# Patient Record
Sex: Male | Born: 1960 | Race: White | Hispanic: No | Smoking: Never smoker
Health system: Southern US, Community
[De-identification: ages and names within clinical notes are randomized; demographics above are authoritative.]

## PROBLEM LIST (undated history)

## (undated) DIAGNOSIS — IMO0001 Reserved for inherently not codable concepts without codable children: Secondary | ICD-10-CM

## (undated) DIAGNOSIS — I10 Essential (primary) hypertension: Secondary | ICD-10-CM

## (undated) DIAGNOSIS — L02512 Cutaneous abscess of left hand: Secondary | ICD-10-CM

## (undated) DIAGNOSIS — S68119A Complete traumatic metacarpophalangeal amputation of unspecified finger, initial encounter: Secondary | ICD-10-CM

## (undated) DIAGNOSIS — M659 Synovitis and tenosynovitis, unspecified: Secondary | ICD-10-CM

## (undated) DIAGNOSIS — M1990 Unspecified osteoarthritis, unspecified site: Secondary | ICD-10-CM

## (undated) DIAGNOSIS — R7881 Bacteremia: Secondary | ICD-10-CM

## (undated) DIAGNOSIS — E538 Deficiency of other specified B group vitamins: Secondary | ICD-10-CM

## (undated) DIAGNOSIS — E119 Type 2 diabetes mellitus without complications: Secondary | ICD-10-CM

## (undated) DIAGNOSIS — I639 Cerebral infarction, unspecified: Secondary | ICD-10-CM

## (undated) DIAGNOSIS — M869 Osteomyelitis, unspecified: Secondary | ICD-10-CM

## (undated) DIAGNOSIS — R011 Cardiac murmur, unspecified: Secondary | ICD-10-CM

## (undated) HISTORY — PX: TONSILLECTOMY: SUR1361

## (undated) HISTORY — DX: Complete traumatic metacarpophalangeal amputation of unspecified finger, initial encounter: S68.119A

## (undated) HISTORY — DX: Deficiency of other specified B group vitamins: E53.8

## (undated) HISTORY — DX: Bacteremia: R78.81

## (undated) HISTORY — DX: Reserved for inherently not codable concepts without codable children: IMO0001

## (undated) HISTORY — PX: CATARACT EXTRACTION W/ INTRAOCULAR LENS  IMPLANT, BILATERAL: SHX1307

## (undated) HISTORY — DX: Cutaneous abscess of left hand: L02.512

## (undated) HISTORY — DX: Synovitis and tenosynovitis, unspecified: M65.9

---
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
## 1998-04-07 ENCOUNTER — Encounter: Payer: Self-pay | Admitting: Internal Medicine

## 1998-04-07 ENCOUNTER — Inpatient Hospital Stay (HOSPITAL_COMMUNITY): Admission: EM | Admit: 1998-04-07 | Discharge: 1998-04-14 | Payer: Self-pay | Admitting: Internal Medicine

## 1998-04-08 ENCOUNTER — Encounter: Payer: Self-pay | Admitting: *Deleted

## 1998-04-14 ENCOUNTER — Inpatient Hospital Stay (HOSPITAL_COMMUNITY)
Admission: RE | Admit: 1998-04-14 | Discharge: 1998-05-05 | Payer: Self-pay | Admitting: Physical Medicine and Rehabilitation

## 1998-05-07 ENCOUNTER — Encounter
Admission: RE | Admit: 1998-05-07 | Discharge: 1998-08-05 | Payer: Self-pay | Admitting: Physical Medicine and Rehabilitation

## 1998-08-02 ENCOUNTER — Encounter
Admission: RE | Admit: 1998-08-02 | Discharge: 1998-10-31 | Payer: Self-pay | Admitting: Physical Medicine and Rehabilitation

## 1998-12-20 ENCOUNTER — Encounter: Admission: RE | Admit: 1998-12-20 | Discharge: 1999-03-20 | Payer: Self-pay | Admitting: *Deleted

## 1999-04-25 ENCOUNTER — Encounter: Admission: RE | Admit: 1999-04-25 | Discharge: 1999-07-24 | Payer: Self-pay | Admitting: *Deleted

## 1999-06-21 ENCOUNTER — Encounter: Payer: Self-pay | Admitting: *Deleted

## 1999-06-21 ENCOUNTER — Inpatient Hospital Stay (HOSPITAL_COMMUNITY): Admission: AD | Admit: 1999-06-21 | Discharge: 1999-06-22 | Payer: Self-pay | Admitting: *Deleted

## 2000-06-28 ENCOUNTER — Ambulatory Visit (HOSPITAL_COMMUNITY): Admission: RE | Admit: 2000-06-28 | Discharge: 2000-06-28 | Payer: Self-pay | Admitting: *Deleted

## 2000-12-19 ENCOUNTER — Encounter: Admission: RE | Admit: 2000-12-19 | Discharge: 2000-12-19 | Payer: Self-pay | Admitting: *Deleted

## 2000-12-19 ENCOUNTER — Encounter: Payer: Self-pay | Admitting: *Deleted

## 2004-07-19 ENCOUNTER — Emergency Department (HOSPITAL_COMMUNITY): Admission: AC | Admit: 2004-07-19 | Discharge: 2004-07-19 | Payer: Self-pay

## 2012-03-06 DIAGNOSIS — H3500 Unspecified background retinopathy: Secondary | ICD-10-CM | POA: Diagnosis not present

## 2012-03-06 DIAGNOSIS — E119 Type 2 diabetes mellitus without complications: Secondary | ICD-10-CM | POA: Diagnosis not present

## 2012-03-06 DIAGNOSIS — Z961 Presence of intraocular lens: Secondary | ICD-10-CM | POA: Diagnosis not present

## 2012-12-03 ENCOUNTER — Encounter (HOSPITAL_COMMUNITY): Payer: Self-pay | Admitting: Emergency Medicine

## 2012-12-03 ENCOUNTER — Emergency Department (HOSPITAL_COMMUNITY): Payer: Medicare Other

## 2012-12-03 ENCOUNTER — Inpatient Hospital Stay (HOSPITAL_COMMUNITY)
Admission: EM | Admit: 2012-12-03 | Discharge: 2012-12-12 | DRG: 854 | Disposition: A | Discharge status: Skilled Nursing Facility | Payer: Medicare Other | Attending: Family Medicine | Admitting: Family Medicine

## 2012-12-03 DIAGNOSIS — M719 Bursopathy, unspecified: Secondary | ICD-10-CM | POA: Diagnosis not present

## 2012-12-03 DIAGNOSIS — L03818 Cellulitis of other sites: Secondary | ICD-10-CM | POA: Diagnosis not present

## 2012-12-03 DIAGNOSIS — S61209A Unspecified open wound of unspecified finger without damage to nail, initial encounter: Secondary | ICD-10-CM | POA: Diagnosis not present

## 2012-12-03 DIAGNOSIS — T360X5A Adverse effect of penicillins, initial encounter: Secondary | ICD-10-CM | POA: Diagnosis present

## 2012-12-03 DIAGNOSIS — E119 Type 2 diabetes mellitus without complications: Secondary | ICD-10-CM

## 2012-12-03 DIAGNOSIS — Z1810 Retained metal fragments, unspecified: Secondary | ICD-10-CM

## 2012-12-03 DIAGNOSIS — R000 Tachycardia, unspecified: Secondary | ICD-10-CM | POA: Diagnosis present

## 2012-12-03 DIAGNOSIS — S6990XA Unspecified injury of unspecified wrist, hand and finger(s), initial encounter: Secondary | ICD-10-CM | POA: Diagnosis not present

## 2012-12-03 DIAGNOSIS — Z79899 Other long term (current) drug therapy: Secondary | ICD-10-CM

## 2012-12-03 DIAGNOSIS — I6990 Unspecified sequelae of unspecified cerebrovascular disease: Secondary | ICD-10-CM

## 2012-12-03 DIAGNOSIS — B964 Proteus (mirabilis) (morganii) as the cause of diseases classified elsewhere: Secondary | ICD-10-CM | POA: Diagnosis present

## 2012-12-03 DIAGNOSIS — E118 Type 2 diabetes mellitus with unspecified complications: Secondary | ICD-10-CM

## 2012-12-03 DIAGNOSIS — F1010 Alcohol abuse, uncomplicated: Secondary | ICD-10-CM | POA: Diagnosis present

## 2012-12-03 DIAGNOSIS — L02512 Cutaneous abscess of left hand: Secondary | ICD-10-CM

## 2012-12-03 DIAGNOSIS — M6790 Unspecified disorder of synovium and tendon, unspecified site: Secondary | ICD-10-CM | POA: Diagnosis not present

## 2012-12-03 DIAGNOSIS — L03019 Cellulitis of unspecified finger: Secondary | ICD-10-CM | POA: Diagnosis present

## 2012-12-03 DIAGNOSIS — I69920 Aphasia following unspecified cerebrovascular disease: Secondary | ICD-10-CM

## 2012-12-03 DIAGNOSIS — R4701 Aphasia: Secondary | ICD-10-CM | POA: Diagnosis present

## 2012-12-03 DIAGNOSIS — M7150 Other bursitis, not elsewhere classified, unspecified site: Secondary | ICD-10-CM | POA: Diagnosis not present

## 2012-12-03 DIAGNOSIS — Z7982 Long term (current) use of aspirin: Secondary | ICD-10-CM

## 2012-12-03 DIAGNOSIS — M009 Pyogenic arthritis, unspecified: Secondary | ICD-10-CM | POA: Diagnosis not present

## 2012-12-03 DIAGNOSIS — M65839 Other synovitis and tenosynovitis, unspecified forearm: Secondary | ICD-10-CM | POA: Diagnosis present

## 2012-12-03 DIAGNOSIS — R29898 Other symptoms and signs involving the musculoskeletal system: Secondary | ICD-10-CM | POA: Diagnosis present

## 2012-12-03 DIAGNOSIS — L270 Generalized skin eruption due to drugs and medicaments taken internally: Secondary | ICD-10-CM | POA: Diagnosis present

## 2012-12-03 DIAGNOSIS — S6980XA Other specified injuries of unspecified wrist, hand and finger(s), initial encounter: Secondary | ICD-10-CM | POA: Diagnosis not present

## 2012-12-03 DIAGNOSIS — I69998 Other sequelae following unspecified cerebrovascular disease: Secondary | ICD-10-CM

## 2012-12-03 DIAGNOSIS — F840 Autistic disorder: Secondary | ICD-10-CM | POA: Diagnosis present

## 2012-12-03 DIAGNOSIS — IMO0001 Reserved for inherently not codable concepts without codable children: Secondary | ICD-10-CM | POA: Diagnosis present

## 2012-12-03 DIAGNOSIS — E1165 Type 2 diabetes mellitus with hyperglycemia: Secondary | ICD-10-CM | POA: Diagnosis present

## 2012-12-03 DIAGNOSIS — M7989 Other specified soft tissue disorders: Secondary | ICD-10-CM | POA: Diagnosis not present

## 2012-12-03 DIAGNOSIS — T50905A Adverse effect of unspecified drugs, medicaments and biological substances, initial encounter: Secondary | ICD-10-CM

## 2012-12-03 DIAGNOSIS — L03114 Cellulitis of left upper limb: Secondary | ICD-10-CM

## 2012-12-03 DIAGNOSIS — I10 Essential (primary) hypertension: Secondary | ICD-10-CM | POA: Diagnosis present

## 2012-12-03 DIAGNOSIS — S61409A Unspecified open wound of unspecified hand, initial encounter: Secondary | ICD-10-CM | POA: Diagnosis present

## 2012-12-03 DIAGNOSIS — E785 Hyperlipidemia, unspecified: Secondary | ICD-10-CM | POA: Diagnosis present

## 2012-12-03 DIAGNOSIS — M6281 Muscle weakness (generalized): Secondary | ICD-10-CM | POA: Diagnosis present

## 2012-12-03 DIAGNOSIS — R29810 Facial weakness: Secondary | ICD-10-CM | POA: Diagnosis present

## 2012-12-03 DIAGNOSIS — Y9210 Unspecified residential institution as the place of occurrence of the external cause: Secondary | ICD-10-CM | POA: Diagnosis present

## 2012-12-03 DIAGNOSIS — L02519 Cutaneous abscess of unspecified hand: Secondary | ICD-10-CM | POA: Diagnosis present

## 2012-12-03 DIAGNOSIS — I498 Other specified cardiac arrhythmias: Secondary | ICD-10-CM | POA: Diagnosis not present

## 2012-12-03 DIAGNOSIS — R634 Abnormal weight loss: Secondary | ICD-10-CM | POA: Diagnosis present

## 2012-12-03 DIAGNOSIS — M659 Synovitis and tenosynovitis, unspecified: Secondary | ICD-10-CM | POA: Diagnosis present

## 2012-12-03 DIAGNOSIS — L089 Local infection of the skin and subcutaneous tissue, unspecified: Secondary | ICD-10-CM

## 2012-12-03 DIAGNOSIS — X58XXXA Exposure to other specified factors, initial encounter: Secondary | ICD-10-CM

## 2012-12-03 DIAGNOSIS — A419 Sepsis, unspecified organism: Principal | ICD-10-CM | POA: Diagnosis present

## 2012-12-03 DIAGNOSIS — M65849 Other synovitis and tenosynovitis, unspecified hand: Secondary | ICD-10-CM | POA: Diagnosis present

## 2012-12-03 DIAGNOSIS — L02818 Cutaneous abscess of other sites: Secondary | ICD-10-CM | POA: Diagnosis not present

## 2012-12-03 HISTORY — DX: Type 2 diabetes mellitus without complications: E11.9

## 2012-12-03 HISTORY — DX: Cerebral infarction, unspecified: I63.9

## 2012-12-03 LAB — COMPREHENSIVE METABOLIC PANEL
AST: 9 U/L (ref 0–37)
Albumin: 2.8 g/dL — ABNORMAL LOW (ref 3.5–5.2)
Alkaline Phosphatase: 114 U/L (ref 39–117)
BUN: 16 mg/dL (ref 6–23)
BUN: 12 mg/dL (ref 6–23)
Calcium: 8.9 mg/dL (ref 8.4–10.5)
Chloride: 97 mEq/L (ref 96–112)
Chloride: 100 mEq/L (ref 96–112)
Creatinine, Ser: 1.02 mg/dL (ref 0.50–1.35)
Creatinine, Ser: 0.72 mg/dL (ref 0.50–1.35)
GFR calc Af Amer: >90 mL/min (ref >90)
GFR calc non Af Amer: 83 mL/min — ABNORMAL LOW (ref >90)
Glucose, Bld: 410 mg/dL — ABNORMAL HIGH (ref 70–99)
Potassium: 3.9 mEq/L (ref 3.5–5.1)
Total Bilirubin: 0.3 mg/dL (ref 0.3–1.2)
Total Bilirubin: 0.3 mg/dL (ref 0.3–1.2)
Total Protein: 6.3 g/dL (ref 6.0–8.3)

## 2012-12-03 LAB — CBC
Platelets: 234 10*3/uL (ref 150–400)
RBC: 4.04 MIL/uL — ABNORMAL LOW (ref 4.22–5.81)
RDW: 12.8 % (ref 11.5–15.5)
WBC: 10.8 10*3/uL — ABNORMAL HIGH (ref 4.0–10.5)

## 2012-12-03 LAB — CBC WITH DIFFERENTIAL/PLATELET
Basophils Absolute: 0 10*3/uL (ref 0.0–0.1)
Basophils Relative: 0 % (ref 0–1)
Eosinophils Relative: 1 % (ref 0–5)
HCT: 39.5 % (ref 39.0–52.0)
Hemoglobin: 13.8 g/dL (ref 13.0–17.0)
MCH: 29.7 pg (ref 26.0–34.0)
MCHC: 34.9 g/dL (ref 30.0–36.0)
MCV: 85.1 fL (ref 78.0–100.0)
Monocytes Absolute: 0.9 10*3/uL (ref 0.1–1.0)
Monocytes Relative: 11 % (ref 3–12)
Neutro Abs: 5.6 10*3/uL (ref 1.7–7.7)
RDW: 12.7 % (ref 11.5–15.5)

## 2012-12-03 LAB — SEDIMENTATION RATE: Sed Rate: 70 mm/hr — ABNORMAL HIGH (ref 0–16)

## 2012-12-03 LAB — CG4 I-STAT (LACTIC ACID): Lactic Acid, Venous: 1.64 mmol/L (ref 0.5–2.2)

## 2012-12-03 MED ORDER — ACETAMINOPHEN 325 MG PO TABS
650.0000 mg | ORAL_TABLET | Freq: Four times a day (QID) | ORAL | Status: DC | PRN
  Administered 2012-12-03: 650 mg via ORAL
  Filled 2012-12-03: qty 2

## 2012-12-03 MED ORDER — LORAZEPAM 1 MG PO TABS: 1.0000 mg | ORAL_TABLET | Freq: Four times a day (QID) | ORAL | Status: AC | PRN

## 2012-12-03 MED ORDER — ACETAMINOPHEN 650 MG RE SUPP: 650.0000 mg | Freq: Four times a day (QID) | RECTAL | Status: DC | PRN

## 2012-12-03 MED ORDER — INSULIN ASPART 100 UNIT/ML ~~LOC~~ SOLN
10.0000 [IU] | Freq: Once | SUBCUTANEOUS | Status: AC
  Administered 2012-12-03: 10 [IU] via INTRAVENOUS
  Filled 2012-12-03: qty 1

## 2012-12-03 MED ORDER — THIAMINE HCL 100 MG/ML IJ SOLN
100.0000 mg | Freq: Every day | INTRAMUSCULAR | Status: DC
  Filled 2012-12-03 (×5): qty 1

## 2012-12-03 MED ORDER — LACTATED RINGERS IV SOLN: INTRAVENOUS | Status: DC

## 2012-12-03 MED ORDER — LORAZEPAM 2 MG/ML IJ SOLN: 1.0000 mg | Freq: Four times a day (QID) | INTRAMUSCULAR | Status: AC | PRN

## 2012-12-03 MED ORDER — VANCOMYCIN HCL IN DEXTROSE 1-5 GM/200ML-% IV SOLN
1000.0000 mg | Freq: Once | INTRAVENOUS | Status: AC
  Administered 2012-12-03: 1000 mg via INTRAVENOUS
  Filled 2012-12-03: qty 200

## 2012-12-03 MED ORDER — SIMVASTATIN 40 MG PO TABS
40.0000 mg | ORAL_TABLET | Freq: Every evening | ORAL | Status: DC
  Administered 2012-12-03 – 2012-12-11 (×9): 40 mg via ORAL
  Filled 2012-12-03 (×10): qty 1

## 2012-12-03 MED ORDER — INSULIN ASPART 100 UNIT/ML ~~LOC~~ SOLN
0.0000 [IU] | Freq: Three times a day (TID) | SUBCUTANEOUS | Status: DC
  Administered 2012-12-04: 7 [IU] via SUBCUTANEOUS
  Administered 2012-12-04: 5 [IU] via SUBCUTANEOUS
  Administered 2012-12-05 (×2): 8 [IU] via SUBCUTANEOUS
  Administered 2012-12-05: 5 [IU] via SUBCUTANEOUS
  Administered 2012-12-06 – 2012-12-07 (×3): 3 [IU] via SUBCUTANEOUS
  Administered 2012-12-07 (×2): 5 [IU] via SUBCUTANEOUS
  Administered 2012-12-08: 3 [IU] via SUBCUTANEOUS
  Administered 2012-12-08: 5 [IU] via SUBCUTANEOUS
  Administered 2012-12-08: 2 [IU] via SUBCUTANEOUS
  Administered 2012-12-09: 3 [IU] via SUBCUTANEOUS
  Administered 2012-12-09: 2 [IU] via SUBCUTANEOUS
  Administered 2012-12-09: 5 [IU] via SUBCUTANEOUS
  Administered 2012-12-10 – 2012-12-11 (×4): 3 [IU] via SUBCUTANEOUS
  Administered 2012-12-11 – 2012-12-12 (×2): 2 [IU] via SUBCUTANEOUS
  Administered 2012-12-12: 3 [IU] via SUBCUTANEOUS

## 2012-12-03 MED ORDER — SODIUM CHLORIDE 0.9 % IV BOLUS (SEPSIS): 1000.0000 mL | Freq: Once | INTRAVENOUS | Status: DC

## 2012-12-03 MED ORDER — CEFAZOLIN SODIUM 1-5 GM-% IV SOLN
1.0000 g | Freq: Once | INTRAVENOUS | Status: AC
  Administered 2012-12-03: 1 g via INTRAVENOUS
  Filled 2012-12-03: qty 50

## 2012-12-03 MED ORDER — SODIUM CHLORIDE 0.9 % IV SOLN
1000.0000 mL | INTRAVENOUS | Status: DC
  Administered 2012-12-03 – 2012-12-06 (×8): 1000 mL via INTRAVENOUS

## 2012-12-03 MED ORDER — VANCOMYCIN HCL IN DEXTROSE 1-5 GM/200ML-% IV SOLN
1000.0000 mg | Freq: Two times a day (BID) | INTRAVENOUS | Status: DC
  Administered 2012-12-04 – 2012-12-06 (×6): 1000 mg via INTRAVENOUS
  Filled 2012-12-03 (×8): qty 200

## 2012-12-03 MED ORDER — ASPIRIN 325 MG PO TABS
325.0000 mg | ORAL_TABLET | Freq: Every day | ORAL | Status: DC
  Administered 2012-12-05 – 2012-12-12 (×8): 325 mg via ORAL
  Filled 2012-12-03 (×9): qty 1

## 2012-12-03 MED ORDER — VITAMIN B-1 100 MG PO TABS
100.0000 mg | ORAL_TABLET | Freq: Every day | ORAL | Status: DC
  Administered 2012-12-03 – 2012-12-12 (×9): 100 mg via ORAL
  Filled 2012-12-03 (×10): qty 1

## 2012-12-03 MED ORDER — HYDRALAZINE HCL 20 MG/ML IJ SOLN: 5.0000 mg | INTRAMUSCULAR | Status: DC | PRN

## 2012-12-03 MED ORDER — PIPERACILLIN-TAZOBACTAM 3.375 G IVPB
3.3750 g | Freq: Three times a day (TID) | INTRAVENOUS | Status: DC
  Administered 2012-12-04 – 2012-12-08 (×13): 3.375 g via INTRAVENOUS
  Filled 2012-12-03 (×15): qty 50

## 2012-12-03 MED ORDER — PIPERACILLIN-TAZOBACTAM 3.375 G IVPB 30 MIN
3.3750 g | Freq: Once | INTRAVENOUS | Status: AC
  Administered 2012-12-03: 3.375 g via INTRAVENOUS
  Filled 2012-12-03: qty 50

## 2012-12-03 MED ORDER — ADULT MULTIVITAMIN W/MINERALS CH
1.0000 | ORAL_TABLET | Freq: Every day | ORAL | Status: DC
  Administered 2012-12-03 – 2012-12-12 (×9): 1 via ORAL
  Filled 2012-12-03 (×10): qty 1

## 2012-12-03 MED ORDER — SODIUM CHLORIDE 0.9 % IV SOLN
1000.0000 mL | Freq: Once | INTRAVENOUS | Status: AC
  Administered 2012-12-03: 1000 mL via INTRAVENOUS

## 2012-12-03 MED ORDER — FOLIC ACID 1 MG PO TABS
1.0000 mg | ORAL_TABLET | Freq: Every day | ORAL | Status: DC
  Administered 2012-12-03 – 2012-12-12 (×9): 1 mg via ORAL
  Filled 2012-12-03 (×10): qty 1

## 2012-12-03 MED ORDER — TETANUS-DIPHTH-ACELL PERTUSSIS 5-2.5-18.5 LF-MCG/0.5 IM SUSP
0.5000 mL | Freq: Once | INTRAMUSCULAR | Status: AC
  Administered 2012-12-03: 0.5 mL via INTRAMUSCULAR
  Filled 2012-12-03: qty 0.5

## 2012-12-03 NOTE — ED Notes (Signed)
Pt c/o left hand pain and swelling; redness noted with open wounds; pt with rings on all fingers and unsure if can be removed; foul odor noted

## 2012-12-03 NOTE — ED Provider Notes (Addendum)
History     CSN: [id number]  Arrival date & time 12/03/12  1347   First MD Initiated Contact with Patient 12/03/12 1523      Chief Complaint  Patient presents with  . Finger Injury    (Consider location/radiation/quality/duration/timing/severity/associated sxs/prior treatment) The history is provided by the patient.   52 year old male comes in because of problems with rings on his left hand. Over the last 2 months, he has had swelling of his fingers and has been unable to remove his rings. There has been drainage from the fingers and from the hand as well as swelling of the hand and he has noted a foul odor. He states there is no pain and he denies fever, chills, sweats. He states that over the past several years, there has been intermittent swelling of his hands and frequently alternating sides. As the first time that his fingers swelled and stayed swollen. He does not know when his last tetanus immunization was.  Past Medical History  Diagnosis Date  . Stroke     History reviewed. No pertinent past surgical history.  History reviewed. No pertinent family history.  History  Substance Use Topics  . Smoking status: Never Smoker   . Smokeless tobacco: Not on file  . Alcohol Use: Yes      Review of Systems  All other systems reviewed and are negative.    Allergies  Review of patient's allergies indicates no known allergies.  Home Medications   Current Outpatient Rx  Name  Route  Sig  Dispense  Refill  . aspirin 325 MG tablet   Oral   Take 325 mg by mouth daily.         . simvastatin (ZOCOR) 40 MG tablet   Oral   Take 40 mg by mouth every evening.         . vitamin B-12 (CYANOCOBALAMIN) 100 MCG tablet   Oral   Take 50 mcg by mouth daily.           BP 128/85  Pulse 143  Temp(Src) 100.2 F (37.9 C) (Oral)  Resp 18  SpO2 99%  Physical Exam  Nursing note and vitals reviewed.  52 year old male, resting comfortably and in no acute distress. Vital  signs are significant for tachycardia with heart rate of 143, and fever with temperature 100.2. Oxygen saturation is 99%, which is normal. Head is normocephalic and atraumatic. PERRLA, EOMI. Oropharynx is clear. Neck is nontender and supple without adenopathy or JVD. Back is nontender and there is no CVA tenderness. Lungs are clear without rales, wheezes, or rhonchi. Chest is nontender. Heart has regular rate and rhythm without murmur. Abdomen is soft, flat, nontender without masses or hepatosplenomegaly and peristalsis is normoactive. Extremities: There is moderate swelling of the dorsum of the left hand with mild erythema. Rings are present on his left second, third, fourth, fifth fingers. There is severe swelling of the fingers both proximal and distal to the rings. There is gross. And drainage from the flexor surface of the middle phalanx of the fourth finger. There is a foul odor throughout the area. Some swelling extends to the palm of the hand. On the second finger, the ring has gained below the skin level and there is actually reepithelialization over the ring so that it is going to return on the flexor surface. No other extremity injuries are seen and there is no peripheral edema except in his left hand. Skin is warm and dry without rash. Neurologic: Mental  status is normal, cranial nerves are intact, there are no motor or sensory deficits.  ED Course  Procedures (including critical care time) Procedure: Removal of ring from left fifth finger. After informed consent was obtained, a timeout was performed. The ring of the fifth finger was removed with the aid of a ring cutter. However, I was not able to maneuver the ring cutter into position to remove the rings from the second, third, or fourth fingers. Patient tolerated procedure well.  Results for orders placed during the hospital encounter of 12/03/12  CBC WITH DIFFERENTIAL      Result Value Range   WBC 7.9  4.0 - 10.5 K/uL   RBC 4.64   4.22 - 5.81 MIL/uL   Hemoglobin 13.8  13.0 - 17.0 g/dL   HCT 16.1  09.6 - 04.5 %   MCV 85.1  78.0 - 100.0 fL   MCH 29.7  26.0 - 34.0 pg   MCHC 34.9  30.0 - 36.0 g/dL   RDW 40.9  81.1 - 91.4 %   Platelets 222  150 - 400 K/uL   Neutrophils Relative % 71  43 - 77 %   Neutro Abs 5.6  1.7 - 7.7 K/uL   Lymphocytes Relative 17  12 - 46 %   Lymphs Abs 1.3  0.7 - 4.0 K/uL   Monocytes Relative 11  3 - 12 %   Monocytes Absolute 0.9  0.1 - 1.0 K/uL   Eosinophils Relative 1  0 - 5 %   Eosinophils Absolute 0.1  0.0 - 0.7 K/uL   Basophils Relative 0  0 - 1 %   Basophils Absolute 0.0  0.0 - 0.1 K/uL  COMPREHENSIVE METABOLIC PANEL      Result Value Range   Sodium 134 (*) 135 - 145 mEq/L   Potassium 4.3  3.5 - 5.1 mEq/L   Chloride 97  96 - 112 mEq/L   CO2 21  19 - 32 mEq/L   Glucose, Bld 523 (*) 70 - 99 mg/dL   BUN 16  6 - 23 mg/dL   Creatinine, Ser 7.82  0.50 - 1.35 mg/dL   Calcium 8.9  8.4 - 95.6 mg/dL   Total Protein 7.4  6.0 - 8.3 g/dL   Albumin 2.8 (*) 3.5 - 5.2 g/dL   AST 9  0 - 37 U/L   ALT 9  0 - 53 U/L   Alkaline Phosphatase 131 (*) 39 - 117 U/L   Total Bilirubin 0.3  0.3 - 1.2 mg/dL   GFR calc non Af Amer 83 (*) >90 mL/min   GFR calc Af Amer >90  >90 mL/min  SEDIMENTATION RATE      Result Value Range   Sed Rate 70 (*) 0 - 16 mm/hr  CG4 I-STAT (LACTIC ACID)      Result Value Range   Lactic Acid, Venous 1.64  0.5 - 2.2 mmol/L   Dg Hand Complete Left  12/03/2012   *RADIOLOGY REPORT*  Clinical Data: Finger injury  LEFT HAND - COMPLETE 3+ VIEW  Comparison: None.  Findings: Three views of the left hand submitted.  No acute fracture or subluxation.  The study is markedly limited by metallic rings on the second third and fourth finger.  Soft tissue swelling is noted mid and distal aspect second third and fourth finger.  IMPRESSION: No acute fracture or subluxation.  Soft tissue swelling mid and distal aspect second third and fourth finger.  Limited study by metallic rings.   Original  Report Authenticated By: Natasha Mead, M.D.   Images viewed by me.   Date: 12/03/2012  Rate: 123  Rhythm: sinus tachycardia  QRS Axis: normal  Intervals: normal  ST/T Wave abnormalities: nonspecific T wave changes  Conduction Disutrbances:none  Narrative Interpretation: Sinus tachycardia, counterclockwise rotation of the heart, nonspecific T wave changes diffusely. No prior ECG available for comparison.  Old EKG Reviewed: none available    1. Diabetes mellitus, new onset   2. Abscess of finger, left   3. Cellulitis of hand, left   4. Foreign body of finger with infection, initial encounter    CRITICAL CARE Performed by: UJWJX,BJYNW Total critical care time: 35  minutes Critical care time was exclusive of separately billable procedures and treating other patients. Critical care was necessary to treat or prevent imminent or life-threatening deterioration. Critical care was time spent personally by me on the following activities: development of treatment plan with patient and/or surrogate as well as nursing, discussions with consultants, evaluation of patient's response to treatment, examination of patient, obtaining history from patient or surrogate, ordering and performing treatments and interventions, ordering and review of laboratory studies, ordering and review of radiographic studies, pulse oximetry and re-evaluation of patient's condition.   MDM  Cellulitis and abscess involving the fingers of the left hand and probably involving the hand itself with rings that are constricting the fingers. These will need to be removed in the operating room and he will clearly need an decision and drainage of the fingers and probably into the hand. Hand surgery is being consulted. He will be given TdAP booster and a dose of cefazolin is given.  Laboratory workup has come back. He has new-onset diabetes mellitus with blood sugar over 500 without evidence of ketoacidosis. Dr. Janee Morn of hand surgery  will come and evaluate the patient in interspace taken to the operating room tomorrow. Family practice service has been consulted to admit the patient for management of his new-onset diabetes. He has been given insulin and an IV fluid bolus. The lactic acid level is normal. X-ray shows no definite evidence of osteomyelitis but the part of bone likely to be affected is obscured by his rings. May need to consider MRI scan once rings have been successfully removed.      Dione Booze, MD 12/03/12 1730  Dione Booze, MD 12/03/12 [phone]

## 2012-12-03 NOTE — Progress Notes (Signed)
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 No results found for this basename: VANCOTROUGH, VANCOPEAK, VANCORANDOM, GENTTROUGH, GENTPEAK, GENTRANDOM, TOBRATROUGH, TOBRAPEAK, TOBRARND, AMIKACINPEAK, AMIKACINTROU, AMIKACIN,  in the last 72 hours   Microbiology: No results found for this or any previous visit (from the past 720 hour(s)).  Medical History: Past Medical History  Diagnosis Date  . 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 Apparently has diabetes, but doesn't take any meds for this. WBC 7.9, Renal function ok with Scr 1.02, Tmax 100.7, Ortho to take to OR tomm for debridement/removal of several rings from the finger, uncertain of salvageability of fingers.   Goal of Therapy:  Vancomycin trough level 15-20 mcg/ml  Plan:  -Start vancomycin 1000mg  IV q12h -Start Zosyn 3.375G IV q8h to be infused over 4 hours (first dose 30 minutes) -Trend WBC, temp, renal function -f/u after OR tomorrow -f/u micro data -Drug levels when indicated  Abran Duke, PharmD Clinical Pharmacist Phone: [phone] Pager: [phone] 12/03/2012 8:16 PM

## 2012-12-03 NOTE — Consult Note (Signed)
ORTHOPAEDIC CONSULTATION  REQUESTING PHYSICIAN: Dione Booze, MD  Chief Complaint: left hand infection  HPI: Stephen Fitzgerald is a 52 y.o. male who presented to the ED today with a rather bizarre history.  He had large rings on multiple fingers, the right-sided ones have been removed by the ED staff.  However, on the left side, the digits are swollen, RF drains frank pus, and the volar aspect of the rings are virtually internalized into the digit.  He had an terminal carotid stroke in 1999 which has affected his ability to communicate somewhat and perhaps other cerebral functions. He has not seen a doctor in quite some time. He also developed diabetes when he had the stroke. He has not had any medical management for this either recently.    He reports that the swelling began a couple of months ago, he developed some blisters on the fingers and some leaking as long ago as March. Today's first saw care for this problem. He is accompanied in the emergency department by his father who is also a source of some of the medical history.  Past Medical History  Diagnosis Date  . Stroke    History reviewed. No pertinent past surgical history. History   Social History  . Marital Status: Divorced    Spouse Name: N/A    Number of Children: N/A  . Years of Education: N/A   Social History Main Topics  . Smoking status: Never Smoker   . Smokeless tobacco: None  . Alcohol Use: Yes  . Drug Use: No  . Sexually Active: None   Other Topics Concern  . None   Social History Narrative  . None   History reviewed. No pertinent family history. No Known Allergies Prior to Admission medications   Medication Sig Start Date End Date Taking? Authorizing Provider  aspirin 325 MG tablet Take 325 mg by mouth daily.   Yes Historical Provider, MD  simvastatin (ZOCOR) 40 MG tablet Take 40 mg by mouth every evening.   Yes Historical Provider, MD  vitamin B-12 (CYANOCOBALAMIN) 100 MCG tablet Take 50 mcg by  mouth daily.   Yes Historical Provider, MD   Dg Hand Complete Left  12/03/2012   *RADIOLOGY REPORT*  Clinical Data: Finger injury  LEFT HAND - COMPLETE 3+ VIEW  Comparison: None.  Findings: Three views of the left hand submitted.  No acute fracture or subluxation.  The study is markedly limited by metallic rings on the second third and fourth finger.  Soft tissue swelling is noted mid and distal aspect second third and fourth finger.  IMPRESSION: No acute fracture or subluxation.  Soft tissue swelling mid and distal aspect second third and fourth finger.  Limited study by metallic rings.   Original Report Authenticated By: Natasha Mead, M.D.    Positive ROS: All other systems have been reviewed and were otherwise negative with the exception of those mentioned in the HPI and as above.  Physical Exam: Vitals: Refer to EMR. Constitutional:  WD, WN, NAD HEENT:  NCAT, EOMI Neuro/Psych:  Alert & oriented to person, place, and time; appropriate mood & affect Lymphatic: No generalized UE edema or lymphadenopathy Extremities / MSK:  The extremities are normal with respect to appearance, ranges of motion, joint stability, muscle strength/tone, sensation, & perfusion except as otherwise noted:   The hand is swollen mode of the dorsum, a little r level of the MCPs. A small finger is stiff and somewhat atrophic in appearance. He reports that a similar process  happened on this, but he nursed himself. The ring has already been removed from the digit. The ring, long, and index finger still have retained rings. There is frank pus draining from the ring finger. The volar aspects of the rings are invaginated into the skin and there is a lot of dried foul-smelling drainage all around the rings. Distally the digits are swollen. The digits all have poor motion. He has intact light touch sensation across all the digital tips, but it is quite altered across several, perhaps is the best on the long.  The digits are all well  perfused, pink with reasonable capillary refill.  Assessment: Left hand multiple digit infection, with frank draining pus, complicated by incomplete internalization of several rings.  Plan: I discussed these findings with the patient and with his father. History will be fairly protracted. We will begin by removing the rings in the operating room tomorrow morning, performing further evaluation and drainage procedures of the digits, and seeing how he responds. He is being admitted to the hospitalist service for medical management.  I discussed with the patient and his father that is uncertain at this point whether his fingers will survivor ultimately require amputation. If they survive, it is highly likely that their function will be poor.  Cliffton Asters Janee Morn, MD     Mobile [phone] [facility] [address] [place], [place]  [address] [phone]

## 2012-12-03 NOTE — ED Notes (Signed)
Large rings removed from right hand and 2 of 3 bracklets are also removed from forearm, with tweezers and surgilube.  Pt has no complaints.

## 2012-12-03 NOTE — ED Notes (Signed)
No new changes from previous assessment, patient reports itching to his left hand, there is notable swelling and redness with yellow drainage noted at wound site

## 2012-12-03 NOTE — H&P (Signed)
Family Medicine Teaching [facility] Admission History and Physical Service Pager: [phone]  Patient name: Stephen Fitzgerald Medical record number: [id number] Date of birth: November 09, 1960 Age: 52 y.o. Gender: male  Primary Care Provider: No primary provider on file.- no PCP  Chief Complaint: hand pain  Assessment and Plan: Stephen Fitzgerald is a 2 y.o. year old male with a history of untreated diabetes presenting with frank infection of his left hand and fingers from imbedded rings. Febrile to 100.7 and tachycardia present so patient meets sepsis criteria but is overall clinically well-appearing except for his localized infection. Will admit to telemetry bed.  ID: # Hand infection/sepsis: - hand surgery consulted in ED and is planning to take pt to OR tomorrow at 11am - pt has already received a dose of ancef in ER, so will not order blood cultures at this time - broad spectrum abx coverage with Vanc/Zosyn - dosed per pharmacy - anticipate need for long term IV antibiotics. Will await result of surgery tomorrow and if necessary, consult infectious disease for long term mgmt plan (IV vs po abx at d/c) -may need to consider further imaging of hand once rings are removed; will discuss with surgeon post-op for rec's  - tylenol PRN fever - IV fluids  ENDO: # Diabetes: glucose >500 on BMET today. Chronic and untreated. - check A1c - begin with moderate SSI (pt's father reports he was previously on 2 po agents, never required SQ insulin) - carb modified diet  # Hyperlipidemia - pt is on statin at home (simva 40 daily) but this is given to him by his father, not prescribed for him by a doctor - continue this dose for now - check lipid panel in AM  CARDIOVASCULAR: # Elevated BP's: - treat with prn IV hydralazine for now - anticipate need to start oral regimen after surgery  NEURO: # Hx of stroke: - continue aspirin daily  PSYCH: # Alcohol Use: reports drinking several beers per  day - will observe with CIWA protocol and prn ativan - check UDS # Behavior: - Pt exhibits some strange behaviors and does not seem to fully appreciate the severity of his infection. Has gone for months likely with frank infection of his hand, without seeking care. Does not seem to pick up on social cues and is very difficult to redirect, getting fixated on certain topics throughout conversation. This behavior along with the hx provided by his father of a neat and orderly apartment suggests possible autism spectrum disorder. Will treat patient's immediate medical needs now (hand infection and diabetes) and consult psychiatry if need arises in the future.  FEN/GI: - NS @ 125 cc/hr - carb modified diet then NPO at midnight  SOCIAL: # Dispo:  - pending clinical improvement - will likely require prolonged IV antibiotic treatment through a PICC line, so SNF placement may be necessary in light of pt's social situation # Code Status:  -did not discuss, presume full code  History of Present Illness: Stephen Fitzgerald is a 49 y.o. year old male with a history of remote CVA and untreated diabetes presenting with left hand pain.  History is limited by pt's cognitive function. Speaks very tangentially and very difficult to redirect. Unable to answer many questions. Some history was obtained from patient's father who accompanies him to the ER today.  Pt wears rings on his hands and says that some time around two months ago his hand began to swell around his rings. His left fifth finger became  very red and swollen but improved after he used alcohol to clean it. The rest of his fingers on his left hand became worse, leading him to eventually present to the ER today, at the prompting of his father. Pt says that around a year ago his right hand was similar, but by using alcohol to clean it, it improved. Patient denies fevers or chills. Had some decreased appetite this morning but says overall he has been  eating and drinking well. No diarrhea or blurry vision.   Pt's father states that pt lives alone in an apartment, does his own cooking/cleaning, pays his own bills, drives himself. Has a hx of stroke in 1999, after which he lost contact with many of his friends. Father notes that pt's mental status has not been the same since his stroke, and that pt frequently mixes up words. Pt prefers to be left alone and thus father does not see him very often, but father has been concerned about his hand for some time. Father also notes that pt's apartment is very neat and ordered, with things being put in their exact place.  Pt has a hx of diabetes but does not take any medicine for it. It is unclear how much contact he has with a primary care provider. He has been taking simvastatin 40mg  at home, but that is because his father gives pt 1/2 of his mother's pill daily. He and his father report that he stopped his diabetes medications after his prior PCP discharged him from his practice (per the father).  He is still taking ASA but pt believes that his diabetes was cured and that is why the PCP said he would no longer see him as a pt.   Pt also reports substantial weight loss without trying, but cannot give a time frame.  Father thinks it has been over the past year or so, but reports that pt tells him he is walking a lot.  He is skeptical that this would cause the amount of weight loss.   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  Father endorses that other than his word-finding/communication difficulties, he has relatively few lasting effects from his stoke in 1999 other than some very mild right sided weakness, which results in a mild gait.   Review Of Systems: difficult as patient very difficult to redirect. All ROS that were able to be obtained are documented in HPI.  There are no active problems to display for this  patient.  Past Medical History: Past Medical History  Diagnosis Date  . Stroke    Past Surgical History: None documented in chart Per father, pt has h/o cardiac cath in early 2000's without placement of stent  Home Medications: Aspirin 324mg  Simvastatin 40mg   Social History: History  Substance Use Topics  . Smoking status: Never Smoker   . Smokeless tobacco: Not on file  . Alcohol Use: Yes  Pt states he drinks multiple beers per day. Last drink was yesterday; pt first reports he drinks 1 per day but then says 10 light beers per day.  Denies drug use.  For any additional social history documentation, please refer to relevant sections of EMR.  Family History: History reviewed. No pertinent family history.  Allergies: No Known Allergies  Physical Exam: BP 128/85  Pulse 143  Temp(Src) 100.2 F (37.9 C) (Oral)  Resp 18  SpO2 99% Exam: General: No acute distress.  Talkative and very difficult to redirect. HEENT: Normocephalic, atraumatic. Tongue and lips slightly dry. Cardiovascular: Tachycardic but regular rhythm. 2/6 systolic murmur audible. Respiratory: Normal respiratory effort. Coarse breath sounds throughout, some upper airway transmitted noises.  Abdomen: soft, nontender to palpation Extremities: left hand is markedly swollen and erythematous. Large jewelry rings are present on pt's second, third, and fourth finger. The skin around the rings appears chronically irritated, and perhaps growing around/into the rings. Profuse purulent drainage weeping from around rings. Neuro: Sensation to light touch intact on fingers of left hand with mild sensory changes over his left index finger. Speech is fluent but tangential and occasionally garbled. Left sided facial droop present. Tongue protrudes midline. 5/5 hip flexion strength bilaterally. 5/5 grip on right. Movement of left digits limited. We did not walk the patient.   Labs and Imaging:  CBC:    Component Value Date/Time    WBC 7.9 12/03/2012 1503   HGB 13.8 12/03/2012 1503   HCT 39.5 12/03/2012 1503   PLT 222 12/03/2012 1503   MCV 85.1 12/03/2012 1503   NEUTROABS 5.6 12/03/2012 1503   LYMPHSABS 1.3 12/03/2012 1503   MONOABS 0.9 12/03/2012 1503   EOSABS 0.1 12/03/2012 1503   BASOSABS 0.0 12/03/2012 1503   Comprehensive Metabolic Panel:    Component Value Date/Time   NA 134* 12/03/2012 1503   K 4.3 12/03/2012 1503   CL 97 12/03/2012 1503   CO2 21 12/03/2012 1503   BUN 16 12/03/2012 1503   CREATININE 1.02 12/03/2012 1503   GLUCOSE 523* 12/03/2012 1503   CALCIUM 8.9 12/03/2012 1503   AST 9 12/03/2012 1503   ALT 9 12/03/2012 1503   ALKPHOS 131* 12/03/2012 1503   BILITOT 0.3 12/03/2012 1503   PROT 7.4 12/03/2012 1503   ALBUMIN 2.8* 12/03/2012 1503   Sed rate 70 Lactic acid 1.64  L Hand Xray: No acute fracture or subluxation. Soft tissue swelling mid and distal aspect second third and fourth finger. Limited study by metallic rings.   Levert Feinstein, MD Family Medicine PGY-1    UPPER LEVEL ADDENDUM  I have seen and examined Stephen Fitzgerald with Dr. Pollie Meyer and I agree with the above assessment/plan. I have reviewed all available data and have made any necessary changes to the above H&P.  Demetria Pore, MD PGY-3 12/03/2012, 8:30 PM

## 2012-12-03 NOTE — ED Notes (Signed)
Pt reports that he has had his rings on for 5-6 years.  States that he is unsure of why his fingers are swollen.  Pt noted to have scattered thought process.  States that his fingers started to get 'bubbles' and 'leak' in March.  Unknown when last time rings were removed.  Myself and EDP attempted to cut off rings and were unsuccessful.  Pts fingers appear to have grown over ring, unable to locate the ring to attempt to cut.  pts fingers 2-5 noted to be swollen, purple in color, draining yellow pus and have a foul odor.  No acute distress noted.  Pt reports that his hand does not hurt.

## 2012-12-04 ENCOUNTER — Encounter (HOSPITAL_COMMUNITY): Payer: Self-pay | Admitting: Anesthesiology

## 2012-12-04 ENCOUNTER — Inpatient Hospital Stay (HOSPITAL_COMMUNITY): Payer: Medicare Other | Admitting: Anesthesiology

## 2012-12-04 ENCOUNTER — Encounter (HOSPITAL_COMMUNITY)
Admission: EM | Disposition: A | Discharge status: Skilled Nursing Facility | Payer: Self-pay | Source: Home / Self Care | Attending: Family Medicine

## 2012-12-04 DIAGNOSIS — F840 Autistic disorder: Secondary | ICD-10-CM | POA: Diagnosis not present

## 2012-12-04 DIAGNOSIS — A419 Sepsis, unspecified organism: Secondary | ICD-10-CM | POA: Diagnosis not present

## 2012-12-04 DIAGNOSIS — E119 Type 2 diabetes mellitus without complications: Secondary | ICD-10-CM

## 2012-12-04 DIAGNOSIS — I6990 Unspecified sequelae of unspecified cerebrovascular disease: Secondary | ICD-10-CM | POA: Diagnosis not present

## 2012-12-04 DIAGNOSIS — L03818 Cellulitis of other sites: Secondary | ICD-10-CM | POA: Diagnosis not present

## 2012-12-04 HISTORY — PX: I&D EXTREMITY: SHX5045

## 2012-12-04 LAB — GLUCOSE, CAPILLARY
Glucose-Capillary: 238 mg/dL — ABNORMAL HIGH (ref 70–99)
Glucose-Capillary: 211 mg/dL — ABNORMAL HIGH (ref 70–99)
Glucose-Capillary: 265 mg/dL — ABNORMAL HIGH (ref 70–99)
Glucose-Capillary: 259 mg/dL — ABNORMAL HIGH (ref 70–99)

## 2012-12-04 LAB — BASIC METABOLIC PANEL
BUN: 8 mg/dL (ref 6–23)
Calcium: 8.6 mg/dL (ref 8.4–10.5)
Creatinine, Ser: 0.53 mg/dL (ref 0.50–1.35)
Glucose, Bld: 262 mg/dL — ABNORMAL HIGH (ref 70–99)
Potassium: 3.7 mEq/L (ref 3.5–5.1)
Sodium: 135 mEq/L (ref 135–145)

## 2012-12-04 LAB — CBC
HCT: 37.7 % — ABNORMAL LOW (ref 39.0–52.0)
MCH: 28.7 pg (ref 26.0–34.0)
MCHC: 33.7 g/dL (ref 30.0–36.0)
MCV: 85.3 fL (ref 78.0–100.0)
RDW: 12.9 % (ref 11.5–15.5)
WBC: 11.3 10*3/uL — ABNORMAL HIGH (ref 4.0–10.5)

## 2012-12-04 LAB — SURGICAL PCR SCREEN
MRSA, PCR: NEGATIVE
Staphylococcus aureus: POSITIVE — AB

## 2012-12-04 LAB — RAPID URINE DRUG SCREEN, HOSP PERFORMED
Amphetamines: NOT DETECTED
Cocaine: NOT DETECTED
Opiates: NOT DETECTED

## 2012-12-04 SURGERY — IRRIGATION AND DEBRIDEMENT EXTREMITY
Anesthesia: General | Site: Hand | Laterality: Left | Wound class: Dirty or Infected

## 2012-12-04 MED ORDER — OXYCODONE HCL 5 MG PO TABS
ORAL_TABLET | ORAL | Status: AC
  Filled 2012-12-04: qty 1

## 2012-12-04 MED ORDER — ONDANSETRON HCL 4 MG/2ML IJ SOLN
INTRAMUSCULAR | Status: DC | PRN
  Administered 2012-12-04: 4 mg via INTRAVENOUS

## 2012-12-04 MED ORDER — OXYCODONE HCL 5 MG PO TABS
5.0000 mg | ORAL_TABLET | Freq: Once | ORAL | Status: AC | PRN
  Administered 2012-12-04: 5 mg via ORAL

## 2012-12-04 MED ORDER — FENTANYL CITRATE 0.05 MG/ML IJ SOLN
INTRAMUSCULAR | Status: DC | PRN
  Administered 2012-12-04 (×2): 50 ug via INTRAVENOUS
  Administered 2012-12-04: 100 ug via INTRAVENOUS
  Administered 2012-12-04 (×2): 50 ug via INTRAVENOUS

## 2012-12-04 MED ORDER — METOPROLOL TARTRATE 25 MG PO TABS
25.0000 mg | ORAL_TABLET | Freq: Two times a day (BID) | ORAL | Status: DC
  Administered 2012-12-04 – 2012-12-12 (×16): 25 mg via ORAL
  Filled 2012-12-04 (×17): qty 1

## 2012-12-04 MED ORDER — MIDAZOLAM HCL 5 MG/5ML IJ SOLN
INTRAMUSCULAR | Status: DC | PRN
  Administered 2012-12-04: 2 mg via INTRAVENOUS

## 2012-12-04 MED ORDER — MIDAZOLAM HCL 2 MG/2ML IJ SOLN: 0.5000 mg | Freq: Once | INTRAMUSCULAR | Status: AC | PRN

## 2012-12-04 MED ORDER — PROPOFOL 10 MG/ML IV BOLUS
INTRAVENOUS | Status: DC | PRN
  Administered 2012-12-04: 200 mg via INTRAVENOUS

## 2012-12-04 MED ORDER — PROMETHAZINE HCL 25 MG/ML IJ SOLN
6.2500 mg | INTRAMUSCULAR | Status: DC | PRN
  Filled 2012-12-04: qty 1

## 2012-12-04 MED ORDER — CHLORHEXIDINE GLUCONATE CLOTH 2 % EX PADS
6.0000 | MEDICATED_PAD | Freq: Every day | CUTANEOUS | Status: AC
  Administered 2012-12-04 – 2012-12-08 (×5): 6 via TOPICAL

## 2012-12-04 MED ORDER — OXYCODONE HCL 5 MG/5ML PO SOLN: 5.0000 mg | Freq: Once | ORAL | Status: AC | PRN

## 2012-12-04 MED ORDER — LIDOCAINE HCL (CARDIAC) 20 MG/ML IV SOLN
INTRAVENOUS | Status: DC | PRN
  Administered 2012-12-04: 30 mg via INTRAVENOUS

## 2012-12-04 MED ORDER — PHENYLEPHRINE HCL 10 MG/ML IJ SOLN
INTRAMUSCULAR | Status: DC | PRN
  Administered 2012-12-04 (×4): 80 ug via INTRAVENOUS

## 2012-12-04 MED ORDER — HYDROMORPHONE HCL PF 1 MG/ML IJ SOLN: 1.0000 mg | INTRAMUSCULAR | Status: DC | PRN

## 2012-12-04 MED ORDER — HYDROMORPHONE HCL PF 1 MG/ML IJ SOLN
0.2500 mg | INTRAMUSCULAR | Status: DC | PRN
  Administered 2012-12-04 (×3): 0.5 mg via INTRAVENOUS
  Filled 2012-12-04: qty 1

## 2012-12-04 MED ORDER — LACTATED RINGERS IV SOLN: INTRAVENOUS | Status: DC

## 2012-12-04 MED ORDER — MEPERIDINE HCL 25 MG/ML IJ SOLN: 6.2500 mg | INTRAMUSCULAR | Status: DC | PRN

## 2012-12-04 MED ORDER — SODIUM CHLORIDE 0.9 % IR SOLN
Status: DC | PRN
  Administered 2012-12-04: 3000 mL

## 2012-12-04 MED ORDER — HYDROMORPHONE HCL PF 1 MG/ML IJ SOLN
INTRAMUSCULAR | Status: AC
  Filled 2012-12-04: qty 1

## 2012-12-04 MED ORDER — LACTATED RINGERS IV SOLN
INTRAVENOUS | Status: DC | PRN
  Administered 2012-12-04 (×2): via INTRAVENOUS

## 2012-12-04 MED ORDER — MUPIROCIN 2 % EX OINT
1.0000 "application " | TOPICAL_OINTMENT | Freq: Two times a day (BID) | CUTANEOUS | Status: AC
  Administered 2012-12-04 – 2012-12-08 (×10): 1 via NASAL
  Filled 2012-12-04 (×2): qty 22

## 2012-12-04 MED ORDER — OXYCODONE-ACETAMINOPHEN 5-325 MG PO TABS
1.0000 | ORAL_TABLET | ORAL | Status: DC | PRN
  Administered 2012-12-04 – 2012-12-11 (×13): 1 via ORAL
  Filled 2012-12-04 (×13): qty 1

## 2012-12-04 MED ORDER — BACITRACIN ZINC 500 UNIT/GM EX OINT
TOPICAL_OINTMENT | CUTANEOUS | Status: AC
  Filled 2012-12-04: qty 15

## 2012-12-04 SURGICAL SUPPLY — 46 items
BANDAGE ELASTIC 3 VELCRO ST LF (GAUZE/BANDAGES/DRESSINGS) IMPLANT
BANDAGE ELASTIC 4 VELCRO ST LF (GAUZE/BANDAGES/DRESSINGS) ×2 IMPLANT
BANDAGE GAUZE ELAST BULKY 4 IN (GAUZE/BANDAGES/DRESSINGS) ×4 IMPLANT
BNDG COHESIVE 4X5 TAN STRL (GAUZE/BANDAGES/DRESSINGS) ×6 IMPLANT
BNDG COHESIVE 4X5 WHT NS (GAUZE/BANDAGES/DRESSINGS) ×2 IMPLANT
BNDG ESMARK 4X9 LF (GAUZE/BANDAGES/DRESSINGS) IMPLANT
CHLORAPREP W/TINT 26ML (MISCELLANEOUS) ×2 IMPLANT
CLOTH BEACON ORANGE TIMEOUT ST (SAFETY) ×2 IMPLANT
COVER SURGICAL LIGHT HANDLE (MISCELLANEOUS) ×2 IMPLANT
CUFF TOURNIQUET SINGLE 18IN (TOURNIQUET CUFF) ×2 IMPLANT
CUFF TOURNIQUET SINGLE 24IN (TOURNIQUET CUFF) IMPLANT
DRAPE SURG 17X23 STRL (DRAPES) ×2 IMPLANT
DRSG ADAPTIC 3X8 NADH LF (GAUZE/BANDAGES/DRESSINGS) IMPLANT
ELECT REM PT RETURN 9FT ADLT (ELECTROSURGICAL)
ELECTRODE REM PT RTRN 9FT ADLT (ELECTROSURGICAL) IMPLANT
EVACUATOR 1/8 PVC DRAIN (DRAIN) IMPLANT
GAUZE XEROFORM 5X9 LF (GAUZE/BANDAGES/DRESSINGS) ×2 IMPLANT
GLOVE BIO SURGEON STRL SZ7.5 (GLOVE) ×2 IMPLANT
GLOVE BIOGEL PI IND STRL 8 (GLOVE) ×1 IMPLANT
GLOVE BIOGEL PI INDICATOR 8 (GLOVE) ×1
GOWN PREVENTION PLUS XXLARGE (GOWN DISPOSABLE) ×2 IMPLANT
GOWN STRL NON-REIN LRG LVL3 (GOWN DISPOSABLE) ×6 IMPLANT
HANDPIECE INTERPULSE COAX TIP (DISPOSABLE) ×1
KIT BASIN OR (CUSTOM PROCEDURE TRAY) ×2 IMPLANT
KIT ROOM TURNOVER OR (KITS) ×2 IMPLANT
MANIFOLD NEPTUNE II (INSTRUMENTS) ×2 IMPLANT
NS IRRIG 1000ML POUR BTL (IV SOLUTION) ×2 IMPLANT
PACK ORTHO EXTREMITY (CUSTOM PROCEDURE TRAY) ×2 IMPLANT
PAD ARMBOARD 7.5X6 YLW CONV (MISCELLANEOUS) ×4 IMPLANT
PAD CAST 4YDX4 CTTN HI CHSV (CAST SUPPLIES) ×1 IMPLANT
PADDING CAST COTTON 4X4 STRL (CAST SUPPLIES) ×1
SET HNDPC FAN SPRY TIP SCT (DISPOSABLE) ×1 IMPLANT
SPONGE GAUZE 4X4 12PLY (GAUZE/BANDAGES/DRESSINGS) ×2 IMPLANT
SPONGE LAP 18X18 X RAY DECT (DISPOSABLE) IMPLANT
STOCKINETTE IMPERVIOUS 9X36 MD (GAUZE/BANDAGES/DRESSINGS) IMPLANT
SUT ETHILON 3 0 PS 1 (SUTURE) ×6 IMPLANT
SUT ETHILON 4 0 PS 2 18 (SUTURE) IMPLANT
SUT VICRYL RAPIDE 4/0 PS 2 (SUTURE) IMPLANT
SWAB COLLECTION DEVICE MRSA (MISCELLANEOUS) ×2 IMPLANT
TOWEL OR 17X24 6PK STRL BLUE (TOWEL DISPOSABLE) ×2 IMPLANT
TOWEL OR 17X26 10 PK STRL BLUE (TOWEL DISPOSABLE) ×2 IMPLANT
TUBE ANAEROBIC SPECIMEN COL (MISCELLANEOUS) ×2 IMPLANT
TUBE CONNECTING 12X1/4 (SUCTIONS) ×2 IMPLANT
UNDERPAD 30X30 INCONTINENT (UNDERPADS AND DIAPERS) ×2 IMPLANT
WATER STERILE IRR 1000ML POUR (IV SOLUTION) ×2 IMPLANT
YANKAUER SUCT BULB TIP NO VENT (SUCTIONS) ×2 IMPLANT

## 2012-12-04 NOTE — Progress Notes (Signed)
patients fingers all warm able to move and feels sensation,with good capillary refill 2+ brachial pulse

## 2012-12-04 NOTE — Progress Notes (Signed)
Patient admitted to 5511 from ED. Patient is A&Ox3. Patient oriented to unit and room. Patient lives at home alone. Patient's left hand is red and extremely swollen, with rings to fingers are crusted around, and palm is red and yellow and slightly open.  Patient has small red round area to right upper leg.  Placed on tele running Stach. Notified Sonnenberg, MD that patient's HR is in the 130's.  MD gave order to call only if HR is greater than 140.  Oriented patient to room and unit.  Will continue to monitor patient. Nelda Marseille, RN

## 2012-12-04 NOTE — OR Nursing (Signed)
Three rings cut off placed in labeled specimen bag and sent with patient to pacu

## 2012-12-04 NOTE — Anesthesia Postprocedure Evaluation (Signed)
  Anesthesia Post-op Note  Patient: Stephen Fitzgerald  Procedure(s) Performed: Procedure(s): IRRIGATION AND DEBRIDEMENT Left Hand with Ring Removal  times three, Carpal Tunnel , Flexor tendon Synovectomy (Left)  Patient Location: PACU  Anesthesia Type:General  Level of Consciousness: awake, alert , oriented and patient cooperative  Airway and Oxygen Therapy: Patient Spontanous Breathing and Patient connected to nasal cannula oxygen  Post-op Pain: mild  Post-op Assessment: Post-op Vital signs reviewed, Patient's Cardiovascular Status Stable, Respiratory Function Stable, Patent Airway, No signs of Nausea or vomiting and Pain level controlled  Post-op Vital Signs: Reviewed and stable  Complications: No apparent anesthesia complications

## 2012-12-04 NOTE — Progress Notes (Signed)
Family Medicine Teaching Service Daily Progress Note Service Page: [phone]  Patient Assessment: Stephen Fitzgerald is a 52 y.o. year old male with a history of untreated diabetes presenting with frank infection of his left hand and fingers from imbedded rings.  Subjective: Pt reports that overnight his left arm began hurting up the wrist and into his forearm for about 3 hours. His hand continues to feel itchy.  Objective: Temp:  [98.9 F (37.2 C)-102 F (38.9 C)] 98.9 F (37.2 C) (05/21 0524) Pulse Rate:  [121-143] 121 (05/21 0524) Resp:  [18-30] 20 (05/21 0524) BP: (128-176)/(85-131) 165/93 mmHg (05/21 0524) SpO2:  [96 %-99 %] 98 % (05/21 0524) Weight:  [176 lb 9.4 oz (80.1 kg)] 176 lb 9.4 oz (80.1 kg) (05/21 0107) Exam: General: NAD Cardiovascular: RRR Respiratory: NWOB, some coarse breath sounds present Abdomen: soft, nontender to palpation Extremities: SCD's in place, calves nontender to palpation. L hand with continued erythema and purulent drainage, 3 rings in place  I have reviewed the patient's medications, labs, imaging, and diagnostic testing.  Notable results are summarized below.  Medications:  Scheduled Meds: . aspirin  325 mg Oral Daily  . Chlorhexidine Gluconate Cloth  6 each Topical Daily  . folic acid  1 mg Oral Daily  . insulin aspart  0-15 Units Subcutaneous TID WC  . multivitamin with minerals  1 tablet Oral Daily  . mupirocin ointment  1 application Nasal BID  . piperacillin-tazobactam (ZOSYN)  IV  3.375 g Intravenous Q8H  . simvastatin  40 mg Oral QPM  . thiamine  100 mg Oral Daily   Or  . thiamine  100 mg Intravenous Daily  . vancomycin  1,000 mg Intravenous Q12H   Continuous Infusions: . sodium chloride 1,000 mL (12/04/12 0543)   PRN Meds:.acetaminophen, acetaminophen, hydrALAZINE, LORazepam, LORazepam  Labs:  CBC  Recent Labs Lab 12/03/12 1503 12/03/12 2251 12/04/12 0545  WBC 7.9 10.8* 11.3*  HGB 13.8 12.0* 12.7*  HCT 39.5 34.3*  37.7*  PLT 222 234 227    BMET  Recent Labs Lab 12/03/12 1503 12/03/12 2251 12/04/12 0545  NA 134* 133* 135  K 4.3 3.9 3.7  CL 97 100 101  CO2 21 20 23   BUN 16 12 8   CREATININE 1.02 0.72 0.53  GLUCOSE 523* 410* 262*  CALCIUM 8.9 8.0* 8.6    Lipid Panel     Component Value Date/Time   CHOL 131 12/04/2012 0545   TRIG 76 12/04/2012 0545   HDL 36* 12/04/2012 0545   CHOLHDL 3.6 12/04/2012 0545   VLDL 15 12/04/2012 0545   LDLCALC 80 12/04/2012 0545    CBG (last 3)   Recent Labs  12/03/12 1740 12/03/12 2135  GLUCAP 296* 270*  SSI in last 24: 15 units  Imaging/Diagnostic Tests: Sed rate 70  Lactic acid 1.64   L Hand Xray:  No acute fracture or subluxation. Soft tissue swelling mid and distal aspect second third and fourth finger. Limited study by metallic rings.  Assessment/Plan:  Stephen Fitzgerald is a 57 y.o. year old male with a history of untreated diabetes presenting with frank infection of his left hand and fingers from imbedded rings. Febrile to 100.7 and tachycardia present so patient meets sepsis criteria. Overall clinically well-appearing except for his localized infection.   ID:  # Hand infection/sepsis:  Continues to be tachycardic and febrile overnight. Now with leukocytosis. - hand surgery consulted in ED and is planning to take pt to OR today at 11am  -  no blood cx obtained as pt already had a dose of ancef in ER, but did obtain wound culture - broad spectrum abx coverage with Vanc/Zosyn - dosed per pharmacy  - anticipate need for long term IV antibiotics. Will await result of surgery and if necessary, consult infectious disease for long term mgmt plan (IV vs po abx at d/c)  -may need to consider further imaging of hand once rings are removed; will discuss with surgeon post-op for rec's  - tylenol PRN fever  - IV fluids   ENDO:  # Diabetes: glucose >500 on BMET on admission. Chronic and untreated.  - A1c in process - begin with moderate SSI (pt's  father reports he was previously on 2 po agents, never required SQ insulin)  - carb modified diet  # Hyperlipidemia - pt is on statin at home (simva 40 daily) but this is given to him by his father, not prescribed for him by a doctor  - continue this dose for now  - lipids appear well controlled (LDL 80)  CARDIOVASCULAR:  # Elevated BP's:  - elevated in 160s/90s this AM - treat with prn IV hydralazine for now  - anticipate need to start oral regimen after surgery   NEURO:  # Hx of stroke:  - continue aspirin daily   PSYCH:  # Alcohol Use: reports drinking several beers per day  - will observe with CIWA protocol and prn ativan  - CIWA scores thus far 0 - UDS negative # Behavior:  - Pt exhibits some strange behaviors and does not seem to fully appreciate the severity of his infection. Has gone for months likely with frank infection of his hand, without seeking care. Does not seem to pick up on social cues and is very difficult to redirect, getting fixated on certain topics throughout conversation. This behavior along with the hx provided by his father of a neat and orderly apartment suggests possible autism spectrum disorder. Will treat patient's immediate medical needs now (hand infection and diabetes) and consult psychiatry if need arises in the future.   FEN/GI:  - NS @ 125 cc/hr  - currently NPO for surgery, resume carb modified diet after surgery  SOCIAL:  # Dispo:  - pending clinical improvement  - will likely require prolonged IV antibiotic treatment through a PICC line, so SNF placement may be necessary in light of pt's social situation  # Code Status:  -did not discuss, presume full code  Levert Feinstein, MD [facility] Medicine PGY-1 Service Pager [phone]

## 2012-12-04 NOTE — Progress Notes (Signed)
Inpatient Diabetes Program Recommendations  AACE/ADA: New Consensus Statement on Inpatient Glycemic Control (2013)  Target Ranges:  Prepandial:   less than 140 mg/dL      Peak postprandial:   less than 180 mg/dL (1-2 hours)      Critically ill patients:  140 - 180 mg/dL   Results for COBE, VINEY (MRN [id number]) as of 12/04/2012 12:48  Ref. Range 12/03/2012 17:40 12/03/2012 21:35 12/04/2012 07:51  Glucose-Capillary Latest Range: 70-99 mg/dL 409 (H) 811 (H) 914 (H)  Results for DMANI, MIZER (MRN [id number]) as of 12/04/2012 12:48  Ref. Range 12/03/2012 15:03 12/03/2012 22:51 12/04/2012 05:45  Hemoglobin A1C Latest Range: <5.7 %   9.0 (H)  Glucose Latest Range: 70-99 mg/dL 086 (H) 578 (H) 469 (H)   Inpatient Diabetes Program Recommendations Insulin - Basal: Please consider starting low dose basal insulin; recommend Levemir 10 units QHS. Insulin - Correction:  Please consider adding Novolog bedtime correction.    Note: According to the chart, patient has a history of diabetes and used to take 2 PO medications to manage diabetes.  However, his PCP stopped seeing patient and patient felt diabetes was cured since his PCP discharged him from his practice (according to the H&P on 12/03/12).  Therefore, diabetes has not been treated since that time.  Patient is scheduled for hand surgery today.   Initial lab blood glucose was 523 mg/dl on [birth year] and has ranged from 238 - 410 mg/dl since being admitted.  A1C noted to be 9.0% on 12/04/12 and fasting CBG this morning was 238 mg/dl.  Patient is currently ordered Novolog moderate correction AC.  Please consider ordering low dose basal insulin; recommend Levemir 10 units QHS.  Also, please consider adding Novolog bedtime correction.  Thanks, Orlando Penner, RN, MSN, CCRN Diabetes Coordinator Inpatient Diabetes Program [phone]

## 2012-12-04 NOTE — Progress Notes (Signed)
UR COMPLETED  

## 2012-12-04 NOTE — Progress Notes (Signed)
A raised pustule was noted over the pt's right scapula. Redness and induration surrounding the pustule measure 3 cm.

## 2012-12-04 NOTE — Progress Notes (Signed)
Pt able to move fingers color improved pinker 4 th finger starting to become warmer and slightly cyanotic but with good capillary refill. Feels sensation

## 2012-12-04 NOTE — Transfer of Care (Signed)
Immediate Anesthesia Transfer of Care Note  Patient: Stephen Fitzgerald  Procedure(s) Performed: Procedure(s): IRRIGATION AND DEBRIDEMENT Left Hand with Ring Removal  times three, Carpal Tunnel , Flexor tendon Synovectomy (Left)  Patient Location: PACU  Anesthesia Type:General  Level of Consciousness: awake, alert , oriented and patient cooperative  Airway & Oxygen Therapy: Patient Spontanous Breathing and Patient connected to nasal cannula oxygen  Post-op Assessment: Report given to PACU RN and Post -op Vital signs reviewed and stable  Post vital signs: Reviewed and stable  Complications: No apparent anesthesia complications

## 2012-12-04 NOTE — Progress Notes (Signed)
Pt arrived with compression dressing on left hand fingers warm and dry less than 3 secs capillary refill eelvated on pillow 4 th finger cyanotic and cold Md surgeon aware.

## 2012-12-04 NOTE — OR Nursing (Signed)
preop assessment done by Peterson Lombard RN

## 2012-12-04 NOTE — Preoperative (Signed)
Beta Blockers   Reason not to administer Beta Blockers:Not Applicable 

## 2012-12-04 NOTE — Progress Notes (Signed)
FMTS Attending Daily Note:  Renold Don MD  [phone] pager  Family Practice pager:  [phone] I have discussed this patient with the resident Dr. Pollie Meyer and attending physician Dr. Sheffield Slider.  I agree with their findings, assessment, and care plan

## 2012-12-04 NOTE — Anesthesia Preprocedure Evaluation (Addendum)
Anesthesia Evaluation  Patient identified by MRN, date of birth, ID band Patient awake    Reviewed: Allergy & Precautions  History of Anesthesia Complications Negative for: history of anesthetic complications  Airway Mallampati: II TM Distance: >3 FB Neck ROM: Full    Dental  (+) Teeth Intact and Dental Advisory Given   Pulmonary neg pulmonary ROS,  breath sounds clear to auscultation  Pulmonary exam normal       Cardiovascular negative cardio ROS  Rhythm:Regular Rate:Normal     Neuro/Psych CVA, No Residual Symptoms    GI/Hepatic negative GI ROS, Neg liver ROS,   Endo/Other  diabetes (glu 238, patient not previously aware that he is diabetic), Type 2  Renal/GU negative Renal ROS     Musculoskeletal   Abdominal   Peds  Hematology   Anesthesia Other Findings   Reproductive/Obstetrics negative OB ROS                         Anesthesia Physical Anesthesia Plan  ASA: II  Anesthesia Plan: General   Post-op Pain Management:    Induction: Intravenous  Airway Management Planned: LMA  Additional Equipment:   Intra-op Plan:   Post-operative Plan:   Informed Consent: I have reviewed the patients History and Physical, chart, labs and discussed the procedure including the risks, benefits and alternatives for the proposed anesthesia with the patient or authorized representative who has indicated his/her understanding and acceptance.   Dental advisory given  Plan Discussed with: CRNA, Anesthesiologist and Surgeon  Anesthesia Plan Comments: (Plan routine monitors, GA-LMA OK)       Anesthesia Quick Evaluation

## 2012-12-04 NOTE — Anesthesia Procedure Notes (Signed)
Procedure Name: LMA Insertion Date/Time: 12/04/2012 11:37 AM Performed by: Leona Singleton A Pre-anesthesia Checklist: Patient identified Patient Re-evaluated:Patient Re-evaluated prior to inductionOxygen Delivery Method: Circle system utilized Preoxygenation: Pre-oxygenation with 100% oxygen Intubation Type: IV induction LMA: LMA inserted LMA Size: 4.0 Tube type: Oral Placement Confirmation: positive ETCO2 and breath sounds checked- equal and bilateral Tube secured with: Tape Dental Injury: Teeth and Oropharynx as per pre-operative assessment

## 2012-12-04 NOTE — H&P (Signed)
I interviewed and examined this patient and discussed the care plan with Dr. Fara Boros and the [facility] team and agree with assessment and plan as documented in the admission note. He is a poor historian due to circuitous responses that seem to combine recent experiences with past life events. He could indicate that his left facial weakness preceded his stroke which he says affected his right side. He can wrinkle his left forehead which weighs against Bell's palsy as the cause. I agree with his diabetes treatment for control in the perioperative period. He doesn't seem to be aware of the serious nature of his hand infection.   Tre Sanker A. Sheffield Slider, MD Family Medicine Teaching Service Attending  12/04/2012 8:21 AM

## 2012-12-05 ENCOUNTER — Encounter (HOSPITAL_COMMUNITY): Payer: Self-pay | Admitting: Orthopedic Surgery

## 2012-12-05 DIAGNOSIS — L03119 Cellulitis of unspecified part of limb: Secondary | ICD-10-CM | POA: Diagnosis not present

## 2012-12-05 DIAGNOSIS — L02519 Cutaneous abscess of unspecified hand: Secondary | ICD-10-CM

## 2012-12-05 DIAGNOSIS — E119 Type 2 diabetes mellitus without complications: Secondary | ICD-10-CM | POA: Diagnosis not present

## 2012-12-05 LAB — CBC WITH DIFFERENTIAL/PLATELET
Basophils Absolute: 0 10*3/uL (ref 0.0–0.1)
Eosinophils Absolute: 0.1 10*3/uL (ref 0.0–0.7)
Eosinophils Relative: 1 % (ref 0–5)
MCH: 28.6 pg (ref 26.0–34.0)
MCHC: 33.6 g/dL (ref 30.0–36.0)
MCV: 85.2 fL (ref 78.0–100.0)
Platelets: 226 10*3/uL (ref 150–400)
RDW: 12.8 % (ref 11.5–15.5)

## 2012-12-05 LAB — BASIC METABOLIC PANEL
Calcium: 8.2 mg/dL — ABNORMAL LOW (ref 8.4–10.5)
Creatinine, Ser: 0.65 mg/dL (ref 0.50–1.35)
GFR calc non Af Amer: >90 mL/min (ref >90)
Sodium: 132 mEq/L — ABNORMAL LOW (ref 135–145)

## 2012-12-05 LAB — GLUCOSE, CAPILLARY
Glucose-Capillary: 201 mg/dL — ABNORMAL HIGH (ref 70–99)
Glucose-Capillary: 264 mg/dL — ABNORMAL HIGH (ref 70–99)

## 2012-12-05 LAB — WOUND CULTURE: Gram Stain: NONE SEEN

## 2012-12-05 MED ORDER — LIVING WELL WITH DIABETES BOOK
Freq: Once | Status: DC
  Filled 2012-12-05: qty 1

## 2012-12-05 NOTE — Progress Notes (Signed)
I will plan to evaluate this patient today in the afternoon, and speak with him about surgery tentatively planned for Friday to include index finger amputation, possibly ring finger depending upon his clinical status, and second look I and D of the open wounds.  Mack Hook

## 2012-12-05 NOTE — Progress Notes (Signed)
I have seen and examined this patient. I have discussed with Dr Pollie Meyer.  I agree with their findings and plans as documented in their progress note.  Will consult Infectious Disease for their opinions on antibiotic selection, route of administration and duration of therapy.

## 2012-12-05 NOTE — Op Note (Signed)
12/03/2012 - 12/04/2012  9:02 AM  PATIENT:  Stephen Fitzgerald  52 y.o. 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 Janee Morn, MD  PHYSICIAN ASSISTANT: None  ANESTHESIA:  general  SPECIMENS:  Jewelry removed and  returned to patient.  DRAINS:   None  PREOPERATIVE INDICATIONS:  Stephen Fitzgerald is a  52 y.o. male Who presented to the emergency department yesterday with a bizarre history.  He reports that his left hand began to swell a little bit, and the rings that he wears across all 4 fingers became a little tight.  He then developed blisters on the fingers and a began to leak, and all this occurred in March.  He presented yesterday for the first time to the emergency department with rings that had become incompletely internalized, cutting into the soft tissues on the volar aspect of the fingers, dripping pus from open wounds along the index finger ray as well as having crusty caked drainage all around the rings.The small finger ring was removed in the emergency department by the emergency department, and the remainder could not be removed in that fashion  The risks benefits and alternatives were discussed with the patient preoperatively including but not limited to the risks of infection, bleeding, nerve injury, cardiopulmonary complications, the need for revision surgery, among others, and the patient verbalized understanding and consented to  proceed.  OPERATIVE IMPLANTS: None  OPERATIVE FINDINGS: After removal of the rings, the index and ring finger was devoid of tissue on the palmar surface all the way down to the level of the bone, with the only crossing longitudinal structures intact volarly being the flexor tendons, and the ulnar digital artery and nerve on both fingers.  There was pus in both the radial and ulnar bursa and chronic flexor tenosynovitis extending to the proximal end of the carpal tunnel  OPERATIVE PROCEDURE:  After receiving prophylactic antibiotics, the patient was escorted to the operative theatre and placed in a supine position.  General anesthesia was administered.A surgical "time-out" was performed during which the planned procedure, proposed operative site, and the correct patient identity were compared to the operative consent and agreement confirmed by the circulating nurse according to current facility policy. All the rings were removed with a combination of a ring cutter, both cutter, and pin cutter.  The hand was then scrubbed with Hibiclens scrub brush and formally prepped with Betadine before being draped.  Tourniquet had been applied to the arm, and it was inflated to 250 mmHg.  The areas where the rings and cut into the index and ring fingers were debrided using just a sponge and my finger.  There was granulation tissue that wiped away.  The flexor tendons were exposed and in fact in the ring finger the flexor tendon was a little blackening on its superficial surface and this was debrided with scissors.  The same thing was to for the index finger.  The skin edges were trimmed back to healthy skin edges.  This left a gap of soft tissue coverage volarly about a centimeter on both digits.  It was a hole that had formed spontaneously in the index finger distal to this.  On the ring finger the radial digital neurovascular structures had been divided by the rings.  The ulnar side remained.  An incision was then made  along the index finger ray to the mid palm coursing across the carpal tunnel and then diagonally across the wrist crease and months to fully into the proximal forearm.  This was opened up, and pus was in the radial bursa and this was opened and drained.  On the ulnar side the same thing.  Radical flexor tenosynovectomy of all the flexor tendons coursing to the carpal tunnel was performed all the way from the muscle-tendon junction in the forearm down to the level of the lumbricals.  Everything was then copiously irrigated and the incision closed loosely with 3-0 nylon sutures. The open wounds of the index and ring finger were dressed with bacitracin.  The ring finger was dusky.  The tourniquet was released prior to wound closure, some additional hemostasis obtained and the wound was copiously irrigated with light pulsatile lavage.  A bulky dressing was applied without constricting circumferential wrapping views taken the recovery room.  DISPOSITION: He'll be transferred back to the floor to resume medical management of this problem.

## 2012-12-05 NOTE — Progress Notes (Signed)
Subjective: POD 1 removal of rings and debridement of left hand Sitting up, fairly comfortable, talkative   Objective: Vital signs in last 24 hours: Temp:  [98 F (36.7 C)-100.4 F (38 C)] 100.4 F (38 C) (05/22 1500) Pulse Rate:  [73-139] 115 (05/22 1500) Resp:  [18-20] 20 (05/22 1500) BP: (119-129)/(75-77) 125/76 mmHg (05/22 1500) SpO2:  [94 %-98 %] 97 % (05/22 1500)  Intake/Output from previous day: 05/21 0701 - 05/22 0700 In: 3193.8 [I.V.:2893.8; IV Piggyback:300] Out: 2250 [Urine:2200; Blood:50] Intake/Output this shift: Total I/O In: 380 [P.O.:380] Out: 675 [Urine:675]   Recent Labs  12/03/12 1503 12/03/12 2251 12/04/12 0545 12/05/12 1020  HGB 13.8 12.0* 12.7* 11.2*    Recent Labs  12/04/12 0545 12/05/12 1020  WBC 11.3* 13.4*  RBC 4.42 3.91*  HCT 37.7* 33.3*  PLT 227 226    Recent Labs  12/04/12 0545 12/05/12 1020  NA 135 132*  K 3.7 3.8  CL 101 97  CO2 23 22  BUN 8 7  CREATININE 0.53 0.65  GLUCOSE 262* 304*  CALCIUM 8.6 8.2*   No results found for this basename: LABPT, INR,  in the last 72 hours  Left hand in dressing, fingers all warm,pink  Assessment/Plan: Spoke with him about operative findings, showed pictures to him. Discussed options with him, and recommended sacrifice of IF in attempt to salvage RF Will proceed to OR tomorrow for 2nd look I&D, creation of fillet flap of L IF for planned later transfer as pedicled flap to RF next week if infection improves sufficiently.  He voiced understanding that this is, in essence, a staged amputation of the L IF, attempting to use parts from it to salvage the RF. NPO after MN--Obtain consent  Both ordered.   Sharise Lippy A. 12/05/2012, 3:45 PM

## 2012-12-05 NOTE — Progress Notes (Signed)
Family Medicine Teaching Service Daily Progress Note Service Page: [phone]  Patient Assessment: Stephen Fitzgerald is a 52 y.o. year old male with a history of untreated diabetes presenting with frank infection of his left hand and fingers from imbedded rings.  Subjective: Pt reports pain is well controlled. Tolerated eating breakfast. Hand just feels itchy on occasion.  Objective: Temp:  [98 F (36.7 C)-99.5 F (37.5 C)] 99 F (37.2 C) (05/22 0500) Pulse Rate:  [73-139] 73 (05/22 0500) Resp:  [10-20] 20 (05/22 0500) BP: (119-178)/(76-108) 119/77 mmHg (05/22 0500) SpO2:  [94 %-100 %] 98 % (05/22 0500) Exam: General: NAD Cardiovascular: RRR Respiratory: NWOB, CTAB via anterior auscultation (pt unable to sit up) Abdomen: soft, nontender to palpation Extremities: SCD's in place, calves nontender to palpation. L hand and forearm heavily wrapped. Sensation to light touch intact on fingertips of all fingers of left hand. Brisk capillary refill on all fingers. Forearm swollen up to the elbow but no erythema. Neuro: grossly nonfocal, speech intact  I have reviewed the patient's medications, labs, imaging, and diagnostic testing.  Notable results are summarized below.  Medications:  Scheduled Meds: . aspirin  325 mg Oral Daily  . Chlorhexidine Gluconate Cloth  6 each Topical Daily  . folic acid  1 mg Oral Daily  . insulin aspart  0-15 Units Subcutaneous TID WC  . metoprolol tartrate  25 mg Oral BID  . multivitamin with minerals  1 tablet Oral Daily  . mupirocin ointment  1 application Nasal BID  . piperacillin-tazobactam (ZOSYN)  IV  3.375 g Intravenous Q8H  . simvastatin  40 mg Oral QPM  . thiamine  100 mg Oral Daily   Or  . thiamine  100 mg Intravenous Daily  . vancomycin  1,000 mg Intravenous Q12H   Continuous Infusions: . sodium chloride 1,000 mL (12/05/12 0544)   PRN Meds:.acetaminophen, acetaminophen, hydrALAZINE, HYDROmorphone (DILAUDID) injection, LORazepam, LORazepam,  meperidine (DEMEROL) injection, oxyCODONE-acetaminophen, promethazine  Labs:  CBC  Recent Labs Lab 12/03/12 1503 12/03/12 2251 12/04/12 0545  WBC 7.9 10.8* 11.3*  HGB 13.8 12.0* 12.7*  HCT 39.5 34.3* 37.7*  PLT 222 234 227    BMET  Recent Labs Lab 12/03/12 1503 12/03/12 2251 12/04/12 0545  NA 134* 133* 135  K 4.3 3.9 3.7  CL 97 100 101  CO2 21 20 23   BUN 16 12 8   CREATININE 1.02 0.72 0.53  GLUCOSE 523* 410* 262*  CALCIUM 8.9 8.0* 8.6    Lipid Panel     Component Value Date/Time   CHOL 131 12/04/2012 0545   TRIG 76 12/04/2012 0545   HDL 36* 12/04/2012 0545   CHOLHDL 3.6 12/04/2012 0545   VLDL 15 12/04/2012 0545   LDLCALC 80 12/04/2012 0545    CBG (last 3)   Recent Labs  12/04/12 1807 12/04/12 2051 12/05/12 0751  GLUCAP 265* 259* 201*  SSI in last 24: 12 units   Sed rate 70  Lactic acid 1.64  Hgb A1c 9.0  Wound cx 5/20 ED: multiple organisms present, none predominant (no staph aureus, no group A strep) Wound cx 5/21 OR: no growth x 1 day  Imaging/Diagnostic Tests:  L Hand Xray:  No acute fracture or subluxation. Soft tissue swelling mid and distal aspect second third and fourth finger. Limited study by metallic rings.  Assessment/Plan:  Stephen Fitzgerald is a 78 y.o. year old male with a history of untreated diabetes presenting with frank infection of his left hand and fingers from imbedded rings. Febrile  to 100.7 and tachycardia present so patient met sepsis criteria on his rings. Overall clinically well-appearing except for his localized infection.   ID:  # Hand infection/sepsis:  Continues to be tachycardic but now afebrile. Labs pending for this morning. - s/p surgery on 5/21 - no blood cx obtained as pt already had a dose of ancef in ER, but did obtain wound culture - broad spectrum abx coverage with Vanc/Zosyn - dosed per pharmacy  - anticipate need for long term IV antibiotics. Will follow clinically for improvement and if necessary,  consult infectious disease for long term mgmt plan (IV vs po abx at d/c)  -may need to consider further imaging of hand - will seek surgery input regarding this - tylenol PRN fever, dilaudid/oxy prn pain - IV fluids   ENDO:  # Diabetes: glucose >500 on BMET on admission. Chronic and untreated.  - A1c 9.0 - begin with moderate SSI (pt's father reports he was previously on 2 po agents, never required SQ insulin)  - now that pt is s/p surgery, will wait to start long acting insulin until tomorrow morning after seeing how much insulin he requires in next 24 hours - carb modified diet  # Hyperlipidemia - pt is on statin at home (simva 40 daily) but this is given to him by his father, not prescribed for him by a doctor  - continue this dose for now  - lipids appear well controlled (LDL 80)  CARDIOVASCULAR:  # Elevated BP's:  - improved this morning after initiating metoprolol 25mg  BID yesterday, continue this regimen  NEURO:  # Hx of stroke:  - continue aspirin daily   PSYCH:  # Alcohol Use: reports drinking several beers per day  - will observe with CIWA protocol and prn ativan  - CIWA scores thus far 0 - UDS negative # Behavior:  - Pt exhibits some strange behaviors and does not seem to fully appreciate the severity of his infection. Has gone for months likely with frank infection of his hand, without seeking care. Does not seem to pick up on social cues and is very difficult to redirect, getting fixated on certain topics throughout conversation. This behavior along with the hx provided by his father of a neat and orderly apartment suggests possible autism spectrum disorder. Will treat patient's immediate medical needs now (hand infection and diabetes) and consult psychiatry if need arises in the future.   FEN/GI:  - NS @ 125 cc/hr  - carb modified  SOCIAL:  # Dispo:  - pending clinical improvement  - will likely require prolonged IV antibiotic treatment through a PICC line, so SNF  placement may be necessary in light of pt's social situation  # Code Status:  -did not discuss, presume full code  Levert Feinstein, MD [facility] Medicine PGY-1 Service Pager [phone]

## 2012-12-05 NOTE — Plan of Care (Signed)
Problem: Undesirable Food Choices (NB-1.7) Goal: Nutrition education Formal process to instruct or train a patient/client in a skill or to impart knowledge to help patients/clients voluntarily manage or modify food choices and eating behavior to maintain or improve health. Outcome: Completed/Met Date Met:  12/05/12 RD consulted for diet education for pt with hx of DM and elevated blood sugars. Per chart review pt has not been taking any medication for his DM recently.  RD attempted to talk with pt about his diet. Pt eats out for all meals. Pt was unable to follow conversation about his food and intake. Pt states food is not a problem for him, but the medication will "put me in the ground".  Pt is not appropriate for diet education.  RD provided hand out on plate method for controlling carbohydrates.     Lab Results  Component Value Date    HGBA1C 9.0* 12/04/2012   Body mass index is 23.94 kg/(m^2). WNL Diet: Carb Mod Medium   Chart reviewed, no additional nutrition interventions warranted at this time. Pt is not appropriate for further in-patient education. Please re-consult as needed.   Clarene Duke RD, LDN Pager [phone] After Hours pager [phone]

## 2012-12-06 ENCOUNTER — Encounter (HOSPITAL_COMMUNITY)
Admission: EM | Disposition: A | Discharge status: Skilled Nursing Facility | Payer: Self-pay | Source: Home / Self Care | Attending: Family Medicine

## 2012-12-06 ENCOUNTER — Encounter (HOSPITAL_COMMUNITY): Payer: Self-pay | Admitting: Anesthesiology

## 2012-12-06 ENCOUNTER — Encounter (HOSPITAL_COMMUNITY): Payer: Self-pay | Admitting: Certified Registered"

## 2012-12-06 ENCOUNTER — Inpatient Hospital Stay (HOSPITAL_COMMUNITY): Payer: Medicare Other | Admitting: Certified Registered"

## 2012-12-06 DIAGNOSIS — M6790 Unspecified disorder of synovium and tendon, unspecified site: Secondary | ICD-10-CM | POA: Diagnosis not present

## 2012-12-06 DIAGNOSIS — B999 Unspecified infectious disease: Secondary | ICD-10-CM

## 2012-12-06 DIAGNOSIS — S61409A Unspecified open wound of unspecified hand, initial encounter: Secondary | ICD-10-CM | POA: Diagnosis not present

## 2012-12-06 DIAGNOSIS — G8918 Other acute postprocedural pain: Secondary | ICD-10-CM | POA: Diagnosis not present

## 2012-12-06 DIAGNOSIS — M65849 Other synovitis and tenosynovitis, unspecified hand: Secondary | ICD-10-CM | POA: Diagnosis not present

## 2012-12-06 DIAGNOSIS — F840 Autistic disorder: Secondary | ICD-10-CM | POA: Diagnosis not present

## 2012-12-06 DIAGNOSIS — L03818 Cellulitis of other sites: Secondary | ICD-10-CM | POA: Diagnosis not present

## 2012-12-06 DIAGNOSIS — E119 Type 2 diabetes mellitus without complications: Secondary | ICD-10-CM | POA: Diagnosis not present

## 2012-12-06 DIAGNOSIS — B964 Proteus (mirabilis) (morganii) as the cause of diseases classified elsewhere: Secondary | ICD-10-CM

## 2012-12-06 DIAGNOSIS — M009 Pyogenic arthritis, unspecified: Secondary | ICD-10-CM | POA: Diagnosis not present

## 2012-12-06 DIAGNOSIS — L02519 Cutaneous abscess of unspecified hand: Secondary | ICD-10-CM

## 2012-12-06 DIAGNOSIS — M25549 Pain in joints of unspecified hand: Secondary | ICD-10-CM | POA: Diagnosis not present

## 2012-12-06 DIAGNOSIS — A419 Sepsis, unspecified organism: Secondary | ICD-10-CM | POA: Diagnosis not present

## 2012-12-06 DIAGNOSIS — L089 Local infection of the skin and subcutaneous tissue, unspecified: Secondary | ICD-10-CM | POA: Diagnosis not present

## 2012-12-06 DIAGNOSIS — M7150 Other bursitis, not elsewhere classified, unspecified site: Secondary | ICD-10-CM | POA: Diagnosis not present

## 2012-12-06 DIAGNOSIS — M869 Osteomyelitis, unspecified: Secondary | ICD-10-CM | POA: Diagnosis not present

## 2012-12-06 HISTORY — PX: I&D EXTREMITY: SHX5045

## 2012-12-06 HISTORY — PX: AMPUTATION: SHX166

## 2012-12-06 LAB — CBC
Hemoglobin: 10.5 g/dL — ABNORMAL LOW (ref 13.0–17.0)
MCH: 28.9 pg (ref 26.0–34.0)
MCHC: 33.5 g/dL (ref 30.0–36.0)
MCV: 86.2 fL (ref 78.0–100.0)
Platelets: 243 10*3/uL (ref 150–400)

## 2012-12-06 LAB — BASIC METABOLIC PANEL
Calcium: 8.4 mg/dL (ref 8.4–10.5)
Creatinine, Ser: 0.56 mg/dL (ref 0.50–1.35)
GFR calc non Af Amer: >90 mL/min (ref >90)
Glucose, Bld: 223 mg/dL — ABNORMAL HIGH (ref 70–99)
Sodium: 136 mEq/L (ref 135–145)

## 2012-12-06 LAB — VANCOMYCIN, TROUGH: Vancomycin Tr: 7 ug/mL — ABNORMAL LOW (ref 10.0–20.0)

## 2012-12-06 LAB — GLUCOSE, CAPILLARY
Glucose-Capillary: 192 mg/dL — ABNORMAL HIGH (ref 70–99)
Glucose-Capillary: 207 mg/dL — ABNORMAL HIGH (ref 70–99)
Glucose-Capillary: 183 mg/dL — ABNORMAL HIGH (ref 70–99)

## 2012-12-06 SURGERY — AMPUTATION DIGIT
Anesthesia: Regional | Site: Hand | Laterality: Left | Wound class: Dirty or Infected

## 2012-12-06 MED ORDER — BUPIVACAINE-EPINEPHRINE (PF) 0.5% -1:200000 IJ SOLN
INTRAMUSCULAR | Status: AC
  Filled 2012-12-06: qty 10

## 2012-12-06 MED ORDER — FENTANYL CITRATE 0.05 MG/ML IJ SOLN
INTRAMUSCULAR | Status: AC
  Administered 2012-12-06: 50 ug via INTRAVENOUS
  Filled 2012-12-06: qty 2

## 2012-12-06 MED ORDER — ENOXAPARIN SODIUM 40 MG/0.4ML ~~LOC~~ SOLN
40.0000 mg | SUBCUTANEOUS | Status: DC
  Administered 2012-12-07 – 2012-12-11 (×5): 40 mg via SUBCUTANEOUS
  Filled 2012-12-06 (×6): qty 0.4

## 2012-12-06 MED ORDER — FENTANYL CITRATE 0.05 MG/ML IJ SOLN
INTRAMUSCULAR | Status: DC | PRN
  Administered 2012-12-06 (×2): 25 ug via INTRAVENOUS
  Administered 2012-12-06 (×2): 50 ug via INTRAVENOUS

## 2012-12-06 MED ORDER — BUPIVACAINE-EPINEPHRINE PF 0.5-1:200000 % IJ SOLN
INTRAMUSCULAR | Status: DC | PRN
  Administered 2012-12-06: 12 mL

## 2012-12-06 MED ORDER — MIDAZOLAM HCL 2 MG/2ML IJ SOLN
INTRAMUSCULAR | Status: AC
Start: 2012-12-06 — End: 2012-12-06
  Administered 2012-12-06: 2 mg via INTRAVENOUS
  Filled 2012-12-06: qty 2

## 2012-12-06 MED ORDER — GLIPIZIDE 5 MG PO TABS
5.0000 mg | ORAL_TABLET | Freq: Two times a day (BID) | ORAL | Status: DC
  Administered 2012-12-07 – 2012-12-12 (×10): 5 mg via ORAL
  Filled 2012-12-06 (×13): qty 1

## 2012-12-06 MED ORDER — MIDAZOLAM HCL 2 MG/2ML IJ SOLN: 1.0000 mg | INTRAMUSCULAR | Status: DC | PRN

## 2012-12-06 MED ORDER — FENTANYL CITRATE 0.05 MG/ML IJ SOLN: 50.0000 ug | INTRAMUSCULAR | Status: DC | PRN

## 2012-12-06 MED ORDER — 0.9 % SODIUM CHLORIDE (POUR BTL) OPTIME
TOPICAL | Status: DC | PRN
  Administered 2012-12-06 (×2): 1000 mL

## 2012-12-06 MED ORDER — BACITRACIN ZINC 500 UNIT/GM EX OINT
TOPICAL_OINTMENT | CUTANEOUS | Status: AC
  Filled 2012-12-06: qty 15

## 2012-12-06 MED ORDER — LACTATED RINGERS IV SOLN
INTRAVENOUS | Status: DC | PRN
  Administered 2012-12-06 (×2): via INTRAVENOUS

## 2012-12-06 MED ORDER — PROPOFOL 10 MG/ML IV BOLUS
INTRAVENOUS | Status: DC | PRN
  Administered 2012-12-06: 200 mg via INTRAVENOUS

## 2012-12-06 MED ORDER — ESMOLOL HCL 10 MG/ML IV SOLN
INTRAVENOUS | Status: DC | PRN
  Administered 2012-12-06: 30 mg via INTRAVENOUS

## 2012-12-06 MED ORDER — LIDOCAINE HCL (CARDIAC) 20 MG/ML IV SOLN
INTRAVENOUS | Status: DC | PRN
  Administered 2012-12-06: 100 mg via INTRAVENOUS

## 2012-12-06 MED ORDER — PHENYLEPHRINE HCL 10 MG/ML IJ SOLN
INTRAMUSCULAR | Status: DC | PRN
  Administered 2012-12-06 (×2): 80 ug via INTRAVENOUS

## 2012-12-06 MED ORDER — LACTATED RINGERS IV SOLN
INTRAVENOUS | Status: DC
  Administered 2012-12-06: 11:00:00 via INTRAVENOUS

## 2012-12-06 MED ORDER — BACITRACIN ZINC 500 UNIT/GM EX OINT
TOPICAL_OINTMENT | CUTANEOUS | Status: DC | PRN
  Administered 2012-12-06: 1 via TOPICAL

## 2012-12-06 MED ORDER — VANCOMYCIN HCL 10 G IV SOLR
1500.0000 mg | Freq: Three times a day (TID) | INTRAVENOUS | Status: DC
  Administered 2012-12-07 – 2012-12-09 (×7): 1500 mg via INTRAVENOUS
  Filled 2012-12-06 (×10): qty 1500

## 2012-12-06 MED ORDER — MIDAZOLAM HCL 5 MG/5ML IJ SOLN
INTRAMUSCULAR | Status: DC | PRN
  Administered 2012-12-06: 2 mg via INTRAVENOUS

## 2012-12-06 SURGICAL SUPPLY — 63 items
BANDAGE COBAN STERILE 2 (GAUZE/BANDAGES/DRESSINGS) IMPLANT
BANDAGE CONFORM 2  STR LF (GAUZE/BANDAGES/DRESSINGS) IMPLANT
BANDAGE CONFORM 3  STR LF (GAUZE/BANDAGES/DRESSINGS) ×3 IMPLANT
BANDAGE ELASTIC 3 VELCRO ST LF (GAUZE/BANDAGES/DRESSINGS) IMPLANT
BANDAGE ELASTIC 4 VELCRO ST LF (GAUZE/BANDAGES/DRESSINGS) IMPLANT
BANDAGE GAUZE ELAST BULKY 4 IN (GAUZE/BANDAGES/DRESSINGS) ×3 IMPLANT
BLADE AVERAGE 25X9 (BLADE) IMPLANT
BLADE MINI 60D BLUE (BLADE) ×3 IMPLANT
BLADE SURG 15 STRL LF DISP TIS (BLADE) ×2 IMPLANT
BLADE SURG 15 STRL SS (BLADE) ×1
BNDG COHESIVE 4X5 TAN NS LF (GAUZE/BANDAGES/DRESSINGS) IMPLANT
BNDG COHESIVE 4X5 TAN STRL (GAUZE/BANDAGES/DRESSINGS) ×3 IMPLANT
BNDG ESMARK 4X9 LF (GAUZE/BANDAGES/DRESSINGS) IMPLANT
CHLORAPREP W/TINT 26ML (MISCELLANEOUS) ×3 IMPLANT
CLOTH BEACON ORANGE TIMEOUT ST (SAFETY) ×3 IMPLANT
CORDS BIPOLAR (ELECTRODE) ×3 IMPLANT
COVER SURGICAL LIGHT HANDLE (MISCELLANEOUS) ×3 IMPLANT
CUFF TOURNIQUET SINGLE 18IN (TOURNIQUET CUFF) ×3 IMPLANT
CUFF TOURNIQUET SINGLE 24IN (TOURNIQUET CUFF) IMPLANT
DRAPE SURG 17X23 STRL (DRAPES) ×3 IMPLANT
DRSG ADAPTIC 3X8 NADH LF (GAUZE/BANDAGES/DRESSINGS) IMPLANT
DRSG EMULSION OIL 3X3 NADH (GAUZE/BANDAGES/DRESSINGS) IMPLANT
ELECT REM PT RETURN 9FT ADLT (ELECTROSURGICAL) ×3
ELECTRODE REM PT RTRN 9FT ADLT (ELECTROSURGICAL) ×2 IMPLANT
EVACUATOR 1/8 PVC DRAIN (DRAIN) IMPLANT
GAUZE XEROFORM 1X8 LF (GAUZE/BANDAGES/DRESSINGS) IMPLANT
GLOVE BIO SURGEON STRL SZ7.5 (GLOVE) ×3 IMPLANT
GLOVE BIOGEL PI IND STRL 8 (GLOVE) ×2 IMPLANT
GLOVE BIOGEL PI INDICATOR 8 (GLOVE) ×1
GLOVE SURG SS PI 7.0 STRL IVOR (GLOVE) ×3 IMPLANT
GOWN PREVENTION PLUS XLARGE (GOWN DISPOSABLE) ×3 IMPLANT
GOWN PREVENTION PLUS XXLARGE (GOWN DISPOSABLE) ×3 IMPLANT
GOWN STRL NON-REIN LRG LVL3 (GOWN DISPOSABLE) ×9 IMPLANT
HANDPIECE INTERPULSE COAX TIP (DISPOSABLE)
KIT BASIN OR (CUSTOM PROCEDURE TRAY) ×3 IMPLANT
KIT ROOM TURNOVER OR (KITS) ×3 IMPLANT
MANIFOLD NEPTUNE II (INSTRUMENTS) IMPLANT
NEEDLE HYPO 25X1 1.5 SAFETY (NEEDLE) IMPLANT
NS IRRIG 1000ML POUR BTL (IV SOLUTION) ×3 IMPLANT
PACK ORTHO EXTREMITY (CUSTOM PROCEDURE TRAY) ×3 IMPLANT
PAD ARMBOARD 7.5X6 YLW CONV (MISCELLANEOUS) ×6 IMPLANT
PAD CAST 4YDX4 CTTN HI CHSV (CAST SUPPLIES) ×2 IMPLANT
PADDING CAST ABS 4INX4YD NS (CAST SUPPLIES)
PADDING CAST ABS COTTON 4X4 ST (CAST SUPPLIES) IMPLANT
PADDING CAST COTTON 4X4 STRL (CAST SUPPLIES) ×1
PENCIL BUTTON HOLSTER BLD 10FT (ELECTRODE) IMPLANT
SET HNDPC FAN SPRY TIP SCT (DISPOSABLE) IMPLANT
SPONGE GAUZE 4X4 12PLY (GAUZE/BANDAGES/DRESSINGS) IMPLANT
SPONGE LAP 18X18 X RAY DECT (DISPOSABLE) IMPLANT
STOCKINETTE IMPERVIOUS 9X36 MD (GAUZE/BANDAGES/DRESSINGS) IMPLANT
SUT ETHILON 3 0 FSL (SUTURE) ×3 IMPLANT
SUT ETHILON 4 0 P 3 18 (SUTURE) ×12 IMPLANT
SUT ETHILON 4 0 PS 2 18 (SUTURE) IMPLANT
SUT MNCRL AB 4-0 PS2 18 (SUTURE) IMPLANT
SUT VICRYL RAPIDE 4/0 PS 2 (SUTURE) IMPLANT
SYRINGE 10CC LL (SYRINGE) IMPLANT
TOWEL OR 17X24 6PK STRL BLUE (TOWEL DISPOSABLE) ×3 IMPLANT
TOWEL OR 17X26 10 PK STRL BLUE (TOWEL DISPOSABLE) ×3 IMPLANT
TUBE ANAEROBIC SPECIMEN COL (MISCELLANEOUS) IMPLANT
TUBE CONNECTING 12X1/4 (SUCTIONS) ×3 IMPLANT
UNDERPAD 30X30 INCONTINENT (UNDERPADS AND DIAPERS) ×3 IMPLANT
WATER STERILE IRR 1000ML POUR (IV SOLUTION) IMPLANT
YANKAUER SUCT BULB TIP NO VENT (SUCTIONS) ×3 IMPLANT

## 2012-12-06 NOTE — Progress Notes (Signed)
Inpatient Diabetes Program Recommendations  AACE/ADA: New Consensus Statement on Inpatient Glycemic Control (2013)  Target Ranges:  Prepandial:   less than 140 mg/dL      Peak postprandial:   less than 180 mg/dL (1-2 hours)      Critically ill patients:  140 - 180 mg/dL  Results for Stephen Fitzgerald, Stephen Fitzgerald (MRN [id number]) as of 12/06/2012 11:31  Ref. Range 12/05/2012 07:51 12/05/2012 12:02 12/05/2012 17:24 12/05/2012 21:58 12/06/2012 07:50  Glucose-Capillary Latest Range: 70-99 mg/dL 409 (H) 811 (H) 914 (H) 266 (H) 192 (H)    Inpatient Diabetes Program Recommendations Insulin - Basal: Please consider starting low dose basal insulin; recommend Levemir 10 units QHS. Thank you  Piedad Climes BSN, RN,CDE Inpatient Diabetes Coordinator [phone] (team pager)

## 2012-12-06 NOTE — Transfer of Care (Signed)
Immediate Anesthesia Transfer of Care Note  Patient: Stephen Fitzgerald  Procedure(s) Performed: Procedure(s): AMPUTATION DIGIT LEFT INDEX (Left) IRRIGATION AND DEBRIDEMENT EXTREMITY LEFT HAND (Left)  Patient Location: PACU  Anesthesia Type:General  Level of Consciousness: awake, alert  and oriented  Airway & Oxygen Therapy: Patient Spontanous Breathing and Patient connected to nasal cannula oxygen  Post-op Assessment: Report given to PACU RN and Post -op Vital signs reviewed and stable  Post vital signs: Reviewed and stable  Complications: No apparent anesthesia complications

## 2012-12-06 NOTE — Progress Notes (Signed)
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  No Known Allergies  Patient Measurements: Height: 6' (182.9 cm) Weight: 176 lb 9.4 oz (80.1 kg) IBW/kg (Calculated) : 77.6 Pt reported wt: 185 lbs (84kg)  Vital Signs: Temp: 98.2 F (36.8 C) (05/23 1504) BP: 148/95 mmHg (05/23 1504) Pulse Rate: 86 (05/23 1504) Intake/Output from previous day: 05/22 0701 - 05/23 0700 In: 3819.6 [P.O.:380; I.V.:2939.6; IV Piggyback:500] Out: 2575 [Urine:2575] Intake/Output from this shift:    Labs:  Recent Labs  12/04/12 0545 12/05/12 1020 12/06/12 0615  WBC 11.3* 13.4* 9.8  HGB 12.7* 11.2* 10.5*  PLT 227 226 243  CREATININE 0.53 0.65 0.56   Estimated Creatinine Clearance: 119.9 ml/min (by C-G formula based on Cr of 0.56).  Recent Labs  12/06/12 1859  VANCOTROUGH 7.0*     Microbiology: Recent Results (from the past 720 hour(s))  WOUND CULTURE     Status: None   Collection Time    12/03/12  4:01 PM      Result Value Range Status   Specimen Description WOUND HAND   Final   Special Requests NONE   Final   Gram Stain     Final   Value: NO WBC SEEN     RARE SQUAMOUS EPITHELIAL CELLS PRESENT     MODERATE GRAM POSITIVE RODS     IN PAIRS   Culture     Final   Value: MULTIPLE ORGANISMS PRESENT, NONE PREDOMINANT NO STAPHYLOCOCCUS AUREUS ISOLATED NO GROUP A STREP (S.PYOGENES) ISOLATED   Report Status 12/05/2012 FINAL   Final  SURGICAL PCR SCREEN     Status: Abnormal   Collection Time    12/03/12  9:53 PM      Result Value Range Status   MRSA, PCR NEGATIVE  NEGATIVE Final   Staphylococcus aureus POSITIVE (*) NEGATIVE Final   Comment: RESULT CALLED TO, READ BACK BY AND VERIFIED WITH:     TTHACKER(RN) BY TCLEVELAND 12/04/12 AT 1250  AFB CULTURE WITH SMEAR     Status: None   Collection Time    12/04/12 12:16 PM      Result Value Range Status   Specimen Description WOUND HAND LEFT   Final   Special Requests  PT ON ZOSYN VANCOMYCIN   Final   ACID FAST SMEAR NO ACID FAST BACILLI SEEN   Final   Culture     Final   Value: CULTURE WILL BE EXAMINED FOR 6 WEEKS BEFORE ISSUING A FINAL REPORT   Report Status PENDING   Incomplete  WOUND CULTURE     Status: None   Collection Time    12/04/12 12:16 PM      Result Value Range Status   Specimen Description WOUND HAND LEFT   Final   Special Requests PT ON ZOSYN VANCOMYCIN   Final   Gram Stain     Final   Value: NO WBC SEEN     NO SQUAMOUS EPITHELIAL CELLS SEEN     RARE GRAM POSITIVE COCCI     IN PAIRS   Culture Culture reincubated for better growth   Final   Report Status PENDING   Incomplete   Assessment: 52 y/o M with cellulitis and 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 ID following and recommends at least 3 wks of IV abx after last I&D. Pt with I&D on 5/22 and 5/23. Wound cx with multiple organisms, none predominant. Repeat wound culture gram stain with rare GPC  in pairs. WBC down to nl. Tmax 100.4.  Vancomycin trough 7 mcg/ml (subtherapeutic) on 1gm IV q12h. Noted pt with stable CrCl and good UOP.  Goal of Therapy:  Vancomycin trough level 15-20 mcg/ml  Plan:  -Change vancomycin to 1250mg  IV q8h.  -Continue Zosyn 3.375G IV q8h to be infused over 4 hours  -F/u cultures, renal function, and trough at new Css  Christoper Fabian, PharmD, BCPS Clinical pharmacist, pager [phone] 12/06/2012 9:47 PM

## 2012-12-06 NOTE — Anesthesia Preprocedure Evaluation (Addendum)
Anesthesia Evaluation  Patient identified by MRN, date of birth, ID band Patient awake    Reviewed: Allergy & Precautions, H&P , NPO status , Patient's Chart, lab work & pertinent test results  History of Anesthesia Complications Negative for: history of anesthetic complications  Airway Mallampati: I TM Distance: >3 FB Neck ROM: Full    Dental  (+) Teeth Intact and Dental Advisory Given   Pulmonary neg pulmonary ROS,  breath sounds clear to auscultation        Cardiovascular negative cardio ROS  Rhythm:Regular     Neuro/Psych CVA negative psych ROS   GI/Hepatic Neg liver ROS,   Endo/Other  diabetes  Renal/GU negative Renal ROS     Musculoskeletal negative musculoskeletal ROS (+)   Abdominal   Peds  Hematology negative hematology ROS (+)   Anesthesia Other Findings   Reproductive/Obstetrics negative OB ROS                          Anesthesia Physical Anesthesia Plan  ASA: II  Anesthesia Plan: General   Post-op Pain Management:    Induction: Intravenous  Airway Management Planned: LMA  Additional Equipment:   Intra-op Plan:   Post-operative Plan: Extubation in OR  Informed Consent: I have reviewed the patients History and Physical, chart, labs and discussed the procedure including the risks, benefits and alternatives for the proposed anesthesia with the patient or authorized representative who has indicated his/her understanding and acceptance.   Dental advisory given  Plan Discussed with: CRNA, Anesthesiologist and Surgeon  Anesthesia Plan Comments:         Anesthesia Quick Evaluation

## 2012-12-06 NOTE — Consult Note (Signed)
INFECTIOUS DISEASE CONSULT NOTE  Date of Admission:  12/03/2012  Date of Consult:  12/06/2012  Reason for Consult: L hand infection Referring Physician: Sheffield Slider  Impression/Recommendation L hand infection DM Previous CVA  Would: Continue vanco and zosyn for now. Await his Cx Check Hep C and HIV per CDC guidelines.  Comment- he has a very complicated infection of his L hand. I would aim for at least 3 weeks of IV after last I & D.  ID will f/u Cx over w/e, see if asked.   Thank you so much for this interesting consult,   Stephen Fitzgerald [phone] [email or web address]  Stephen Fitzgerald is an 52 y.o. male.  HPI: 52 yo M with untreated DM who developed swelling of his L hand so that skin overlapped his rings. This occurred for at least 2 months PTA.  He was adm on 5-20 with Temp 100.7, foul smelling pus was draining from the ring sites, and felt to be septic. He was taken to OR on 5-22 and had had I & D of L hand, removal of rings from digits 2-4.  Today he underwent repeat I & D, removal of 2nd/index finger ray and use of this to create flap for 3rd finger.  Cx from 5-20 was polymicrobial, 5-21 pending.   Past Medical History  Diagnosis Date  . Stroke   . Diabetes mellitus without complication     Past Surgical History  Procedure Laterality Date  . No past surgeries    . I&d extremity Left 12/04/2012    Procedure: IRRIGATION AND DEBRIDEMENT Left Hand with Ring Removal  times three, Carpal Tunnel , Flexor tendon Synovectomy;  Surgeon: [name], MD;  Location: [facility] OR;  Service: Orthopedics;  Laterality: Left;     No Known Allergies  Medications:  Scheduled: . aspirin  325 mg Oral Daily  . Chlorhexidine Gluconate Cloth  6 each Topical Daily  . [START ON 12/07/2012] enoxaparin (LOVENOX) injection  40 mg Subcutaneous Q24H  . folic acid  1 mg Oral Daily  . insulin aspart  0-15 Units Subcutaneous TID WC  . living well with diabetes book   Does not apply Once  .  metoprolol tartrate  25 mg Oral BID  . multivitamin with minerals  1 tablet Oral Daily  . mupirocin ointment  1 application Nasal BID  . piperacillin-tazobactam (ZOSYN)  IV  3.375 g Intravenous Q8H  . simvastatin  40 mg Oral QPM  . thiamine  100 mg Oral Daily   Or  . thiamine  100 mg Intravenous Daily  . vancomycin  1,000 mg Intravenous Q12H    Total days of antibiotics: 4 (vanco/zosyn)         Social History:  reports that he has never smoked. He does not have any smokeless tobacco history on file. He reports that  drinks alcohol. He reports that he does not use illicit drugs.  History reviewed. No pertinent family history. Parents are in good health per pt  General ROS: pt is unclear how long he has had DM. he states he has had vision exam nl, no proxiaml erythema, normal BM, normal urination, no paresthesias of UE or LE. see HPI  Blood pressure 148/95, pulse 86, temperature 98.2 F (36.8 C), temperature source Oral, resp. rate 18, height 6' (1.829 m), weight 80.1 kg (176 lb 9.4 oz), SpO2 98.00%. General appearance: alert, cooperative and no distress Eyes: negative findings: conjunctivae and sclerae normal and pupils equal, round, reactive to light and accomodation  Throat: normal findings: oropharynx pink & moist without lesions or evidence of thrush Neck: no adenopathy and supple, symmetrical, trachea midline Lungs: clear to auscultation bilaterally Heart: regular rate and rhythm Abdomen: normal findings: bowel sounds normal and soft, non-tender Extremities: edema none and LUE is wrapped. he has no proxiaml erythema or axiallry LAN.  Neurologic: Sensory: normal, he has normal light touch in the finger tips of his L hand. he has normal light touch in his feet.  There are no diabetic foot lesions.    Results for orders placed during the hospital encounter of 12/03/12 (from the past 48 hour(s))  GLUCOSE, CAPILLARY     Status: Abnormal   Collection Time    12/04/12  6:07 PM       Result Value Range   Glucose-Capillary 265 (*) 70 - 99 mg/dL  GLUCOSE, CAPILLARY     Status: Abnormal   Collection Time    12/04/12  8:51 PM      Result Value Range   Glucose-Capillary 259 (*) 70 - 99 mg/dL  GLUCOSE, CAPILLARY     Status: Abnormal   Collection Time    12/05/12  7:51 AM      Result Value Range   Glucose-Capillary 201 (*) 70 - 99 mg/dL   Comment 1 Documented in Chart     Comment 2 Notify RN    CBC WITH DIFFERENTIAL     Status: Abnormal   Collection Time    12/05/12 10:20 AM      Result Value Range   WBC 13.4 (*) 4.0 - 10.5 K/uL   RBC 3.91 (*) 4.22 - 5.81 MIL/uL   Hemoglobin 11.2 (*) 13.0 - 17.0 g/dL   HCT 16.1 (*) 09.6 - 04.5 %   MCV 85.2  78.0 - 100.0 fL   MCH 28.6  26.0 - 34.0 pg   MCHC 33.6  30.0 - 36.0 g/dL   RDW 40.9  81.1 - 91.4 %   Platelets 226  150 - 400 K/uL   Neutrophils Relative % 77  43 - 77 %   Neutro Abs 10.3 (*) 1.7 - 7.7 K/uL   Lymphocytes Relative 12  12 - 46 %   Lymphs Abs 1.6  0.7 - 4.0 K/uL   Monocytes Relative 10  3 - 12 %   Monocytes Absolute 1.4 (*) 0.1 - 1.0 K/uL   Eosinophils Relative 1  0 - 5 %   Eosinophils Absolute 0.1  0.0 - 0.7 K/uL   Basophils Relative 0  0 - 1 %   Basophils Absolute 0.0  0.0 - 0.1 K/uL  BASIC METABOLIC PANEL     Status: Abnormal   Collection Time    12/05/12 10:20 AM      Result Value Range   Sodium 132 (*) 135 - 145 mEq/L   Potassium 3.8  3.5 - 5.1 mEq/L   Chloride 97  96 - 112 mEq/L   CO2 22  19 - 32 mEq/L   Glucose, Bld 304 (*) 70 - 99 mg/dL   BUN 7  6 - 23 mg/dL   Creatinine, Ser 7.82  0.50 - 1.35 mg/dL   Calcium 8.2 (*) 8.4 - 10.5 mg/dL   GFR calc non Af Amer >90  >90 mL/min   GFR calc Af Amer >90  >90 mL/min   Comment:            The eGFR has been calculated     using the CKD EPI equation.     This  calculation has not been     validated in all clinical     situations.     eGFR's persistently     <90 mL/min signify     possible Chronic Kidney Disease.  GLUCOSE, CAPILLARY     Status:  Abnormal   Collection Time    12/05/12 12:02 PM      Result Value Range   Glucose-Capillary 264 (*) 70 - 99 mg/dL   Comment 1 Documented in Chart     Comment 2 Notify RN    GLUCOSE, CAPILLARY     Status: Abnormal   Collection Time    12/05/12  5:24 PM      Result Value Range   Glucose-Capillary 290 (*) 70 - 99 mg/dL   Comment 1 Documented in Chart     Comment 2 Notify RN    GLUCOSE, CAPILLARY     Status: Abnormal   Collection Time    12/05/12  9:58 PM      Result Value Range   Glucose-Capillary 266 (*) 70 - 99 mg/dL  CBC     Status: Abnormal   Collection Time    12/06/12  6:15 AM      Result Value Range   WBC 9.8  4.0 - 10.5 K/uL   RBC 3.63 (*) 4.22 - 5.81 MIL/uL   Hemoglobin 10.5 (*) 13.0 - 17.0 g/dL   HCT 40.9 (*) 81.1 - 91.4 %   MCV 86.2  78.0 - 100.0 fL   MCH 28.9  26.0 - 34.0 pg   MCHC 33.5  30.0 - 36.0 g/dL   RDW 78.2  95.6 - 21.3 %   Platelets 243  150 - 400 K/uL  BASIC METABOLIC PANEL     Status: Abnormal   Collection Time    12/06/12  6:15 AM      Result Value Range   Sodium 136  135 - 145 mEq/L   Potassium 3.9  3.5 - 5.1 mEq/L   Chloride 103  96 - 112 mEq/L   CO2 23  19 - 32 mEq/L   Glucose, Bld 223 (*) 70 - 99 mg/dL   BUN 7  6 - 23 mg/dL   Creatinine, Ser 0.86  0.50 - 1.35 mg/dL   Calcium 8.4  8.4 - 57.8 mg/dL   GFR calc non Af Amer >90  >90 mL/min   GFR calc Af Amer >90  >90 mL/min   Comment:            The eGFR has been calculated     using the CKD EPI equation.     This calculation has not been     validated in all clinical     situations.     eGFR's persistently     <90 mL/min signify     possible Chronic Kidney Disease.  GLUCOSE, CAPILLARY     Status: Abnormal   Collection Time    12/06/12  7:50 AM      Result Value Range   Glucose-Capillary 192 (*) 70 - 99 mg/dL   Comment 1 Documented in Chart     Comment 2 Notify RN    GLUCOSE, CAPILLARY     Status: Abnormal   Collection Time    12/06/12  2:00 PM      Result Value Range    Glucose-Capillary 207 (*) 70 - 99 mg/dL   Comment 1 Notify RN        Component Value Date/Time   SDES WOUND HAND LEFT  12/04/2012 1216   SDES WOUND HAND LEFT 12/04/2012 1216   SPECREQUEST PT ON ZOSYN VANCOMYCIN 12/04/2012 1216   SPECREQUEST PT ON ZOSYN VANCOMYCIN 12/04/2012 1216   CULT CULTURE WILL BE EXAMINED FOR 6 WEEKS BEFORE ISSUING A FINAL REPORT 12/04/2012 1216   CULT Culture reincubated for better growth 12/04/2012 1216   REPTSTATUS PENDING 12/04/2012 1216   REPTSTATUS PENDING 12/04/2012 1216   No results found. Recent Results (from the past 240 hour(s))  WOUND CULTURE     Status: None   Collection Time    12/03/12  4:01 PM      Result Value Range Status   Specimen Description WOUND HAND   Final   Special Requests NONE   Final   Gram Stain     Final   Value: NO WBC SEEN     RARE SQUAMOUS EPITHELIAL CELLS PRESENT     MODERATE GRAM POSITIVE RODS     IN PAIRS   Culture     Final   Value: MULTIPLE ORGANISMS PRESENT, NONE PREDOMINANT NO STAPHYLOCOCCUS AUREUS ISOLATED NO GROUP A STREP (S.PYOGENES) ISOLATED   Report Status 12/05/2012 FINAL   Final  SURGICAL PCR SCREEN     Status: Abnormal   Collection Time    12/03/12  9:53 PM      Result Value Range Status   MRSA, PCR NEGATIVE  NEGATIVE Final   Staphylococcus aureus POSITIVE (*) NEGATIVE Final   Comment: RESULT CALLED TO, READ BACK BY AND VERIFIED WITH:     TTHACKER(RN) BY TCLEVELAND 12/04/12 AT 1250  AFB CULTURE WITH SMEAR     Status: None   Collection Time    12/04/12 12:16 PM      Result Value Range Status   Specimen Description WOUND HAND LEFT   Final   Special Requests PT ON ZOSYN VANCOMYCIN   Final   ACID FAST SMEAR NO ACID FAST BACILLI SEEN   Final   Culture     Final   Value: CULTURE WILL BE EXAMINED FOR 6 WEEKS BEFORE ISSUING A FINAL REPORT   Report Status PENDING   Incomplete  WOUND CULTURE     Status: None   Collection Time    12/04/12 12:16 PM      Result Value Range Status   Specimen Description WOUND HAND LEFT    Final   Special Requests PT ON ZOSYN VANCOMYCIN   Final   Gram Stain     Final   Value: NO WBC SEEN     NO SQUAMOUS EPITHELIAL CELLS SEEN     RARE GRAM POSITIVE COCCI     IN PAIRS   Culture Culture reincubated for better growth   Final   Report Status PENDING   Incomplete      12/06/2012, 4:10 PM     LOS: 3 days

## 2012-12-06 NOTE — Progress Notes (Signed)
I have seen and examined this patient. I have discussed with Dr Birdie Sons.  I agree with their findings and plans as documented in their progress note. 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  I ordered start of intermediate dose of glipizide.  Will hold off restarting patient's metformin until he is nearing discharge.  Patient was on a thiazolidinedione.  May restart once clinically stable.   Per the patient's father, four years ago the patient had a interpersonal conflict with a diabetes instructor who was trying to instruct Stephen Fitzgerald in the self-injection of insulin.  This conflict lead to Stephen Fitzgerald being dismissed from his primary care provider's practice.   Patient has attempted self-injection of insulin in past with excessive bruising likely from his difficulty with technique.   The patient will need someone to administer any prescribed insulin injectable, per patient's father, if insulin is prescribed as outpatient at discharge.  His father said he would be willing to pay for someone to administer the insulin, but this would be a significant financial burden on him.

## 2012-12-06 NOTE — Op Note (Signed)
12/03/2012 - 12/06/2012  1:55 PM  PATIENT:  Stephen Fitzgerald  52 y.o. 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 Ray amputation of the index ray with formation of a fillet flap of the skin, with insetting of the flap to the ring finger wound.  SURGEON: Cliffton Asters. Janee Morn, MD  PHYSICIAN ASSISTANT: None  ANESTHESIA:  general  SPECIMENS:  To pathology  DRAINS:   None  PREOPERATIVE INDICATIONS:  Stephen Fitzgerald is a  52 y.o. male with a severe left hand infection. He has chronic ring avulsion injuries across the bases of the index, long, and ring fingers, with exposed flexor tendon for just over a centimeter involving the index and ring fingers. He has undergone ring removal already, excisional debridement and radical flexor tenosynovectomy. I discussed with him the plan to essentially amputate the index finger but try to salvage parts from it to cover the ring finger wounds.  The risks benefits and alternatives were discussed with the patient preoperatively including but not limited to the risks of infection, bleeding, nerve injury, cardiopulmonary complications, the need for revision surgery, among others, and the patient verbalized understanding and consented to proceed.  OPERATIVE IMPLANTS: None  OPERATIVE FINDINGS:  The wounds themselves actually were much cleaner, with no gross pus. After some additional excisional debridement and irrigation, the fillet flap was created. He could just barely reach where needed to go to be and sent to the ring finger and so the decision was made not to tubular eyes are partially closed this today and insetting a later date. I was concerned that the flap will become thick, woody, and insufficiently mobile. Decision was made  to inset the flap today, accepting the inherent risks.  OPERATIVE PROCEDURE:  The patient was escorted to the operative theatre and placed in a supine position. General anesthesia was administered. A surgical "time-out" was performed during which the planned procedure, proposed operative site, and the correct patient identity were compared to the operative consent and agreement confirmed by the circulating nurse according to current facility policy.  Following application of a tourniquet to the operative extremity, the exposed skin was pre-scrubbed with a Hibiclens scrub brush before being formally prepped with Chloraprep and draped in the usual sterile fashion.  The limb was exsanguinated with gravity and the tourniquet inflated to approximately higher than systolic BP.  All the distal sutures back to the distal wrist crease were removed and the wound is opened. Some additional excisional debridement of skin, subcutaneous tissue, and tendon fashion was performed. There was no gross purulence. This was irrigated copiously. The index and ring finger wounds were treated similarly. Decision was made to proceed with amputation of the index finger as a ray amputation, forming a fillet flap for attempted two-stage closure of the ring wound.  The bones were excised staying close the periosteum trying to protect and preserve all blood flow in the flap. The flap was dorsally based, essentially creating a volar incision on the index finger connecting to the open wound at its base. As a skeleton was excised, the metacarpal was divided obliquely in its midportion and all the skeletal and joint elements removed. Everything was copiously irrigated and the flap was advanced across the base of the long finger to cover the ring finger.  Tourniquet was released there appeared to be some punctate blood flow at the distal in the flap and the flap was inset with 4-0 nylon suture on the volar surface of the ring finger and its  ulnar surface. The palmar wound was closed after the flexor tendons to the index finger were divided away back in zone 5. All the wounds were dressed with bacitracin, Adaptic, gauze and a fluffy loose dressing. A hole was made in the dressing to observe the flap. He was awakened and taken to room stable condition. The flap itself initially was a little dusky but on the hole in the dressing was made it appeared to be a more normal color.  DISPOSITION: He will be returned to the floor for continued IV antibiotics, elevation, and I will plan to look at this more closely on Monday. If the flap should fail, we will simply revised the index ray amputation and consider other options for coverage at the base the ring finger. If it proves hard enough to survive, in approximately 2 weeks we will divide and inset at the ring finger and revised the wound closure of the index ray amputation.

## 2012-12-06 NOTE — Anesthesia Procedure Notes (Signed)
Anesthesia Regional Block:  Supraclavicular block  Pre-Anesthetic Checklist: ,, timeout performed, Correct Patient, Correct Site, Correct Laterality, Correct Procedure, Correct Position, site marked, Risks and benefits discussed,  Surgical consent,  Pre-op evaluation,  At surgeon's request and post-op pain management   Prep: chloraprep       Needles:  Injection technique: Single-shot  Needle Type: Echogenic Stimulator Needle     Needle Length: 5cm 5 cm Needle Gauge: 21    Additional Needles:  Procedures: ultrasound guided (picture in chart) Supraclavicular block Narrative:  Injection made incrementally with aspirations every 5 mL.  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  He had a vascular issue in the past in the area.  I was unable to see the needle to safely give the anesthetic.  Supraclavicular block

## 2012-12-06 NOTE — Progress Notes (Signed)
Family Medicine Teaching Service Daily Progress Note Service Page: [phone]  Patient Assessment: Stephen Fitzgerald is a 52 y.o. year old male with a history of untreated diabetes presenting with frank infection of his left hand and fingers from imbedded rings.  Subjective: Pain continues to be well controlled. Had question of whether or not the white gold in the rings caused this issue.  Objective: Temp:  [98.9 F (37.2 C)-100.4 F (38 C)] 98.9 F (37.2 C) (05/23 0515) Pulse Rate:  [105-123] 105 (05/23 0515) Resp:  [18-20] 18 (05/23 0515) BP: (125-141)/(73-84) 141/84 mmHg (05/23 0515) SpO2:  [96 %-98 %] 98 % (05/23 0515) Exam: General: NAD Cardiovascular: RRR Respiratory: NWOB, CTAB  Abdomen: soft, nontender to palpation Extremities: SCD's in place, calves nontender to palpation. L hand and forearm heavily wrapped. Sensation to light touch intact on fingertips of all fingers of left hand. Brisk capillary refill on all fingers. Some blood drainage apparent on the inside of the dressing. Neuro: grossly nonfocal, speech intact  I have reviewed the patient's medications, labs, imaging, and diagnostic testing.  Notable results are summarized below.  Medications:  Scheduled Meds: . aspirin  325 mg Oral Daily  . Chlorhexidine Gluconate Cloth  6 each Topical Daily  . folic acid  1 mg Oral Daily  . insulin aspart  0-15 Units Subcutaneous TID WC  . living well with diabetes book   Does not apply Once  . metoprolol tartrate  25 mg Oral BID  . multivitamin with minerals  1 tablet Oral Daily  . mupirocin ointment  1 application Nasal BID  . piperacillin-tazobactam (ZOSYN)  IV  3.375 g Intravenous Q8H  . simvastatin  40 mg Oral QPM  . thiamine  100 mg Oral Daily   Or  . thiamine  100 mg Intravenous Daily  . vancomycin  1,000 mg Intravenous Q12H   Continuous Infusions: . sodium chloride 1,000 mL (12/06/12 0007)   PRN Meds:.acetaminophen, acetaminophen, hydrALAZINE, HYDROmorphone  (DILAUDID) injection, LORazepam, LORazepam, meperidine (DEMEROL) injection, oxyCODONE-acetaminophen, promethazine  Labs:  CBC  Recent Labs Lab 12/04/12 0545 12/05/12 1020 12/06/12 0615  WBC 11.3* 13.4* 9.8  HGB 12.7* 11.2* 10.5*  HCT 37.7* 33.3* 31.3*  PLT 227 226 243    BMET  Recent Labs Lab 12/04/12 0545 12/05/12 1020 12/06/12 0615  NA 135 132* 136  K 3.7 3.8 3.9  CL 101 97 103  CO2 23 22 23   BUN 8 7 7   CREATININE 0.53 0.65 0.56  GLUCOSE 262* 304* 223*  CALCIUM 8.6 8.2* 8.4    Lipid Panel     Component Value Date/Time   CHOL 131 12/04/2012 0545   TRIG 76 12/04/2012 0545   HDL 36* 12/04/2012 0545   CHOLHDL 3.6 12/04/2012 0545   VLDL 15 12/04/2012 0545   LDLCALC 80 12/04/2012 0545    CBG (last 3)   Recent Labs  12/05/12 1724 12/05/12 2158 12/06/12 0750  GLUCAP 290* 266* 192*  SSI in last 24: 21 units   Sed rate 70  Lactic acid 1.64  Hgb A1c 9.0  Wound cx 5/20 ED: multiple organisms present, none predominant (no staph aureus, no group A strep) Wound cx 5/21 OR: no growth x 1 day  Imaging/Diagnostic Tests:  L Hand Xray:  No acute fracture or subluxation. Soft tissue swelling mid and distal aspect second third and fourth finger. Limited study by metallic rings.  Assessment/Plan:  Stephen Fitzgerald is a 47 y.o. year old male with a history of untreated diabetes presenting  with frank infection of his left hand and fingers from imbedded rings. Febrile to 100.7 and tachycardia present so patient met sepsis criteria. Overall clinically well-appearing except for his localized infection.   ID:  # Hand infection/sepsis:  Continues to be tachycardic but now afebrile.  - s/p surgery on 5/21 - no blood cx obtained as pt already had a dose of ancef in ER, but did obtain wound culture - broad spectrum abx coverage with Vanc/Zosyn - dosed per pharmacy  - anticipate need for long term IV antibiotics. Will follow clinically for improvement  - consult infectious  disease for long term mgmt plan (IV vs po abx at d/c)  - surgery to take back to the OR today for further I&D with attempt to salvage ring finger with sacrifice index finger - tylenol PRN fever, dilaudid/oxy prn pain - IV fluids  - will plan for PICC placement post-surgery given likely need for IV antibiotics  ENDO:  # Diabetes: glucose >500 on BMET on admission. Chronic and untreated.  - A1c 9.0 - begin with moderate SSI (pt's father reports he was previously on 2 po agents, never required SQ insulin)  - will start lantus 10 after patient surgery today - carb modified diet  # Hyperlipidemia - pt is on statin at home (simva 40 daily) but this is given to him by his father, not prescribed for him by a doctor  - continue this dose for now  - lipids appear well controlled (LDL 80)  CARDIOVASCULAR:  # Elevated BP's:  - improved this morning after initiating metoprolol 25mg  BID yesterday, continue this regimen # Tachycardia: likely related to patients infection and recent surgery - will continue to monitor, would be hesitant to add additional agent to control this in setting of surgery today  NEURO:  # Hx of stroke:  - continue aspirin daily   PSYCH:  # Alcohol Use: reports drinking several beers per day  - will observe with CIWA protocol and prn ativan  - CIWA scores thus far 0-2 - UDS negative # Behavior:  - Pt exhibits some strange behaviors and does not seem to fully appreciate the severity of his infection. Has gone for months likely with frank infection of his hand, without seeking care. Does not seem to pick up on social cues and is very difficult to redirect, getting fixated on certain topics throughout conversation. This behavior along with the hx provided by his father of a neat and orderly apartment suggests possible autism spectrum disorder. Will treat patient's immediate medical needs now (hand infection and diabetes) and consult psychiatry if need arises in the future.    FEN/GI:  - NS @ 125 cc/hr  - carb modified  SOCIAL:  # Dispo:  - pending clinical improvement  - will likely require prolonged IV antibiotic treatment through a PICC line, so SNF placement may be necessary in light of pt's social situation  # Code Status:  -did not discuss, presume full code  Marikay Alar, MD [facility] Medicine PGY-1 Service Pager [phone]

## 2012-12-06 NOTE — Preoperative (Signed)
Beta Blockers   Reason not to administer Beta Blockers:Not Applicable 

## 2012-12-06 NOTE — Care Management Note (Addendum)
    Page 1 of 2   12/12/2012     11:10:18 AM   CARE MANAGEMENT NOTE 12/12/2012  Patient:  Stephen Fitzgerald, Stephen Fitzgerald   Account Number:  [id number]  Date Initiated:  12/04/2012  Documentation initiated by:  Jiles Crocker  Subjective/Objective Assessment:   ADMITTED WITH LEFT HAND INFECTION; TO SURGERY FOR IRRIGATION AND DEBRIDEMENT Left Hand with Ring Removal times three     Action/Plan:   LIVES ALONE, PATIENT FATHER ASSIST WITH HIS CARE AS NEEDED; CM FOLLOWING FOR DCP  pt/ot eval- no pt needs.   Anticipated DC Date:  12/12/2012   Anticipated DC Plan:  SKILLED NURSING FACILITY  In-house referral  Clinical Social Worker      DC Planning Services  CM consult      Choice offered to / List presented to:             Status of service:  Completed, signed off Medicare Important Message given?  NA - LOS <3 / Initial given by admissions (If response is "NO", the following Medicare IM given date fields will be blank) Date Medicare IM given:   Date Additional Medicare IM given:    Discharge Disposition:  SKILLED NURSING FACILITY  Per UR Regulation:  Reviewed for med. necessity/level of care/duration of stay  If discussed at Long Length of Stay Meetings, dates discussed:    Comments:  12/12/12 11:08 Letha Cape RN, BSN [phone] patient has some bed offers for snf, per CSW, Patient is for dc today.  12/11/12  14:25 Letha Cape RN, BSN [phone] MD does not feel that the patient will be safe at home alone  with his cognitive status.  MD spoke with CSW , she will try to get patient placed in snf.  NCM will contune to follow.  12/11/12 11:50 Letha Cape RN, BSN [phone] patient lives alone, should be ready for dc today, spoke with MD, OT saw patient today and she agreed, patient is not homebound , he lives 5 minutes away from his parents, who are willing to help patient with getting his meals and his groceries.  Patient's father will bring his loose fitting boots here today so that he  can slip those on easier.  Patient has medication coverage and transportation.  12/06/12 17:58 Letha Cape RN, BSN [phone] patient s/p I and D , NCM will continue to follow for dc needs.  12/04/2012- B CHANDLER RN,BSN,MHA

## 2012-12-07 DIAGNOSIS — E119 Type 2 diabetes mellitus without complications: Secondary | ICD-10-CM | POA: Diagnosis not present

## 2012-12-07 DIAGNOSIS — L02519 Cutaneous abscess of unspecified hand: Secondary | ICD-10-CM | POA: Diagnosis not present

## 2012-12-07 LAB — GLUCOSE, CAPILLARY
Glucose-Capillary: 197 mg/dL — ABNORMAL HIGH (ref 70–99)
Glucose-Capillary: 250 mg/dL — ABNORMAL HIGH (ref 70–99)

## 2012-12-07 LAB — HEPATITIS C ANTIBODY: HCV Ab: NEGATIVE

## 2012-12-07 LAB — HIV ANTIBODY (ROUTINE TESTING W REFLEX): HIV: NONREACTIVE

## 2012-12-07 MED ORDER — POLYETHYLENE GLYCOL 3350 17 G PO PACK
17.0000 g | PACK | Freq: Two times a day (BID) | ORAL | Status: DC
  Administered 2012-12-08 – 2012-12-12 (×7): 17 g via ORAL
  Filled 2012-12-07 (×11): qty 1

## 2012-12-07 MED ORDER — BISACODYL 5 MG PO TBEC: 5.0000 mg | DELAYED_RELEASE_TABLET | Freq: Every day | ORAL | Status: DC | PRN

## 2012-12-07 NOTE — Progress Notes (Signed)
I have seen and examined this patient. I have discussed with Dr Pollie Meyer.  I agree with their findings and plans as documented in their progress note.  Of note, [name] does not believe he has diabetes and as such may be resistant to any antidiabetic medications at discharge.  He says he will not check his blood sugars at home, nor will he allow outpatient providers to check his A1c. I believe he will not take insulin injections at discharge, and may not be willing to continue oral antidiabetic medications at discharge as well.  Will see.   I am uncertain the true origins for his resistance other than confrontational interactions with providers in the past around diabetes and possibly adverse reactions to medications in the past in either himself or other family members.  Attempts to engage him in the issue of diabetes appears to agitate him.  This health issue may best be addressed after he has followed up and established a therapeutic relation with a PCP.  [name] may then be able to hear the information about his diabetes and its treatment then.

## 2012-12-07 NOTE — Progress Notes (Signed)
Family Medicine Teaching Service Daily Progress Note Service Pager: [phone]  Patient Assessment: Stephen Fitzgerald is a 52 y.o. year old male with a history of untreated diabetes presenting with frank infection of his left hand and fingers from imbedded rings.  Subjective: Reports pain is well controlled. Just has an itching sensation in his fingers from time to time.  Objective: Temp:  [97.9 F (36.6 C)-100.2 F (37.9 C)] 97.9 F (36.6 C) (05/24 0558) Pulse Rate:  [46-107] 93 (05/24 0558) Resp:  [13-31] 20 (05/24 0558) BP: (120-152)/(57-97) 120/86 mmHg (05/24 0558) SpO2:  [78 %-100 %] 94 % (05/24 0558) Exam: General: NAD Cardiovascular: RRR Respiratory: NWOB, coarse breath sounds throughout bilaterally Abdomen: soft, nontender to palpation Extremities: SCD's in place, calves nontender to palpation. L hand and forearm heavily wrapped. Neuro: grossly nonfocal, speech intact  I have reviewed the patient's medications, labs, imaging, and diagnostic testing.  Notable results are summarized below.  Medications:  Scheduled Meds: . aspirin  325 mg Oral Daily  . Chlorhexidine Gluconate Cloth  6 each Topical Daily  . enoxaparin (LOVENOX) injection  40 mg Subcutaneous Q24H  . folic acid  1 mg Oral Daily  . glipiZIDE  5 mg Oral BID AC  . insulin aspart  0-15 Units Subcutaneous TID WC  . living well with diabetes book   Does not apply Once  . metoprolol tartrate  25 mg Oral BID  . multivitamin with minerals  1 tablet Oral Daily  . mupirocin ointment  1 application Nasal BID  . piperacillin-tazobactam (ZOSYN)  IV  3.375 g Intravenous Q8H  . simvastatin  40 mg Oral QPM  . thiamine  100 mg Oral Daily   Or  . thiamine  100 mg Intravenous Daily  . vancomycin  1,500 mg Intravenous Q8H   Continuous Infusions: . sodium chloride 1,000 mL (12/06/12 0842)   PRN Meds:.acetaminophen, acetaminophen, hydrALAZINE, HYDROmorphone (DILAUDID) injection,  oxyCODONE-acetaminophen  Labs:  CBC  Recent Labs Lab 12/04/12 0545 12/05/12 1020 12/06/12 0615  WBC 11.3* 13.4* 9.8  HGB 12.7* 11.2* 10.5*  HCT 37.7* 33.3* 31.3*  PLT 227 226 243    BMET  Recent Labs Lab 12/04/12 0545 12/05/12 1020 12/06/12 0615  NA 135 132* 136  K 3.7 3.8 3.9  CL 101 97 103  CO2 23 22 23   BUN 8 7 7   CREATININE 0.53 0.65 0.56  GLUCOSE 262* 304* 223*  CALCIUM 8.6 8.2* 8.4    Lipid Panel     Component Value Date/Time   CHOL 131 12/04/2012 0545   TRIG 76 12/04/2012 0545   HDL 36* 12/04/2012 0545   CHOLHDL 3.6 12/04/2012 0545   VLDL 15 12/04/2012 0545   LDLCALC 80 12/04/2012 0545    CBG (last 3)   Recent Labs  12/06/12 1728 12/06/12 2201 12/07/12  GLUCAP 183* 270* 258*  SSI in last 24: 6 units  Sed rate 70  Lactic acid 1.64  Hgb A1c 9.0  Wound cx 5/20 ED: multiple organisms present, none predominant (no staph aureus, no group A strep) Wound cx 5/21 OR: reincubated for better growth AFB cx 5/21: no growth to date  HIV pending HCV pending  Imaging/Diagnostic Tests:  L Hand Xray:  No acute fracture or subluxation. Soft tissue swelling mid and distal aspect second third and fourth finger. Limited study by metallic rings.  Assessment/Plan:  Stephen Fitzgerald is a 54 y.o. year old male with a history of untreated diabetes presenting with frank infection of his left hand and  fingers from imbedded rings. Febrile to 100.7 and tachycardia present so patient met sepsis criteria. Overall clinically well-appearing except for his localized infection.   ID:  # Hand infection/sepsis:  Continues to be tachycardic but now afebrile.  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 Chronic and untreated. Needle aversion makes insulin not ideal for long term treatment. - A1c 9.0 - started glipizide 5mg  on 5/24, anticipate starting metformin closer to discharge - continue moderate SSI for additional coverage - carb modified diet  # Hyperlipidemia - pt is on statin at home (simva 40 daily) but this is given to him by his father, not prescribed for him by a doctor  - continue this dose for now  - lipids appear well controlled (LDL 80)  CARDIOVASCULAR:  # Elevated BP's:  - improved after initiating metoprolol 25mg  BID, continue this regimen # Tachycardia: likely related to patients infection and recent surgery - seems improved this AM, continue to monitor  NEURO:  # Hx of stroke:  - continue aspirin daily   PSYCH:  # Alcohol Use: reports drinking several beers per day  - will observe with CIWA protocol and prn ativan  - CIWA scores thus far 0-2 - UDS negative # Behavior:  - Pt exhibits some strange behaviors and does not seem to fully appreciate the severity of his infection. Has gone for months likely with frank infection of his hand, without seeking care. Does not seem to pick up on social cues and is very difficult to redirect, getting fixated on certain topics throughout conversation. This behavior along with the hx provided by his father of a neat and orderly apartment suggests possible autism spectrum disorder. Will treat patient's immediate medical needs now (hand infection and diabetes) and consult psychiatry if need arises in the future.   FEN/GI:  - NS @ 125 cc/hr  - carb modified  SOCIAL:  # Dispo:  - pending clinical improvement  - will require prolonged IV antibiotic treatment through a PICC line, so SNF placement may be necessary  in light of pt's social situation  # Code Status:  -did not discuss, presume full code  Levert Feinstein, MD [facility] Medicine PGY-1 Service Pager [phone]

## 2012-12-08 DIAGNOSIS — L02512 Cutaneous abscess of left hand: Secondary | ICD-10-CM

## 2012-12-08 DIAGNOSIS — IMO0002 Reserved for concepts with insufficient information to code with codable children: Secondary | ICD-10-CM

## 2012-12-08 DIAGNOSIS — I6990 Unspecified sequelae of unspecified cerebrovascular disease: Secondary | ICD-10-CM

## 2012-12-08 DIAGNOSIS — R4701 Aphasia: Secondary | ICD-10-CM | POA: Diagnosis present

## 2012-12-08 DIAGNOSIS — M65949 Unspecified synovitis and tenosynovitis, unspecified hand: Secondary | ICD-10-CM

## 2012-12-08 DIAGNOSIS — M659 Synovitis and tenosynovitis, unspecified: Secondary | ICD-10-CM | POA: Diagnosis present

## 2012-12-08 DIAGNOSIS — E119 Type 2 diabetes mellitus without complications: Secondary | ICD-10-CM | POA: Diagnosis not present

## 2012-12-08 DIAGNOSIS — E1165 Type 2 diabetes mellitus with hyperglycemia: Secondary | ICD-10-CM

## 2012-12-08 DIAGNOSIS — E118 Type 2 diabetes mellitus with unspecified complications: Secondary | ICD-10-CM | POA: Diagnosis present

## 2012-12-08 DIAGNOSIS — L03119 Cellulitis of unspecified part of limb: Secondary | ICD-10-CM | POA: Diagnosis not present

## 2012-12-08 HISTORY — DX: Unspecified synovitis and tenosynovitis, unspecified hand: M65.949

## 2012-12-08 HISTORY — DX: Synovitis and tenosynovitis, unspecified: M65.9

## 2012-12-08 HISTORY — DX: Cutaneous abscess of left hand: L02.512

## 2012-12-08 LAB — WOUND CULTURE: Gram Stain: NONE SEEN

## 2012-12-08 LAB — VANCOMYCIN, TROUGH: Vancomycin Tr: 18.8 ug/mL (ref 10.0–20.0)

## 2012-12-08 MED ORDER — DEXTROSE 5 % IV SOLN
2.0000 g | Freq: Three times a day (TID) | INTRAVENOUS | Status: DC
  Administered 2012-12-08 – 2012-12-09 (×3): 2 g via INTRAVENOUS
  Filled 2012-12-08 (×6): qty 2

## 2012-12-08 NOTE — Progress Notes (Signed)
Family Medicine Teaching Service Daily Progress Note Service Pager: [phone]  Patient Assessment: Stephen Fitzgerald is a 52 y.o. year old male with a history of untreated diabetes presenting with frank infection of his left hand and fingers from imbedded rings.  Subjective: Reports pain is well controlled with pain medicine.  Objective: Temp:  [98 F (36.7 C)-98.6 F (37 C)] 98 F (36.7 C) (05/25 0420) Pulse Rate:  [91-109] 95 (05/25 0420) Resp:  [18-20] 18 (05/25 0420) BP: (97-144)/(65-81) 144/81 mmHg (05/25 0420) SpO2:  [96 %-97 %] 97 % (05/25 0420) Weight:  [177 lb 7.5 oz (80.5 kg)] 177 lb 7.5 oz (80.5 kg) (05/25 0420) Exam: General: NAD, asleep in bed, awakens to voice Cardiovascular: RRR Respiratory: NWOB, CTAB Abdomen: soft, nontender to palpation Extremities: SCD's in place, calves nontender to palpation. L hand and forearm heavily wrapped, did not unwrap. Neuro: grossly nonfocal, speech intact  I have reviewed the patient's medications, labs, imaging, and diagnostic testing.  Notable results are summarized below.  Medications:  Scheduled Meds: . aspirin  325 mg Oral Daily  . enoxaparin (LOVENOX) injection  40 mg Subcutaneous Q24H  . folic acid  1 mg Oral Daily  . glipiZIDE  5 mg Oral BID AC  . insulin aspart  0-15 Units Subcutaneous TID WC  . living well with diabetes book   Does not apply Once  . metoprolol tartrate  25 mg Oral BID  . multivitamin with minerals  1 tablet Oral Daily  . mupirocin ointment  1 application Nasal BID  . piperacillin-tazobactam (ZOSYN)  IV  3.375 g Intravenous Q8H  . polyethylene glycol  17 g Oral BID  . simvastatin  40 mg Oral QPM  . thiamine  100 mg Oral Daily  . vancomycin  1,500 mg Intravenous Q8H   Continuous Infusions:   PRN Meds:.acetaminophen, acetaminophen, bisacodyl, hydrALAZINE, HYDROmorphone (DILAUDID) injection, oxyCODONE-acetaminophen  Labs:  CBC  Recent Labs Lab 12/04/12 0545 12/05/12 1020 12/06/12 0615   WBC 11.3* 13.4* 9.8  HGB 12.7* 11.2* 10.5*  HCT 37.7* 33.3* 31.3*  PLT 227 226 243    BMET  Recent Labs Lab 12/04/12 0545 12/05/12 1020 12/06/12 0615  NA 135 132* 136  K 3.7 3.8 3.9  CL 101 97 103  CO2 23 22 23   BUN 8 7 7   CREATININE 0.53 0.65 0.56  GLUCOSE 262* 304* 223*  CALCIUM 8.6 8.2* 8.4    Lipid Panel     Component Value Date/Time   CHOL 131 12/04/2012 0545   TRIG 76 12/04/2012 0545   HDL 36* 12/04/2012 0545   CHOLHDL 3.6 12/04/2012 0545   VLDL 15 12/04/2012 0545   LDLCALC 80 12/04/2012 0545    CBG (last 3)   Recent Labs  12/07/12 1157 12/07/12 1705 12/07/12 2129  GLUCAP 241* 238* 250*  SSI in last 24: 13 units  Sed rate 70  Lactic acid 1.64  Hgb A1c 9.0  Wound cx 5/20 ED: multiple organisms present, none predominant (no staph aureus, no group A strep) Wound cx 5/21 OR: few gram negative rods, final pending AFB cx 5/21: no growth to date  HIV negative HCV negative  Imaging/Diagnostic Tests:  L Hand Xray:  No acute fracture or subluxation. Soft tissue swelling mid and distal aspect second third and fourth finger. Limited study by metallic rings.  Assessment/Plan:  Stephen Fitzgerald is a 89 y.o. year old male with a history of untreated diabetes presenting with frank infection of his left hand and fingers from imbedded  rings. Febrile to 100.7 and tachycardia present on admission so patient met sepsis criteria. Overall clinically well-appearing except for his localized infection.   ID:  # Hand infection/sepsis:  Continues to be tachycardic but now afebrile.  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 Chronic and untreated. Needle aversion makes insulin not ideal for long term treatment. - A1c 9.0 - started glipizide 5mg  on 5/24, anticipate starting metformin closer to discharge - continue moderate SSI for additional coverage - carb modified diet  # Hyperlipidemia - pt is on statin at home (simva 40 daily) but this is given to him by his father, not prescribed for him by a doctor  - continue this dose for now  - lipids appear well controlled (LDL 80)  CARDIOVASCULAR:  # Elevated BP's:  - improved after initiating metoprolol 25mg  BID, continue this regimen # Tachycardia: likely related to patients infection and recent surgery - less tachycardic currently, continue to monitor  NEURO:  # Hx of stroke:  - continue aspirin daily   PSYCH:  # Alcohol Use: reports drinking several beers per day  - will observe with CIWA protocol and prn ativan  - CIWA scores thus far 0-2 - UDS negative # Behavior:  - Pt exhibits some strange behaviors and does not seem to fully appreciate the severity of his infection. Has gone for months likely with frank infection of his hand, without seeking care. Does not seem to pick up on social cues and is very difficult to redirect, getting fixated on certain topics throughout conversation. This behavior along with the hx provided by his father of a neat and orderly apartment suggests possible autism spectrum disorder. Will treat patient's immediate medical needs now (hand infection and diabetes) and consult psychiatry if need arises in the future.   FEN/GI:  - SLIV - carb modified  SOCIAL:  # Dispo:  - pending clinical improvement  - will require prolonged IV antibiotic treatment through a PICC line, so SNF placement may be necessary in light of pt's social situation  # Code Status:  -did  not discuss, presume full code  Levert Feinstein, MD [facility] Medicine PGY-1 Service Pager [phone]

## 2012-12-08 NOTE — Progress Notes (Signed)
I have seen and examined this patient. I have discussed with Dr Pollie Meyer.  I agree with their findings and plans as documented in their progress note.  Stable after surgical intervention for hand/finger infection.  Plan PICC placement for prolonged IV antibiotics. Adequate glycemic control with Glipizide 5 mg TWICE DAILY and SSI.  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

## 2012-12-08 NOTE — Progress Notes (Signed)
ANTIBIOTIC CONSULT NOTE - FOLLOW UP  Pharmacy Consult for vancomycin Indication: cellulitis/?osteo  Labs:  Recent Labs  12/06/12 0615  WBC 9.8  HGB 10.5*  PLT 243  CREATININE 0.56   Estimated Creatinine Clearance: 119.9 ml/min (by C-G formula based on Cr of 0.56).  Recent Labs  12/06/12 1859 12/08/12 2125  VANCOTROUGH 7.0* 18.8     Microbiology: Recent Results (from the past 720 hour(s))  WOUND CULTURE     Status: None   Collection Time    12/03/12  4:01 PM      Result Value Range Status   Specimen Description WOUND HAND   Final   Special Requests NONE   Final   Gram Stain     Final   Value: NO WBC SEEN     RARE SQUAMOUS EPITHELIAL CELLS PRESENT     MODERATE GRAM POSITIVE RODS     IN PAIRS   Culture     Final   Value: MULTIPLE ORGANISMS PRESENT, NONE PREDOMINANT NO STAPHYLOCOCCUS AUREUS ISOLATED NO GROUP A STREP (S.PYOGENES) ISOLATED   Report Status 12/05/2012 FINAL   Final  SURGICAL PCR SCREEN     Status: Abnormal   Collection Time    12/03/12  9:53 PM      Result Value Range Status   MRSA, PCR NEGATIVE  NEGATIVE Final   Staphylococcus aureus POSITIVE (*) NEGATIVE Final   Comment: RESULT CALLED TO, READ BACK BY AND VERIFIED WITH:     TTHACKER(RN) BY TCLEVELAND 12/04/12 AT 1250  AFB CULTURE WITH SMEAR     Status: None   Collection Time    12/04/12 12:16 PM      Result Value Range Status   Specimen Description WOUND HAND LEFT   Final   Special Requests PT ON ZOSYN VANCOMYCIN   Final   ACID FAST SMEAR NO ACID FAST BACILLI SEEN   Final   Culture     Final   Value: CULTURE WILL BE EXAMINED FOR 6 WEEKS BEFORE ISSUING A FINAL REPORT   Report Status PENDING   Incomplete  WOUND CULTURE     Status: None   Collection Time    12/04/12 12:16 PM      Result Value Range Status   Specimen Description WOUND HAND LEFT   Final   Special Requests PT ON ZOSYN VANCOMYCIN   Final   Gram Stain     Final   Value: NO WBC SEEN     NO SQUAMOUS EPITHELIAL CELLS SEEN     RARE  GRAM POSITIVE COCCI     IN PAIRS   Culture FEW PROTEUS VULGARIS   Final   Report Status 12/08/2012 FINAL   Final   Organism ID, Bacteria PROTEUS VULGARIS   Final    Anti-infectives   Start     Dose/Rate Route Frequency Ordered Stop   12/08/12 1030  ceFEPIme (MAXIPIME) 2 g in dextrose 5 % 50 mL IVPB     2 g 100 mL/hr over 30 Minutes Intravenous 3 times per day 12/08/12 0928     12/07/12 0600  vancomycin (VANCOCIN) 1,500 mg in sodium chloride 0.9 % 500 mL IVPB     1,500 mg 250 mL/hr over 120 Minutes Intravenous Every 8 hours 12/06/12 2157     12/04/12 0800  vancomycin (VANCOCIN) IVPB 1000 mg/200 mL premix  Status:  Discontinued     1,000 mg 200 mL/hr over 60 Minutes Intravenous Every 12 hours 12/03/12 2004 12/06/12 2157   12/04/12 0400  piperacillin-tazobactam (ZOSYN)  IVPB 3.375 g  Status:  Discontinued     3.375 g 12.5 mL/hr over 240 Minutes Intravenous Every 8 hours 12/03/12 2004 12/08/12 0928   12/03/12 2015  vancomycin (VANCOCIN) IVPB 1000 mg/200 mL premix     1,000 mg 200 mL/hr over 60 Minutes Intravenous  Once 12/03/12 2002 12/03/12 2200   12/03/12 2015  piperacillin-tazobactam (ZOSYN) IVPB 3.375 g     3.375 g 100 mL/hr over 30 Minutes Intravenous  Once 12/03/12 2002 12/03/12 2058   12/03/12 1545  ceFAZolin (ANCEF) IVPB 1 g/50 mL premix     1 g 100 mL/hr over 30 Minutes Intravenous  Once 12/03/12 1535 12/03/12 1712      Assessment:Plan:  52yo male now therapeutic on vancomycin after dose change for complicated infection of hand, now s/p repeat I&D with digit removal.  Will continue vanc at current dose and continue to follow.  Vernard Gambles, PharmD, BCPS  12/08/2012,11:13 PM

## 2012-12-09 LAB — GLUCOSE, CAPILLARY
Glucose-Capillary: 137 mg/dL — ABNORMAL HIGH (ref 70–99)
Glucose-Capillary: 191 mg/dL — ABNORMAL HIGH (ref 70–99)
Glucose-Capillary: 202 mg/dL — ABNORMAL HIGH (ref 70–99)
Glucose-Capillary: 147 mg/dL — ABNORMAL HIGH (ref 70–99)

## 2012-12-09 LAB — CBC
Hemoglobin: 11.4 g/dL — ABNORMAL LOW (ref 13.0–17.0)
MCH: 28.4 pg (ref 26.0–34.0)
RBC: 4.02 MIL/uL — ABNORMAL LOW (ref 4.22–5.81)
WBC: 8.3 10*3/uL (ref 4.0–10.5)

## 2012-12-09 LAB — BASIC METABOLIC PANEL
GFR calc non Af Amer: >90 mL/min (ref >90)
Glucose, Bld: 153 mg/dL — ABNORMAL HIGH (ref 70–99)
Potassium: 3.9 mEq/L (ref 3.5–5.1)
Sodium: 140 mEq/L (ref 135–145)

## 2012-12-09 MED ORDER — LEVOFLOXACIN IN D5W 750 MG/150ML IV SOLN
750.0000 mg | INTRAVENOUS | Status: DC
  Administered 2012-12-09 – 2012-12-11 (×3): 750 mg via INTRAVENOUS
  Filled 2012-12-09 (×4): qty 150

## 2012-12-09 MED ORDER — DIPHENHYDRAMINE HCL 25 MG PO CAPS
50.0000 mg | ORAL_CAPSULE | Freq: Four times a day (QID) | ORAL | Status: DC | PRN
Start: 2012-12-09 — End: 2012-12-12
  Administered 2012-12-09: 50 mg via ORAL
  Filled 2012-12-09: qty 2

## 2012-12-09 MED ORDER — SODIUM CHLORIDE 0.9 % IV SOLN
3.0000 g | Freq: Four times a day (QID) | INTRAVENOUS | Status: DC
  Administered 2012-12-09: 3 g via INTRAVENOUS
  Filled 2012-12-09 (×3): qty 3

## 2012-12-09 NOTE — Progress Notes (Signed)
Family Medicine Teaching Service Daily Progress Note Service Pager: [phone]  Patient Assessment: Stephen Fitzgerald is a 52 y.o. year old male with a history of untreated diabetes presenting with frank infection of his left hand and fingers from imbedded rings.  Subjective: Continues to report good pain control with medicine. No complaints this AM. Surgery planning to see patient today.  Objective: Temp:  [97.8 F (36.6 C)-98.3 F (36.8 C)] 97.8 F (36.6 C) (05/26 0428) Pulse Rate:  [93-96] 96 (05/26 0428) Resp:  [16-18] 16 (05/26 0428) BP: (114-164)/(75-96) 161/96 mmHg (05/26 0428) SpO2:  [97 %-98 %] 98 % (05/26 0428) Exam: General: NAD, resting comfortably in bed Cardiovascular: RRR, 2/6 systolic murmur present Respiratory: NWOB, CTAB via anterior auscultation Abdomen: soft, nontender to palpation Extremities: calves nontender to palpation. L hand and forearm heavily wrapped, did not unwrap. Brisk capillary refill in L hand fingers. Somewhat diminished sensation to light touch on left middle finger. Neuro: grossly nonfocal, speech intact  I have reviewed the patient's medications, labs, imaging, and diagnostic testing.  Notable results are summarized below.  Medications:  Scheduled Meds: . aspirin  325 mg Oral Daily  . ceFEPime (MAXIPIME) IV  2 g Intravenous Q8H  . enoxaparin (LOVENOX) injection  40 mg Subcutaneous Q24H  . folic acid  1 mg Oral Daily  . glipiZIDE  5 mg Oral BID AC  . insulin aspart  0-15 Units Subcutaneous TID WC  . living well with diabetes book   Does not apply Once  . metoprolol tartrate  25 mg Oral BID  . multivitamin with minerals  1 tablet Oral Daily  . polyethylene glycol  17 g Oral BID  . simvastatin  40 mg Oral QPM  . thiamine  100 mg Oral Daily  . vancomycin  1,500 mg Intravenous Q8H   Continuous Infusions:   PRN Meds:.acetaminophen, acetaminophen, bisacodyl, hydrALAZINE, HYDROmorphone (DILAUDID) injection,  oxyCODONE-acetaminophen  Labs:  CBC  Recent Labs Lab 12/05/12 1020 12/06/12 0615 12/09/12 0525  WBC 13.4* 9.8 8.3  HGB 11.2* 10.5* 11.4*  HCT 33.3* 31.3* 34.6*  PLT 226 243 406*    BMET  Recent Labs Lab 12/05/12 1020 12/06/12 0615 12/09/12 0525  NA 132* 136 140  K 3.8 3.9 3.9  CL 97 103 104  CO2 22 23 29   BUN 7 7 6   CREATININE 0.65 0.56 0.52  GLUCOSE 304* 223* 153*  CALCIUM 8.2* 8.4 8.9    Lipid Panel     Component Value Date/Time   CHOL 131 12/04/2012 0545   TRIG 76 12/04/2012 0545   HDL 36* 12/04/2012 0545   CHOLHDL 3.6 12/04/2012 0545   VLDL 15 12/04/2012 0545   LDLCALC 80 12/04/2012 0545    CBG (last 3)   Recent Labs  12/08/12 1202 12/08/12 1721 12/08/12 2058  GLUCAP 162* 211* 216*  SSI in last 24: 10 units  Sed rate 70  Lactic acid 1.64  Hgb A1c 9.0  Wound cx 5/20 ED: multiple organisms present, none predominant (no staph aureus, no group A strep) Wound cx 5/21 OR: few proteus vulgaris sensitive to all except ampicillin & cefazolin AFB cx 5/21: no growth to date  HIV negative HCV negative  Imaging/Diagnostic Tests:  L Hand Xray:  No acute fracture or subluxation. Soft tissue swelling mid and distal aspect second third and fourth finger. Limited study by metallic rings.  Assessment/Plan:  Stephen Fitzgerald is a 43 y.o. year old male with a history of untreated diabetes presenting with frank infection of  his left hand and fingers from imbedded rings. Febrile to 100.7 and tachycardia present on admission so patient met sepsis criteria. Overall clinically well-appearing except for his localized infection.   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 Chronic and untreated. Needle aversion makes insulin not ideal for long term treatment. - A1c 9.0 - started glipizide 5mg  on 5/24, anticipate starting metformin closer to discharge - continue moderate SSI for additional coverage - carb modified diet  # Hyperlipidemia - pt is on statin at home (simva 40 daily) but this is given to him by his father, not prescribed for him by a doctor  - continue this dose for now  - lipids appear well controlled (LDL 80)  CARDIOVASCULAR:  # Elevated BP's:  - improved after initiating metoprolol 25mg  BID, continue this regimen - a few high BP's overnight (160s/90s). Will continue to monitor and if persists, consider adding second oral agent (lisinopril most likely, given coexisting diabetes) # Tachycardia: likely related to patients infection and recent surgery - resolved  NEURO:  # Hx of stroke:  - continue aspirin daily   PSYCH:  # Alcohol Use: reports drinking several beers per day  - had observed pt on CIWA, with scores 0-2. Unlikely to withdraw at this point. - UDS negative # Behavior:  - Pt exhibits some strange behaviors and does not seem to fully appreciate the severity of his infection. Has gone for months likely with frank infection of his hand, without seeking care. Does not seem to pick up on social cues and is very difficult to redirect, getting fixated on certain topics throughout conversation. This behavior along with the hx provided by his father of a neat and orderly apartment suggests possible autism spectrum disorder. Will treat patient's immediate medical needs now (hand infection and  diabetes) and consult psychiatry if need arises in the future.   FEN/GI:  - SLIV - carb modified  SOCIAL:  # Dispo:  - pending clinical improvement  - will require prolonged IV antibiotic treatment through a PICC line, so SNF placement may be necessary in light of pt's social situation  # Code Status:  -did not discuss, presume full code  Levert Feinstein, MD [facility] Medicine PGY-1 Service Pager [phone]

## 2012-12-09 NOTE — Progress Notes (Addendum)
INFECTIOUS DISEASE PROGRESS NOTE  ID: Stephen Fitzgerald is a 52 y.o. male with   Principal Problem:   Abscess of hand, left Active Problems:   Type II or unspecified type diabetes mellitus with unspecified complication, uncontrolled   Flexor tenosynovitis of finger   Expressive aphasia syndrome   Late effects of cerebrovascular accident  Subjective: Without complaints.   Abtx:  Anti-infectives   Start     Dose/Rate Route Frequency Ordered Stop   12/08/12 1030  ceFEPIme (MAXIPIME) 2 g in dextrose 5 % 50 mL IVPB     2 g 100 mL/hr over 30 Minutes Intravenous 3 times per day 12/08/12 0928     12/07/12 0600  vancomycin (VANCOCIN) 1,500 mg in sodium chloride 0.9 % 500 mL IVPB     1,500 mg 250 mL/hr over 120 Minutes Intravenous Every 8 hours 12/06/12 2157     12/04/12 0800  vancomycin (VANCOCIN) IVPB 1000 mg/200 mL premix  Status:  Discontinued     1,000 mg 200 mL/hr over 60 Minutes Intravenous Every 12 hours 12/03/12 2004 12/06/12 2157   12/04/12 0400  piperacillin-tazobactam (ZOSYN) IVPB 3.375 g  Status:  Discontinued     3.375 g 12.5 mL/hr over 240 Minutes Intravenous Every 8 hours 12/03/12 2004 12/08/12 0928   12/03/12 2015  vancomycin (VANCOCIN) IVPB 1000 mg/200 mL premix     1,000 mg 200 mL/hr over 60 Minutes Intravenous  Once 12/03/12 2002 12/03/12 2200   12/03/12 2015  piperacillin-tazobactam (ZOSYN) IVPB 3.375 g     3.375 g 100 mL/hr over 30 Minutes Intravenous  Once 12/03/12 2002 12/03/12 2058   12/03/12 1545  ceFAZolin (ANCEF) IVPB 1 g/50 mL premix     1 g 100 mL/hr over 30 Minutes Intravenous  Once 12/03/12 1535 12/03/12 1712      Medications:  Scheduled: . aspirin  325 mg Oral Daily  . ceFEPime (MAXIPIME) IV  2 g Intravenous Q8H  . enoxaparin (LOVENOX) injection  40 mg Subcutaneous Q24H  . folic acid  1 mg Oral Daily  . glipiZIDE  5 mg Oral BID AC  . insulin aspart  0-15 Units Subcutaneous TID WC  . living well with diabetes book   Does not apply Once  .  metoprolol tartrate  25 mg Oral BID  . multivitamin with minerals  1 tablet Oral Daily  . polyethylene glycol  17 g Oral BID  . simvastatin  40 mg Oral QPM  . thiamine  100 mg Oral Daily  . vancomycin  1,500 mg Intravenous Q8H    Objective: Vital signs in last 24 hours: Temp:  [97.8 F (36.6 C)-98.3 F (36.8 C)] 98.3 F (36.8 C) (05/26 1341) Pulse Rate:  [93-96] 94 (05/26 1341) Resp:  [16-18] 16 (05/26 1341) BP: (114-164)/(75-96) 119/77 mmHg (05/26 1341) SpO2:  [97 %-98 %] 97 % (05/26 1341)   General appearance: alert, cooperative and no distress Skin: L hand wrapped, no proxiaml erythema. normal light touch in fingers.  Lymph nodes: Axillary adenopathy: none on L   Lab Results  Recent Labs  12/09/12 0525  WBC 8.3  HGB 11.4*  HCT 34.6*  NA 140  K 3.9  CL 104  CO2 29  BUN 6  CREATININE 0.52   Liver Panel No results found for this basename: PROT, ALBUMIN, AST, ALT, ALKPHOS, BILITOT, BILIDIR, IBILI,  in the last 72 hours Sedimentation Rate No results found for this basename: ESRSEDRATE,  in the last 72 hours C-Reactive Protein No results found  for this basename: CRP,  in the last 72 hours  Microbiology: Recent Results (from the past 240 hour(s))  WOUND CULTURE     Status: None   Collection Time    12/03/12  4:01 PM      Result Value Range Status   Specimen Description WOUND HAND   Final   Special Requests NONE   Final   Gram Stain     Final   Value: NO WBC SEEN     RARE SQUAMOUS EPITHELIAL CELLS PRESENT     MODERATE GRAM POSITIVE RODS     IN PAIRS   Culture     Final   Value: MULTIPLE ORGANISMS PRESENT, NONE PREDOMINANT NO STAPHYLOCOCCUS AUREUS ISOLATED NO GROUP A STREP (S.PYOGENES) ISOLATED   Report Status 12/05/2012 FINAL   Final  SURGICAL PCR SCREEN     Status: Abnormal   Collection Time    12/03/12  9:53 PM      Result Value Range Status   MRSA, PCR NEGATIVE  NEGATIVE Final   Staphylococcus aureus POSITIVE (*) NEGATIVE Final   Comment: RESULT  CALLED TO, READ BACK BY AND VERIFIED WITH:     TTHACKER(RN) BY TCLEVELAND 12/04/12 AT 1250  AFB CULTURE WITH SMEAR     Status: None   Collection Time    12/04/12 12:16 PM      Result Value Range Status   Specimen Description WOUND HAND LEFT   Final   Special Requests PT ON ZOSYN VANCOMYCIN   Final   ACID FAST SMEAR NO ACID FAST BACILLI SEEN   Final   Culture     Final   Value: CULTURE WILL BE EXAMINED FOR 6 WEEKS BEFORE ISSUING A FINAL REPORT   Report Status PENDING   Incomplete  WOUND CULTURE     Status: None   Collection Time    12/04/12 12:16 PM      Result Value Range Status   Specimen Description WOUND HAND LEFT   Final   Special Requests PT ON ZOSYN VANCOMYCIN   Final   Gram Stain     Final   Value: NO WBC SEEN     NO SQUAMOUS EPITHELIAL CELLS SEEN     RARE GRAM POSITIVE COCCI     IN PAIRS   Culture FEW PROTEUS VULGARIS   Final   Report Status 12/08/2012 FINAL   Final   Organism ID, Bacteria PROTEUS VULGARIS   Final    Studies/Results: No results found.   Assessment/Plan: L hand abscess Proteus (R-amp)  Total days of antibiotics: 7 (vanco/ceftriaxone)  Will stop vanco and ceftriaxone.  Start unasyn Pt states he is for further I & D, repair in AM.  HIV and Hep C (-) Will continue to watch.....         [facility] [phone] [email or web address] 12/09/2012, 1:56 PM   LOS: 6 days

## 2012-12-09 NOTE — Progress Notes (Signed)
Subjective: POD 3 Repeat debridement, index finger fillet flap and coverage to ring finger wound POD 5 removal of rings and debridement of left hand Sitting up, fairly comfortable, talkative   Objective: Vital signs in last 24 hours: Temp:  [97.8 F (36.6 C)-98.3 F (36.8 C)] 98.3 F (36.8 C) (05/26 1341) Pulse Rate:  [94-96] 94 (05/26 1341) Resp:  [16-18] 16 (05/26 1341) BP: (119-164)/(77-96) 119/77 mmHg (05/26 1341) SpO2:  [97 %-98 %] 97 % (05/26 1341)  Intake/Output from previous day: 05/25 0701 - 05/26 0700 In: 1270 [P.O.:720; IV Piggyback:550] Out: 2402 [Urine:2400; Stool:2] Intake/Output this shift: Total I/O In: 480 [P.O.:480] Out: 400 [Urine:400]   Recent Labs  12/09/12 0525  HGB 11.4*    Recent Labs  12/09/12 0525  WBC 8.3  RBC 4.02*  HCT 34.6*  PLT 406*    Recent Labs  12/09/12 0525  NA 140  K 3.9  CL 104  CO2 29  BUN 6  CREATININE 0.52  GLUCOSE 153*  CALCIUM 8.9   No results found for this basename: LABPT, INR,  in the last 72 hours  Left hand in dressing, fingers all warm,pink Tip of flap visualized, warm and pink with good CR  Assessment/Plan: Spoke with him about operative findings from Friday Surgical Plan: May D/C once appropriate antibiotic plan decided and arranged. RTC to see me in 7-10 days Plan next stage of surgery to divide and inset flap for approx 2 weeks from today. Dressing can remain until he returns to my office.   Stephen Fitzgerald A. 12/09/2012, 3:47 PM

## 2012-12-09 NOTE — Progress Notes (Signed)
I discussed with  Dr Pollie Meyer.  I agree with their plans documented in their progress note for today. Proteus vulgaris growing from hand wound culture. Curently on Vanc and Zosyn.  Will await ID service recommendations about narrowing antibiotic coverage.

## 2012-12-10 DIAGNOSIS — L02519 Cutaneous abscess of unspecified hand: Secondary | ICD-10-CM | POA: Diagnosis not present

## 2012-12-10 DIAGNOSIS — L03019 Cellulitis of unspecified finger: Secondary | ICD-10-CM | POA: Diagnosis not present

## 2012-12-10 DIAGNOSIS — B964 Proteus (mirabilis) (morganii) as the cause of diseases classified elsewhere: Secondary | ICD-10-CM | POA: Diagnosis not present

## 2012-12-10 DIAGNOSIS — E119 Type 2 diabetes mellitus without complications: Secondary | ICD-10-CM | POA: Diagnosis not present

## 2012-12-10 LAB — GLUCOSE, CAPILLARY: Glucose-Capillary: 224 mg/dL — ABNORMAL HIGH (ref 70–99)

## 2012-12-10 NOTE — Progress Notes (Signed)
Seen and examined.  Discussed in rounds.  He seems to be recovering nicely.  We need to begin dispo planning.  I do not think that it would be wise to send him home (lives by himself.)  He strikes me as having some cognitive issues.  He told me I should talk to his dad.  (Does his dad have power of attorney?)  I think he would do best in short term SNF to complete antibiotic course and to have good wound care during surgical recovery.

## 2012-12-10 NOTE — Progress Notes (Signed)
Family Medicine Teaching Service Daily Progress Note Service Pager: [phone]  Patient Assessment: Stephen Fitzgerald is a 52 y.o. year old male with a history of untreated diabetes presenting with frank infection of his left hand and fingers from imbedded rings.  Subjective: Pain remains well controlled. Yesterday PM he had an acute pruritic rash so his Unasyn was changed to levaquin. The itching improved after getting benadryl. The rash remains this morning. He denies significant shortness of breath or lesions in his mouth.  Objective: Temp:  [98 F (36.7 C)-98.6 F (37 C)] 98 F (36.7 C) (05/27 0530) Pulse Rate:  [93-101] 93 (05/27 0530) Resp:  [16-20] 18 (05/27 0530) BP: (119-135)/(77-85) 135/85 mmHg (05/27 0530) SpO2:  [97 %-100 %] 97 % (05/27 0530) Exam: General: NAD, resting comfortably in bed HEENT: no oral lesions visible Cardiovascular: RRR Respiratory: NWOB, some coarse breath sounds/rhonchi but no appreciable wheezing Extremities: L hand and forearm heavily wrapped, did not unwrap.  Neuro: grossly nonfocal, speech intact Derm: diffuse maculopapular rash covering left arm, back, and above gluteal cleft. The rash blanches. No skin breakdown. The rash spares the legs and anterior torso.  I have reviewed the patient's medications, labs, imaging, and diagnostic testing.  Notable results are summarized below.  Medications:  Scheduled Meds: . aspirin  325 mg Oral Daily  . enoxaparin (LOVENOX) injection  40 mg Subcutaneous Q24H  . folic acid  1 mg Oral Daily  . glipiZIDE  5 mg Oral BID AC  . insulin aspart  0-15 Units Subcutaneous TID WC  . levofloxacin (LEVAQUIN) IV  750 mg Intravenous Q24H  . living well with diabetes book   Does not apply Once  . metoprolol tartrate  25 mg Oral BID  . multivitamin with minerals  1 tablet Oral Daily  . polyethylene glycol  17 g Oral BID  . simvastatin  40 mg Oral QPM  . thiamine  100 mg Oral Daily   Continuous Infusions:   PRN  Meds:.acetaminophen, acetaminophen, bisacodyl, diphenhydrAMINE, hydrALAZINE, HYDROmorphone (DILAUDID) injection, oxyCODONE-acetaminophen  Labs:  CBC  Recent Labs Lab 12/05/12 1020 12/06/12 0615 12/09/12 0525  WBC 13.4* 9.8 8.3  HGB 11.2* 10.5* 11.4*  HCT 33.3* 31.3* 34.6*  PLT 226 243 406*    BMET  Recent Labs Lab 12/05/12 1020 12/06/12 0615 12/09/12 0525  NA 132* 136 140  K 3.8 3.9 3.9  CL 97 103 104  CO2 22 23 29   BUN 7 7 6   CREATININE 0.65 0.56 0.52  GLUCOSE 304* 223* 153*  CALCIUM 8.2* 8.4 8.9    Lipid Panel     Component Value Date/Time   CHOL 131 12/04/2012 0545   TRIG 76 12/04/2012 0545   HDL 36* 12/04/2012 0545   CHOLHDL 3.6 12/04/2012 0545   VLDL 15 12/04/2012 0545   LDLCALC 80 12/04/2012 0545    CBG (last 3)   Recent Labs  12/09/12 1200 12/09/12 1646 12/09/12 2138  GLUCAP 191* 202* 147*  SSI in last 24: 10 units  Sed rate 70  Lactic acid 1.64  Hgb A1c 9.0  Wound cx 5/20 ED: multiple organisms present, none predominant (no staph aureus, no group A strep) Wound cx 5/21 OR: few proteus vulgaris sensitive to all except ampicillin & cefazolin AFB cx 5/21: no growth to date  HIV negative HCV negative  Imaging/Diagnostic Tests:  L Hand Xray:  No acute fracture or subluxation. Soft tissue swelling mid and distal aspect second third and fourth finger. Limited study by metallic rings.  Assessment/Plan:  Stephen Fitzgerald is a 55 y.o. year old male with a history of untreated diabetes presenting with frank infection of his left hand and fingers from imbedded rings. Febrile to 100.7 and tachycardia present on admission so patient met sepsis criteria. Overall clinically well-appearing except for his localized infection.   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 Will await ID input on what appropriate regimen is for after discharge. If still needs IV abx, will order PICC placed - tylenol PRN fever, dilaudid/oxy prn pain - HIV & Hep C negative - continue benadryl prn for rash  ENDO:  # Diabetes: glucose >500 on BMET on admission. Chronic and untreated. Needle aversion makes insulin not ideal for long term treatment. - A1c 9.0 - started glipizide 5mg  on 5/24, anticipate starting metformin closer to discharge - continue moderate SSI for additional coverage - carb modified diet  # Hyperlipidemia - pt is on statin at home (simva 40 daily) but this is given to him by his father, not prescribed for him by a doctor  - continue this dose for now  - lipids appear well controlled (LDL 80)  CARDIOVASCULAR:  # Elevated BP's:  - improved after initiating metoprolol 25mg  BID, continue this regimen # Tachycardia: likely related to patients infection and recent surgery - resolved  NEURO:  # Hx of stroke:  - continue aspirin daily   PSYCH:  # Alcohol Use: reports drinking several beers per day  - had observed pt on CIWA, with scores 0-2. Unlikely to withdraw at this point. - UDS negative # Behavior:  - Pt exhibits some strange behaviors and does not seem to fully appreciate the severity of his infection. Has gone for months likely with frank infection of his hand, without seeking care. Does not seem to pick up on social cues and is very difficult to redirect, getting fixated on certain topics throughout conversation. This behavior along with the hx provided by his father of a neat and orderly apartment suggests possible autism spectrum disorder. Will treat patient's immediate medical needs now (hand infection  and diabetes) and consult psychiatry if need arises in the future.   FEN/GI:  - SLIV - carb modified  SOCIAL:  # Dispo:  - pending clinical improvement  - will speak with patient today about whether he would be willing to go to a SNF after d/c for administration of IV antibiotics - likely not a good candidate for d/c home in light of social situation # Code Status:  -did not discuss, presume full code  Levert Feinstein, MD [facility] Medicine PGY-1 Service Pager [phone]

## 2012-12-10 NOTE — Progress Notes (Signed)
Patient began itching so skin reexamined, and he had a rash spreading from buttocks to upper back to LUE.  MD notified.  Benadryl and new/current antibiotic discontinued while levaquin was ordered.  Will continue to monitor patient.

## 2012-12-10 NOTE — Anesthesia Postprocedure Evaluation (Signed)
Anesthesia Post Note  Patient: Stephen Fitzgerald  Procedure(s) Performed: Procedure(s) (LRB): AMPUTATION DIGIT LEFT INDEX (Left) IRRIGATION AND DEBRIDEMENT EXTREMITY LEFT HAND (Left)  Anesthesia type: general  Patient location: PACU  Post pain: Pain level controlled  Post assessment: Patient's Cardiovascular Status Stable  Post vital signs: Reviewed and stable  Level of consciousness: sedated  Complications: No apparent anesthesia complications

## 2012-12-10 NOTE — Progress Notes (Signed)
INFECTIOUS DISEASE PROGRESS NOTE  ID: ELIEZER KHAWAJA is a 52 y.o. male with  Principal Problem:   Abscess of hand, left Active Problems:   Type II or unspecified type diabetes mellitus with unspecified complication, uncontrolled   Flexor tenosynovitis of finger   Expressive aphasia syndrome   Late effects of cerebrovascular accident  Subjective: Without complaints. No SOB, no cough, no dysphagia.   Abtx:  Anti-infectives   Start     Dose/Rate Route Frequency Ordered Stop   12/09/12 2300  levofloxacin (LEVAQUIN) IVPB 750 mg     750 mg 100 mL/hr over 90 Minutes Intravenous Every 24 hours 12/09/12 2154     12/09/12 1600  Ampicillin-Sulbactam (UNASYN) 3 g in sodium chloride 0.9 % 100 mL IVPB  Status:  Discontinued     3 g 100 mL/hr over 60 Minutes Intravenous Every 6 hours 12/09/12 1405 12/09/12 2154   12/08/12 1030  ceFEPIme (MAXIPIME) 2 g in dextrose 5 % 50 mL IVPB  Status:  Discontinued     2 g 100 mL/hr over 30 Minutes Intravenous 3 times per day 12/08/12 0928 12/09/12 1405   12/07/12 0600  vancomycin (VANCOCIN) 1,500 mg in sodium chloride 0.9 % 500 mL IVPB  Status:  Discontinued     1,500 mg 250 mL/hr over 120 Minutes Intravenous Every 8 hours 12/06/12 2157 12/09/12 1405   12/04/12 0800  vancomycin (VANCOCIN) IVPB 1000 mg/200 mL premix  Status:  Discontinued     1,000 mg 200 mL/hr over 60 Minutes Intravenous Every 12 hours 12/03/12 2004 12/06/12 2157   12/04/12 0400  piperacillin-tazobactam (ZOSYN) IVPB 3.375 g  Status:  Discontinued     3.375 g 12.5 mL/hr over 240 Minutes Intravenous Every 8 hours 12/03/12 2004 12/08/12 0928   12/03/12 2015  vancomycin (VANCOCIN) IVPB 1000 mg/200 mL premix     1,000 mg 200 mL/hr over 60 Minutes Intravenous  Once 12/03/12 2002 12/03/12 2200   12/03/12 2015  piperacillin-tazobactam (ZOSYN) IVPB 3.375 g     3.375 g 100 mL/hr over 30 Minutes Intravenous  Once 12/03/12 2002 12/03/12 2058   12/03/12 1545  ceFAZolin (ANCEF) IVPB 1 g/50 mL  premix     1 g 100 mL/hr over 30 Minutes Intravenous  Once 12/03/12 1535 12/03/12 1712      Medications:  Scheduled: . aspirin  325 mg Oral Daily  . enoxaparin (LOVENOX) injection  40 mg Subcutaneous Q24H  . folic acid  1 mg Oral Daily  . glipiZIDE  5 mg Oral BID AC  . insulin aspart  0-15 Units Subcutaneous TID WC  . levofloxacin (LEVAQUIN) IV  750 mg Intravenous Q24H  . living well with diabetes book   Does not apply Once  . metoprolol tartrate  25 mg Oral BID  . multivitamin with minerals  1 tablet Oral Daily  . polyethylene glycol  17 g Oral BID  . simvastatin  40 mg Oral QPM  . thiamine  100 mg Oral Daily    Objective: Vital signs in last 24 hours: Temp:  [98 F (36.7 C)-98.6 F (37 C)] 98 F (36.7 C) (05/27 0530) Pulse Rate:  [93-101] 93 (05/27 0530) Resp:  [16-20] 18 (05/27 0530) BP: (119-135)/(77-85) 135/85 mmHg (05/27 0530) SpO2:  [97 %-100 %] 97 % (05/27 0530)   General appearance: alert, cooperative and no distress Extremities: LUE wrapped.  Skin: macular - arms, raised with mild erythema.   Lab Results  Recent Labs  12/09/12 0525  WBC 8.3  HGB  11.4*  HCT 34.6*  NA 140  K 3.9  CL 104  CO2 29  BUN 6  CREATININE 0.52   Liver Panel No results found for this basename: PROT, ALBUMIN, AST, ALT, ALKPHOS, BILITOT, BILIDIR, IBILI,  in the last 72 hours Sedimentation Rate No results found for this basename: ESRSEDRATE,  in the last 72 hours C-Reactive Protein No results found for this basename: CRP,  in the last 72 hours  Microbiology: Recent Results (from the past 240 hour(s))  WOUND CULTURE     Status: None   Collection Time    12/03/12  4:01 PM      Result Value Range Status   Specimen Description WOUND HAND   Final   Special Requests NONE   Final   Gram Stain     Final   Value: NO WBC SEEN     RARE SQUAMOUS EPITHELIAL CELLS PRESENT     MODERATE GRAM POSITIVE RODS     IN PAIRS   Culture     Final   Value: MULTIPLE ORGANISMS PRESENT, NONE  PREDOMINANT NO STAPHYLOCOCCUS AUREUS ISOLATED NO GROUP A STREP (S.PYOGENES) ISOLATED   Report Status 12/05/2012 FINAL   Final  SURGICAL PCR SCREEN     Status: Abnormal   Collection Time    12/03/12  9:53 PM      Result Value Range Status   MRSA, PCR NEGATIVE  NEGATIVE Final   Staphylococcus aureus POSITIVE (*) NEGATIVE Final   Comment: RESULT CALLED TO, READ BACK BY AND VERIFIED WITH:     TTHACKER(RN) BY TCLEVELAND 12/04/12 AT 1250  AFB CULTURE WITH SMEAR     Status: None   Collection Time    12/04/12 12:16 PM      Result Value Range Status   Specimen Description WOUND HAND LEFT   Final   Special Requests PT ON ZOSYN VANCOMYCIN   Final   ACID FAST SMEAR NO ACID FAST BACILLI SEEN   Final   Culture     Final   Value: CULTURE WILL BE EXAMINED FOR 6 WEEKS BEFORE ISSUING A FINAL REPORT   Report Status PENDING   Incomplete  WOUND CULTURE     Status: None   Collection Time    12/04/12 12:16 PM      Result Value Range Status   Specimen Description WOUND HAND LEFT   Final   Special Requests PT ON ZOSYN VANCOMYCIN   Final   Gram Stain     Final   Value: NO WBC SEEN     NO SQUAMOUS EPITHELIAL CELLS SEEN     RARE GRAM POSITIVE COCCI     IN PAIRS   Culture FEW PROTEUS VULGARIS   Final   Report Status 12/08/2012 FINAL   Final   Organism ID, Bacteria PROTEUS VULGARIS   Final    Studies/Results: No results found.   Assessment/Plan: L hand abscess  Proteus (R-amp)  Adverse drug reaction  Total days of antibiotics: 8 (vanco/ceftriaxone --> unasyn --> levaquin)  Would give him 2 more weeks of levaquin, ok to change him to PO at d/c.  HIV and Hep C (-)  Will set him up to be seen in ID clinic after seen by surgery (~ 2 weeks).   Tarell Schollmeyer Infectious Diseases [phone] [email or web address] 12/10/2012, 1:30 PM   LOS: 7 days

## 2012-12-10 NOTE — Evaluation (Signed)
Physical Therapy Evaluation Patient Details Name: Stephen Fitzgerald MRN: [id number] DOB: [birth year] Today's Date: 12/10/2012 Time: 1446-1500 PT Time Calculation (min): 14 min  PT Assessment / Plan / Recommendation Clinical Impression  Pt adm with lt hand infection and underwent multiple surgical procedures.  Needs skilled PT to maximize I and safety.  Feel pt will be back to baseline mobilty within a few days.  Doubt he will need any further PT after dc.    PT Assessment  Patient needs continued PT services    Follow Up Recommendations  No PT follow up    Does the patient have the potential to tolerate intense rehabilitation      Barriers to Discharge        Equipment Recommendations  None recommended by PT    Recommendations for Other Services     Frequency Min 3X/week    Precautions / Restrictions     Pertinent Vitals/Pain See flow sheet.      Mobility  Bed Mobility Bed Mobility: Supine to Sit Supine to Sit: 6: Modified independent (Device/Increase time);HOB elevated Transfers Transfers: Sit to Stand Sit to Stand: 6: Modified independent (Device/Increase time);With upper extremity assist;From bed Ambulation/Gait Ambulation/Gait Assistance: 5: Supervision Ambulation Distance (Feet): 300 Feet Assistive device: None Ambulation/Gait Assistance Details: slightly unsteady but no loss of balance Gait Pattern: Step-through pattern;Decreased stride length;Narrow base of support    Exercises     PT Diagnosis: Difficulty walking  PT Problem List: Decreased balance PT Treatment Interventions: Gait training;Balance training   PT Goals Acute Rehab PT Goals PT Goal Formulation: With patient Time For Goal Achievement: 12/17/12 Potential to Achieve Goals: Good Pt will Ambulate: >150 feet;with modified independence PT Goal: Ambulate - Progress: Goal set today Additional Goals Additional Goal #1: Will score >19 on DGI.  Visit Information  Last PT Received On:  12/10/12 Assistance Needed: +1    Subjective Data  Subjective: "I like to walk," pt stated.   Prior Functioning  Home Living Lives With: Alone Available Help at Discharge: Family Type of Home: Apartment Home Access: Stairs to enter Secretary/administrator of Steps: 2-3 Home Layout: One level Prior Function Level of Independence: Independent Driving: No Vocation: On disability    Cognition  Cognition Arousal/Alertness: Awake/alert Behavior During Therapy: WFL for tasks assessed/performed Overall Cognitive Status: History of cognitive impairments - at baseline    Extremity/Trunk Assessment Right Lower Extremity Assessment RLE ROM/Strength/Tone: WFL for tasks assessed Left Lower Extremity Assessment LLE ROM/Strength/Tone: WFL for tasks assessed   Balance Balance Balance Assessed: Yes Static Standing Balance Static Standing - Balance Support: No upper extremity supported;During functional activity Static Standing - Level of Assistance: 7: Independent  End of Session PT - End of Session Activity Tolerance: Patient tolerated treatment well Patient left: Other (comment) (in bathroom)  GP     Stephen Fitzgerald 12/10/2012, 3:41 PM  [facility] PT [phone]

## 2012-12-11 ENCOUNTER — Encounter (HOSPITAL_COMMUNITY): Payer: Self-pay | Admitting: Orthopedic Surgery

## 2012-12-11 DIAGNOSIS — E119 Type 2 diabetes mellitus without complications: Secondary | ICD-10-CM | POA: Diagnosis not present

## 2012-12-11 DIAGNOSIS — L03019 Cellulitis of unspecified finger: Secondary | ICD-10-CM | POA: Diagnosis not present

## 2012-12-11 DIAGNOSIS — L03119 Cellulitis of unspecified part of limb: Secondary | ICD-10-CM | POA: Diagnosis not present

## 2012-12-11 LAB — GLUCOSE, CAPILLARY: Glucose-Capillary: 132 mg/dL — ABNORMAL HIGH (ref 70–99)

## 2012-12-11 LAB — CBC
Hemoglobin: 11.4 g/dL — ABNORMAL LOW (ref 13.0–17.0)
RBC: 4.03 MIL/uL — ABNORMAL LOW (ref 4.22–5.81)
WBC: 8.1 10*3/uL (ref 4.0–10.5)

## 2012-12-11 LAB — BASIC METABOLIC PANEL
CO2: 29 mEq/L (ref 19–32)
Glucose, Bld: 164 mg/dL — ABNORMAL HIGH (ref 70–99)
Potassium: 4 mEq/L (ref 3.5–5.1)
Sodium: 142 mEq/L (ref 135–145)

## 2012-12-11 MED ORDER — SIMVASTATIN 40 MG PO TABS: 40.0000 mg | ORAL_TABLET | Freq: Every evening | ORAL | Status: DC

## 2012-12-11 MED ORDER — LEVOFLOXACIN 750 MG PO TABS: 750.0000 mg | ORAL_TABLET | Freq: Every day | ORAL | Status: DC

## 2012-12-11 MED ORDER — POLYETHYLENE GLYCOL 3350 17 G PO PACK: 17.0000 g | PACK | Freq: Two times a day (BID) | ORAL | Status: DC | PRN

## 2012-12-11 MED ORDER — GLIPIZIDE 5 MG PO TABS: 5.0000 mg | ORAL_TABLET | Freq: Two times a day (BID) | ORAL | Status: DC

## 2012-12-11 MED ORDER — DIPHENHYDRAMINE HCL 50 MG PO CAPS: 50.0000 mg | ORAL_CAPSULE | Freq: Four times a day (QID) | ORAL | Status: DC | PRN

## 2012-12-11 MED ORDER — OXYCODONE-ACETAMINOPHEN 5-325 MG PO TABS: 1.0000 | ORAL_TABLET | ORAL | Status: DC | PRN

## 2012-12-11 MED ORDER — METOPROLOL TARTRATE 25 MG PO TABS: 25.0000 mg | ORAL_TABLET | Freq: Two times a day (BID) | ORAL | Status: DC

## 2012-12-11 NOTE — Discharge Summary (Signed)
Family Medicine Teaching [facility] Discharge Summary  Patient name: Stephen Fitzgerald Medical record number: [id number] Date of birth: 01-09-1961 Age: 52 y.o. Gender: male Date of Admission: 12/03/2012  Date of Discharge: 12/12/12 Admitting Physician: Zachery Dauer, MD  Primary Care Provider: None  Indication for Hospitalization: sepsis secondary to imbedded rings in left hand  Discharge Diagnoses:  1. Sepsis secondary to hand infection from imbedded rings 2. Diabetes mellitus, not controlled 3. Hypertension 4. Tachycardia 5. Hyperlipidemia 6. History of stroke  Brief Hospital Course:  Stephen Fitzgerald is a 52 y.o. 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  # ID: - met sepsis criteria upon admission with fever, tachycardia, and obvious source of infection - started empirically on vancomycin and zosyn - hand surgeon (Dr. Janee Morn) took pt to OR on 5/21 for ring removal and debridement - returned again to OR on 5/23 for amputation of index finger - blood cultures were not obtained as patient had already received antibiotics in ER - infectious disease team was consulted - abx were transitioned to IV unasyn, but patient had a drug eruption after getting unasyn, so was instead switched to levaquin. He was given benadryl as needed for the rash - ID team felt another 2 weeks of levaquin orally will be sufficient upon discharge - HIV and hep C were checked and were negative. - Pt will f/u with ID clinic and hand surgeon (see appointment times below).  # ENDOCRINE: - Pt not on any diabetes medicines prior to admission, but a1c was checked and was 9.0. He was started on glipizide 5mg  BID and given moderate SSI for additional coverage. Will need outpatient followup for diabetes. Of note he is resistant to starting any injectable medications upon discharge and will likely not be compliant with them due to needle  aversion. - Pt had been taking simvastatin 40mg  daily prior to admission (was given to him by his father, but not prescribed to him). This medication was continued. Lipids were checked and appeared relatively well controlled (LDL 80).  # CARDIOVASCULAR: - pt hypertensive and tachycardic, but improved after initiating metoprolol 25mg  BID. Will continue upon discharge.  # NEURO:  - pt has hx of stroke. Continued his aspirin daily  # PSYCH: - pt had reported drinking several beers per day. Was monitored on CIWA protocol but did not ever appear to clinically withdraw. UDS was checked and was negative. - Pt exhibits some strange behaviors and does not seem to fully appreciate the severity of his infection. Has gone for months likely with frank infection of his hand, without seeking care. Does not seem to pick up on social cues and is very difficult to redirect, getting fixated on certain topics throughout conversation. This behavior along with the hx provided by his father of a neat and orderly apartment suggests possible autism spectrum disorder. Did not require psychiatric consultation during hospitalization   Consultations: hand surgery, infectious disease  Procedures:  -5/21: Removal of jewelry from left index, long, and ring fingers, with debridement of open wounds of the index and ring finger to include skin, subcutaneous tissue, and tendon. Drainage of radial and ulna bursa in the hand, with radical flexor tenosynovectomy of all the flexor tendons through the carpal canal -5/23:Excisional debridement of left hand to include skin, subcutaneous tissue and tendon. Ray amputation of the index ray with formation of a fillet flap of the skin, with insetting of the flap to  the ring finger wound   Significant Labs and Imaging:  CBC  Lab  12/06/12 0615  12/09/12 0525  12/11/12 0529   WBC  9.8  8.3  8.1   HGB  10.5*  11.4*  11.4*   HCT  31.3*  34.6*  34.9*   PLT  243  406*  482*    BMET  Lab   12/06/12 0615  12/09/12 0525  12/11/12 0529   NA  136  140  142   K  3.9  3.9  4.0   CL  103  104  103   CO2  23  29  29    BUN  7  6  8    CREATININE  0.56  0.52  0.66   GLUCOSE  223*  153*  164*   CALCIUM  8.4  8.9  9.0    Lipid Panel    Component  Value  Date/Time    CHOL  131  12/04/2012 0545    TRIG  76  12/04/2012 0545    HDL  36*  12/04/2012 0545    CHOLHDL  3.6  12/04/2012 0545    VLDL  15  12/04/2012 0545    LDLCALC  80  12/04/2012 0545    CBG (last 3)   Recent Labs   12/11/12 1741  12/11/12 2121  12/12/12 0802   GLUCAP  132*  127*  145*    Sed rate 70  Lactic acid 1.64  Hgb A1c 9.0   Wound cx 5/20 ED: multiple organisms present, none predominant (no staph aureus, no group A strep)  Wound cx 5/21 OR: few proteus vulgaris sensitive to all except ampicillin & cefazolin  AFB cx 5/21: no growth to date   HIV negative  HCV negative  L Hand Xray:  No acute fracture or subluxation. Soft tissue swelling mid and distal aspect second third and fourth finger. Limited study by metallic rings.  Discharge Medications:    Medication List    TAKE these medications       aspirin 325 MG tablet  Take 325 mg by mouth daily.     diphenhydrAMINE 50 MG capsule  Commonly known as:  BENADRYL  Take 1 capsule (50 mg total) by mouth every 6 (six) hours as needed for itching or allergies.     glipiZIDE 5 MG tablet  Commonly known as:  GLUCOTROL  Take 1 tablet (5 mg total) by mouth 2 (two) times daily before a meal.     levofloxacin 750 MG tablet  Commonly known as:  LEVAQUIN  Take 1 tablet (750 mg total) by mouth daily. For two weeks     metoprolol tartrate 25 MG tablet  Commonly known as:  LOPRESSOR  Take 1 tablet (25 mg total) by mouth 2 (two) times daily.     oxyCODONE-acetaminophen 5-325 MG per tablet  Commonly known as:  PERCOCET/ROXICET  Take 1 tablet by mouth every 4 (four) hours as needed for pain.     polyethylene glycol packet  Commonly known as:  MIRALAX /  GLYCOLAX  Take 17 g by mouth 2 (two) times daily as needed (constipation).     simvastatin 40 MG tablet  Commonly known as:  ZOCOR  Take 1 tablet (40 mg total) by mouth every evening.     vitamin B-12 100 MCG tablet  Commonly known as:  CYANOCOBALAMIN  Take 50 mcg by mouth daily.        Issues for Follow Up:  - f/u  on rash (ensure not worsening) - complete 2 week course of levaquin - needs outpatient f/u for untreated chronic medical problems (diabetes, hypertension, hyperlipidemia) - needs to establish care with PCP - has appointment at [facility] (see below) - monitor for worsening of infection - hand should remain wrapped until follows up with hand surgery  Outstanding Results: none      Follow-up Information   Follow up with [name]., MD On 12/18/2012. (at 1:00pm Engineer, site))    Contact information:   [address] [place] [address] [phone]       Follow up with Johny Sax, MD On 12/25/2012. (at 9:00am (Infectious Disease Doctor))    Contact information:   [address]  [address] [place] [address] [phone]       Follow up with Levert Feinstein, MD On 01/07/2013. (at 2:00pm [facility] Medicine Doctor))    Contact information:   [address] [place] [address] [phone]       Discharge Condition: stable  Discharge Disposition: to [facility]  Discharge Instructions: Please refer to Patient Instructions section of EMR for full details.  Patient was counseled important signs and symptoms that should prompt return to medical care, changes in medications, dietary instructions, activity restrictions, and follow up appointments.    Levert Feinstein, MD Family Medicine PGY-1

## 2012-12-11 NOTE — Progress Notes (Signed)
Physical Therapy Treatment Patient Details Name: Stephen Fitzgerald MRN: [id number] DOB: 09/02/1960 Today's Date: 12/11/2012 Time: 3086-5784 PT Time Calculation (min): 8 min  PT Assessment / Plan / Recommendation Comments on Treatment Session  Pt did well with increasing ambulation distance. Encouraged pt to keep left hand elevated to help with control of edema in hand    Follow Up Recommendations  No PT follow up     Does the patient have the potential to tolerate intense rehabilitation     Barriers to Discharge        Equipment Recommendations  None recommended by PT    Recommendations for Other Services    Frequency     Plan Discharge plan remains appropriate    Precautions / Restrictions Restrictions Weight Bearing Restrictions: Yes LUE Weight Bearing: Non weight bearing Other Position/Activity Restrictions: through hand   Pertinent Vitals/Pain No c/o pain    Mobility  Bed Mobility Bed Mobility: Supine to Sit Supine to Sit: 6: Modified independent (Device/Increase time);HOB elevated Transfers Transfers: Sit to Stand Sit to Stand: 6: Modified independent (Device/Increase time);With upper extremity assist;From bed Ambulation/Gait Ambulation/Gait Assistance: 5: Supervision Ambulation Distance (Feet): 450 Feet Assistive device: None Ambulation/Gait Assistance Details: pt continues to be slightly unsteady, but does not want to even try a straight cane Gait Pattern: Step-through pattern;Decreased stride length;Narrow base of support (decreased arm swing. Kept left arm elevated) Gait velocity: WFL General Gait Details: pt appears to have some dysfunction with right UE and LE but he is functional without balance loss.  He also usually walks with a boot with a heel and may feel some stretching from walking barefoot     Exercises     PT Diagnosis:    PT Problem List:   PT Treatment Interventions:     PT Goals Acute Rehab PT Goals PT Goal: Ambulate - Progress:  Progressing toward goal  Visit Information  Last PT Received On: 12/11/12 Assistance Needed: +1    Subjective Data  Subjective: pt reports he feels stretching at the back of his legs because he hasnt' walked much in past few days Patient Stated Goal: to walk   Cognition  Cognition Arousal/Alertness: Awake/alert Behavior During Therapy: WFL for tasks assessed/performed Overall Cognitive Status: History of cognitive impairments - at baseline    Balance  Balance Balance Assessed: Yes Static Standing Balance Static Standing - Balance Support: No upper extremity supported;During functional activity Static Standing - Level of Assistance: 7: Independent High Level Balance High Level Balance Activites: Backward walking;Direction changes;Sudden stops  End of Session PT - End of Session Activity Tolerance: Patient tolerated treatment well Patient left:  (with OT in room)   GP     Donnetta Hail 12/11/2012, 11:53 AM

## 2012-12-11 NOTE — Progress Notes (Signed)
Family Medicine Teaching Service Daily Progress Note Service Pager: [phone]  Patient Assessment: Stephen Fitzgerald is a 52 y.o. year old male with a history of untreated diabetes presenting with frank infection of his left hand and fingers from imbedded rings.  Subjective: Pain remains well controlled. Rash remains but pt has not gotten benadryl in last 24 hours. Pt expresses concern about going home and requests that we speak with his father about dispo planning.  Objective: Temp:  [98.3 F (36.8 C)-98.9 F (37.2 C)] 98.3 F (36.8 C) (05/28 0605) Pulse Rate:  [83-96] 83 (05/28 0605) Resp:  [18-20] 18 (05/28 0605) BP: (118-135)/(73-80) 135/78 mmHg (05/28 0605) SpO2:  [98 %-99 %] 98 % (05/28 4540) Exam: General: NAD, resting comfortably in bed Cardiovascular: RRR Respiratory: NWOB, no wheezing noted, lungs clear via anterior auscultation Extremities: L hand and forearm heavily wrapped, did not unwrap.  Neuro: grossly nonfocal, speech intact Derm: diffuse maculopapular rash covering left arm and back, improved from yesterday but still present.  I have reviewed the patient's medications, labs, imaging, and diagnostic testing.  Notable results are summarized below.  Medications:  Scheduled Meds: . aspirin  325 mg Oral Daily  . enoxaparin (LOVENOX) injection  40 mg Subcutaneous Q24H  . folic acid  1 mg Oral Daily  . glipiZIDE  5 mg Oral BID AC  . insulin aspart  0-15 Units Subcutaneous TID WC  . levofloxacin (LEVAQUIN) IV  750 mg Intravenous Q24H  . living well with diabetes book   Does not apply Once  . metoprolol tartrate  25 mg Oral BID  . multivitamin with minerals  1 tablet Oral Daily  . polyethylene glycol  17 g Oral BID  . simvastatin  40 mg Oral QPM  . thiamine  100 mg Oral Daily   Continuous Infusions:   PRN Meds:.acetaminophen, acetaminophen, bisacodyl, diphenhydrAMINE, hydrALAZINE, HYDROmorphone (DILAUDID) injection,  oxyCODONE-acetaminophen  Labs:  CBC  Recent Labs Lab 12/06/12 0615 12/09/12 0525 12/11/12 0529  WBC 9.8 8.3 8.1  HGB 10.5* 11.4* 11.4*  HCT 31.3* 34.6* 34.9*  PLT 243 406* 482*    BMET  Recent Labs Lab 12/06/12 0615 12/09/12 0525 12/11/12 0529  NA 136 140 142  K 3.9 3.9 4.0  CL 103 104 103  CO2 23 29 29   BUN 7 6 8   CREATININE 0.56 0.52 0.66  GLUCOSE 223* 153* 164*  CALCIUM 8.4 8.9 9.0    Lipid Panel     Component Value Date/Time   CHOL 131 12/04/2012 0545   TRIG 76 12/04/2012 0545   HDL 36* 12/04/2012 0545   CHOLHDL 3.6 12/04/2012 0545   VLDL 15 12/04/2012 0545   LDLCALC 80 12/04/2012 0545    CBG (last 3)   Recent Labs  12/10/12 1241 12/10/12 1749 12/10/12 2126  GLUCAP 159* 106* 224*  SSI in last 24: 10 units  Sed rate 70  Lactic acid 1.64  Hgb A1c 9.0  Wound cx 5/20 ED: multiple organisms present, none predominant (no staph aureus, no group A strep) Wound cx 5/21 OR: few proteus vulgaris sensitive to all except ampicillin & cefazolin AFB cx 5/21: no growth to date  HIV negative HCV negative  Imaging/Diagnostic Tests:  L Hand Xray:  No acute fracture or subluxation. Soft tissue swelling mid and distal aspect second third and fourth finger. Limited study by metallic rings.  Assessment/Plan:  Stephen Fitzgerald is a 48 y.o. year old male with a history of untreated diabetes presenting with frank infection of his left  hand and fingers from imbedded rings. Febrile to 100.7 and tachycardia present on admission so patient met sepsis criteria. Overall clinically well-appearing except for his localized infection.   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 - per ID, okay to d/c home with 2 weeks of PO levaquin - tylenol PRN fever, dilaudid/oxy prn pain - HIV & Hep C negative - continue benadryl prn for rash  ENDO:  # Diabetes: glucose >500 on BMET on admission. Chronic and untreated. Needle aversion makes insulin not ideal for long term treatment. - A1c 9.0 - started glipizide 5mg  on 5/24 - continue moderate SSI for additional coverage - carb modified diet  # Hyperlipidemia - pt is on statin at home (simva 40 daily) but this is given to him by his father, not prescribed for him by a doctor  - continue this dose for now  - lipids appear well controlled (LDL 80)  CARDIOVASCULAR:  # Elevated BP's:  - improved after initiating metoprolol 25mg  BID, continue this regimen # Tachycardia: likely related to patients infection and recent surgery - resolved  NEURO:  # Hx of stroke:  - continue aspirin daily   PSYCH:  # Alcohol Use: reports drinking several beers per day  - had observed pt on CIWA, with scores 0-2. Unlikely to withdraw at this point. - UDS negative # Behavior:  - Pt exhibits some strange behaviors and does not seem to fully appreciate the severity of his infection. Has gone for months likely with frank infection of his hand, without seeking care. Does not seem to pick up on social cues and is very difficult to redirect, getting fixated on certain topics throughout conversation. This behavior along with the hx provided by his father of a neat and orderly apartment suggests possible autism spectrum disorder. Will treat patient's immediate medical needs now (hand infection and diabetes) and consult psychiatry if need arises in the future.   FEN/GI:  - SLIV - carb modified  SOCIAL:  # Dispo:  - pending clinical improvement. Will touch base with pt's father today to discuss dispo planning.  May be able to go home since does not need IV abx. # Code Status:  -did not discuss, presume full code  Levert Feinstein, MD [facility] Medicine PGY-1 Service Pager [phone]

## 2012-12-11 NOTE — Clinical Social Work Note (Signed)
Patient assessed and is agreeable to SNF for ST rehab (full assessment to follow). CSW sent patient clinicals out to SNF's and will follow-up with patient with facility responses.  Genelle Bal, MSW, LCSW [phone]

## 2012-12-11 NOTE — Evaluation (Signed)
Occupational Therapy Evaluation and Discharge  Patient Details Name: Stephen Fitzgerald MRN: [id number] DOB: Nov 22, 1960 Today's Date: 12/11/2012 Time: 7829-5621 OT Time Calculation (min): 34 min  OT Assessment / Plan / Recommendation Clinical Impression  This 52 yo male admitted with sepsis of left hand due to imbeded rings in fingers presents to acute OT with all education completed. Per RN case manager his mom and dad will help him with groceries and meals. Acute OT will sign off.       Follow Up Recommendations   (Follow up per hand MD)       Equipment Recommendations  None recommended by OT          Precautions / Restrictions Restrictions Weight Bearing Restrictions: Yes LUE Weight Bearing: Non weight bearing Other Position/Activity Restrictions: through hand       ADL  Eating/Feeding: Simulated;Modified independent Where Assessed - Eating/Feeding: Edge of bed Grooming: Simulated;Modified independent Where Assessed - Grooming: Unsupported standing Upper Body Bathing: Simulated;Modified independent Where Assessed - Upper Body Bathing: Unsupported sit to stand Lower Body Bathing: Simulated;Modified independent Where Assessed - Lower Body Bathing: Unsupported sit to stand Upper Body Dressing: Simulated;Modified independent Where Assessed - Upper Body Dressing: Unsupported sitting Lower Body Dressing: Simulated;Moderate assistance (for boots, says he has larger ones at home) Where Assessed - Lower Body Dressing: Unsupported sit to stand Toilet Transfer: Performed;Modified independent Toilet Transfer Method: Sit to Barista: Regular height toilet;Grab bars (has tub on right side at home) Toileting - Architect and Hygiene: Simulated;Modified independent Where Assessed - Toileting Clothing Manipulation and Hygiene: Standing Equipment Used:  (None) Transfers/Ambulation Related to ADLs: Mod I for all ADL Comments: Pt made aware that is  left hand should be higher than his elbow when he is at rest (sitting); instructed pt in moving his thumb within constraints of the casting/splinting as well as gentle PROM on tips of his 2 fingers that he can get to within constraints of his casting/splinting.          Visit Information  Last OT Received On: 12/11/12 Assistance Needed: +1    Subjective Data  Subjective: If my dad says he can he can, if he says he can't he can't  (in regards to pt staying with his parents short term)   Prior Functioning     Home Living Lives With: Alone Available Help at Discharge: Family;Available PRN/intermittently Type of Home: Apartment Home Access: Stairs to enter Entrance Stairs-Number of Steps: 2-3 Home Layout: One level Bathroom Shower/Tub:  (Pt will sponge bath--not get LUE wet) Bathroom Toilet: Standard Home Adaptive Equipment: None Prior Function Level of Independence: Independent Able to Take Stairs?: Yes Driving: Yes Vocation: On disability Communication Communication:  (dysarthric from old CVA) Dominant Hand: Right         Vision/Perception Vision - History Baseline Vision: No visual deficits   Cognition  Cognition Arousal/Alertness: Awake/alert Behavior During Therapy: WFL for tasks assessed/performed Overall Cognitive Status: History of cognitive impairments - at baseline    Extremity/Trunk Assessment Right Upper Extremity Assessment RUE ROM/Strength/Tone: Within functional levels Left Upper Extremity Assessment LUE ROM/Strength/Tone: Deficits LUE ROM/Strength/Tone Deficits: wrist and distally due to casting/splinting     Mobility Bed Mobility Bed Mobility: Supine to Sit Supine to Sit: 6: Modified independent (Device/Increase time);HOB elevated Transfers Sit to Stand: 6: Modified independent (Device/Increase time);With upper extremity assist;From bed           End of Session OT - End of Session Equipment Utilized During Treatment:  (  none) Activity  Tolerance: Patient tolerated treatment well Patient left: in chair;with call bell/phone within reach       Evette Georges [phone] 12/11/2012, 12:42 PM

## 2012-12-11 NOTE — Progress Notes (Signed)
Seen and examined.  Feels OK.  Dispo is now the issue.  Discussed at length with Tinnie Gens, his father and case Production designer, theatre/television/film.  Dispo option #1 is to SNF for rehab.  Feel SNF needed in that he cannot drive, has cognitive impairment from stroke, barely able to do ADLs on a good day - now cannot dress or fix own meals.  Considering that he let himself get into the shape he was (rings growing into fingers causing amputation) and delayed seeking help, I am quite concerned with his recovery from surgery and graph care. Fallback position is home with 43 yo father helping care for him.  I have concerns and really hope he qualifies for short term rehab.

## 2012-12-12 DIAGNOSIS — L02419 Cutaneous abscess of limb, unspecified: Secondary | ICD-10-CM | POA: Diagnosis not present

## 2012-12-12 DIAGNOSIS — E119 Type 2 diabetes mellitus without complications: Secondary | ICD-10-CM | POA: Diagnosis not present

## 2012-12-12 DIAGNOSIS — E109 Type 1 diabetes mellitus without complications: Secondary | ICD-10-CM | POA: Diagnosis not present

## 2012-12-12 DIAGNOSIS — L089 Local infection of the skin and subcutaneous tissue, unspecified: Secondary | ICD-10-CM | POA: Diagnosis not present

## 2012-12-12 DIAGNOSIS — M009 Pyogenic arthritis, unspecified: Secondary | ICD-10-CM | POA: Diagnosis not present

## 2012-12-12 DIAGNOSIS — I6990 Unspecified sequelae of unspecified cerebrovascular disease: Secondary | ICD-10-CM | POA: Diagnosis not present

## 2012-12-12 DIAGNOSIS — L02519 Cutaneous abscess of unspecified hand: Secondary | ICD-10-CM | POA: Diagnosis not present

## 2012-12-12 DIAGNOSIS — M25549 Pain in joints of unspecified hand: Secondary | ICD-10-CM | POA: Diagnosis not present

## 2012-12-12 DIAGNOSIS — Z8673 Personal history of transient ischemic attack (TIA), and cerebral infarction without residual deficits: Secondary | ICD-10-CM | POA: Diagnosis not present

## 2012-12-12 DIAGNOSIS — L03019 Cellulitis of unspecified finger: Secondary | ICD-10-CM | POA: Diagnosis not present

## 2012-12-12 DIAGNOSIS — S61409A Unspecified open wound of unspecified hand, initial encounter: Secondary | ICD-10-CM | POA: Diagnosis not present

## 2012-12-12 DIAGNOSIS — M20099 Other deformity of finger(s), unspecified finger(s): Secondary | ICD-10-CM | POA: Diagnosis not present

## 2012-12-12 DIAGNOSIS — Z888 Allergy status to other drugs, medicaments and biological substances status: Secondary | ICD-10-CM | POA: Diagnosis not present

## 2012-12-12 DIAGNOSIS — L03119 Cellulitis of unspecified part of limb: Secondary | ICD-10-CM | POA: Diagnosis not present

## 2012-12-12 DIAGNOSIS — Z5189 Encounter for other specified aftercare: Secondary | ICD-10-CM | POA: Diagnosis not present

## 2012-12-12 DIAGNOSIS — G8918 Other acute postprocedural pain: Secondary | ICD-10-CM | POA: Diagnosis not present

## 2012-12-12 DIAGNOSIS — I6350 Cerebral infarction due to unspecified occlusion or stenosis of unspecified cerebral artery: Secondary | ICD-10-CM | POA: Diagnosis not present

## 2012-12-12 DIAGNOSIS — T8740 Infection of amputation stump, unspecified extremity: Secondary | ICD-10-CM | POA: Diagnosis not present

## 2012-12-12 DIAGNOSIS — E1165 Type 2 diabetes mellitus with hyperglycemia: Secondary | ICD-10-CM | POA: Diagnosis not present

## 2012-12-12 DIAGNOSIS — I10 Essential (primary) hypertension: Secondary | ICD-10-CM | POA: Diagnosis not present

## 2012-12-12 LAB — GLUCOSE, CAPILLARY: Glucose-Capillary: 152 mg/dL — ABNORMAL HIGH (ref 70–99)

## 2012-12-12 NOTE — Progress Notes (Signed)
[facility] discharged Skilled nursing facility per MD order.  Report called to receiving Amy, RN at [facility]    Medication List    TAKE these medications       aspirin 325 MG tablet  Take 325 mg by mouth daily.     diphenhydrAMINE 50 MG capsule  Commonly known as:  BENADRYL  Take 1 capsule (50 mg total) by mouth every 6 (six) hours as needed for itching or allergies.     glipiZIDE 5 MG tablet  Commonly known as:  GLUCOTROL  Take 1 tablet (5 mg total) by mouth 2 (two) times daily before a meal.     levofloxacin 750 MG tablet  Commonly known as:  LEVAQUIN  Take 1 tablet (750 mg total) by mouth daily. For two weeks     metoprolol tartrate 25 MG tablet  Commonly known as:  LOPRESSOR  Take 1 tablet (25 mg total) by mouth 2 (two) times daily.     oxyCODONE-acetaminophen 5-325 MG per tablet  Commonly known as:  PERCOCET/ROXICET  Take 1 tablet by mouth every 4 (four) hours as needed for pain.     polyethylene glycol packet  Commonly known as:  MIRALAX / GLYCOLAX  Take 17 g by mouth 2 (two) times daily as needed (constipation).     simvastatin 40 MG tablet  Commonly known as:  ZOCOR  Take 1 tablet (40 mg total) by mouth every evening.     vitamin B-12 100 MCG tablet  Commonly known as:  CYANOCOBALAMIN  Take 50 mcg by mouth daily.        Patients skin is clean, dry and intact, no evidence of skin break down. IV site discontinued and catheter remains intact. Site without signs and symptoms of complications. Dressing and pressure applied.  Patient transported via wheelchair by Nurse extern to car for father to transport.   No distress noted upon discharge. Laural Benes, Noelle Hoogland C 12/12/2012 2:58 PM

## 2012-12-12 NOTE — Progress Notes (Signed)
Clinical Social Work  CSW faxed DC summary to [employer] who is agreeable to admission today. CSW prepared DC packet with signed FL2 and hard scripts included. CSW informed patient, dad and RN of DC plans and all parties agreeable. CSW left report number for RN on chart copy. Dad will transport patient to SNF. CSW is signing off but available if further needs arise.  Unk Lightning, LCSW (Coverage)

## 2012-12-12 NOTE — Clinical Social Work Placement (Addendum)
Clinical Social Work Department CLINICAL SOCIAL WORK PLACEMENT NOTE 12/12/2012  Patient:  URBANO, MILHOUSE  Account Number:  [id number] Admit date:  12/03/2012  Clinical Social Worker:  Genelle Bal, LCSW  Date/time:  12/12/2012 10:16 AM  Clinical Social Work is seeking post-discharge placement for this patient at the following level of care:   SKILLED NURSING   (*CSW will update this form in Epic as items are completed)   12/11/2012  Patient/family provided with [facility] Department of Clinical Social Work's list of facilities offering this level of care within the geographic area requested by the patient (or if unable, by the patient's family).  12/11/2012  Patient/family informed of their freedom to choose among providers that offer the needed level of care, that participate in Medicare, Medicaid or managed care program needed by the patient, have an available bed and are willing to accept the patient.    Patient/family informed of [facility]' ownership interest in [facility], as well as of the fact that they are under no obligation to receive care at this facility.  PASARR submitted to EDS on 12/12/2012 PASARR number received from EDS on 12/12/12  FL2 transmitted to all facilities in geographic area requested by pt/family on  12/11/2012 FL2 transmitted to all facilities within larger geographic area on   Patient informed that his/her managed care company has contracts with or will negotiate with  certain facilities, including the following:     Patient/family informed of bed offers received:  12/12/12 Patient chooses bed at [facility] Physician recommends and patient chooses bed at    Patient to be transferred to  [facility] on  12/12/12 Patient to be transferred to facility by Parents  The following physician request were entered in Epic:   Additional Comments:

## 2012-12-12 NOTE — Clinical Social Work Psychosocial (Signed)
Clinical Social Work Department BRIEF PSYCHOSOCIAL ASSESSMENT 12/12/2012  Patient:  Stephen Fitzgerald, Stephen Fitzgerald     Account Number:  [id number]     Admit date:  12/03/2012  Clinical Social Worker:  Delmer Islam  Date/Time:  12/12/2012 08:23 AM  Referred by:  Physician  Date Referred:  12/11/2012 Referred for  SNF Placement   Other Referral:   Interview type:  Patient Other interview type:   CSW initially talked with Dr. Leveda Anna regarding patient and his need for ST SNF prior to going home.    PSYCHOSOCIAL DATA Living Status:  ALONE Admitted from facility:   Level of care:   Primary support name:  Sharlette Dense Primary support relationship to patient:  PARENT Degree of support available:   Good support from 51 year old father and also his mother.    CURRENT CONCERNS Current Concerns  Post-Acute Placement   Other Concerns:    SOCIAL WORK ASSESSMENT / PLAN On 12/11/12 CSW talked with Dr. Leveda Anna regarding patient. MD reported that patient is cognitively impaired and lives alones. Patient had surgery on his hand and a couple of fingers amputated and cannot care for himself at this time as he cannot use his hand. Patient has family but they are elderly. Dr. Leveda Anna very concerned about patient's ability to care for himself at discharge. He talked with patient about going to a SNF and patient in agreement.    On 12/11/12 CSW introduced self and stated purpose of visit. Patient in agreement with ST rehab while his hand heals. CSW explained SNF search process and patient given [facility] list. Patient requested that CSW talk with his father about facilities as he knows nothing about them.   Assessment/plan status:  Psychosocial Support/Ongoing Assessment of Needs Other assessment/ plan:   Information/referral to community resources:    PATIENT'S/FAMILY'S RESPONSE TO PLAN OF CARE: Patient open to talking with CSW and in agreement with going to a SNF short-term.

## 2012-12-12 NOTE — Progress Notes (Signed)
Physical Therapy Treatment Patient Details Name: Stephen Fitzgerald MRN: [id number] DOB: Jun 28, 1961 Today's Date: 12/12/2012 Time: 0931-1002 PT Time Calculation (min): 31 min  PT Assessment / Plan / Recommendation Comments on Treatment Session  Pt has some decreased activity tolerance when first coming from supine to sit, but overall is improving in mobility.  He will need intermittent assist at d/c...question is assisted living or group home would be available if SNF is not approved?  Will continue to follow while he is and inpaitient and recommend that he walk ad lib with family and nursing.    Follow Up Recommendations  No PT follow up     Does the patient have the potential to tolerate intense rehabilitation     Barriers to Discharge        Equipment Recommendations  None recommended by PT    Recommendations for Other Services    Frequency Min 3X/week   Plan Discharge plan remains appropriate    Precautions / Restrictions Restrictions Weight Bearing Restrictions: Yes LUE Weight Bearing: Non weight bearing Other Position/Activity Restrictions: through hand   Pertinent Vitals/Pain No c/o pain    Mobility  Bed Mobility Bed Mobility: Supine to Sit Supine to Sit: 6: Modified independent (Device/Increase time);HOB elevated Details for Bed Mobility Assistance: pt c/o feeling a little 'high" in his head upon coming from supine to sitting Transfers Transfers: Sit to Stand Sit to Stand: 6: Modified independent (Device/Increase time);With upper extremity assist;From bed Ambulation/Gait Ambulation/Gait Assistance: 5: Supervision Ambulation Distance (Feet): 600 Feet (150, 450) Assistive device: None Ambulation/Gait Assistance Details: pt continues to be slightly unsteady. He initiated touching my shoulder for support to negotiate through a crowded hallway  and then released the tactile support once he was through the obstacle Gait Pattern: Step-through pattern;Decreased stride  length (decreased arm swing. Kept left arm elevated) Gait velocity: WFL General Gait Details: Pt appears to be doing well with increased activity  Stairs: Yes Stairs Assistance: 4: Min assist Stairs Assistance Details (indicate cue type and reason): pt had some difficulty negotiating stairs, especially coming down the steps .  He chose to step down backwards so he could keep holding onto rail with right hand Stair Management Technique: One rail Right;Forwards;Backwards Number of Stairs: 2 Corporate treasurer: No    Exercises General Exercises - Lower Extremity Straight Leg Raises: AROM;Both;10 reps;Supine Hip Flexion/Marching: AROM;Both;10 reps;Supine   PT Diagnosis:    PT Problem List:   PT Treatment Interventions:     PT Goals Acute Rehab PT Goals PT Goal: Ambulate - Progress: Progressing toward goal Additional Goals PT Goal: Additional Goal #1 - Progress: Met  Visit Information  Last PT Received On: 12/12/12    Subjective Data  Subjective: Pt unable to relay what happened to him. He states the hospital gown was hurting his neck, but he didn't do anything to relieve the pressure. Patient Stated Goal: Pt agreeable to get up and walk   Cognition  Cognition Arousal/Alertness: Awake/alert Behavior During Therapy: WFL for tasks assessed/performed Overall Cognitive Status: History of cognitive impairments - at baseline    Balance  Balance Balance Assessed: Yes Static Standing Balance Static Standing - Balance Support: No upper extremity supported Static Standing - Level of Assistance: 7: Independent Standardized Balance Assessment Standardized Balance Assessment: Dynamic Gait Index Dynamic Gait Index Level Surface: Normal (likely at baseline) Change in Gait Speed: Normal Gait with Horizontal Head Turns: Mild Impairment Gait with Vertical Head Turns: Mild Impairment Gait and Pivot Turn: Normal Step Over  Obstacle: Mild Impairment Step Around  Obstacles: Mild Impairment Steps: Mild Impairment Total Score: 19 High Level Balance High Level Balance Activites: Backward walking;Direction changes;Turns High Level Balance Comments: Pt has some gait impairment from previous issues and tends to slow down when balance is challenged but overall  is doing well with gait  End of Session PT - End of Session Activity Tolerance: Patient tolerated treatment well Patient left: in bed Nurse Communication: Mobility status   GP    Rosey Bath K. Manson Passey, [place] [phone] 12/12/2012, 10:23 AM

## 2012-12-12 NOTE — Progress Notes (Signed)
Family Medicine Teaching Service Daily Progress Note Service Pager: [phone]  Patient Assessment: Stephen Fitzgerald is a 52 y.o. year old male with a history of untreated diabetes presenting with frank infection of his left hand and fingers from imbedded rings.  Subjective: Pain remains well controlled. No further itching but does still have rash. Social work is in process of finding SNF bed.  Objective: Temp:  [98.2 F (36.8 C)-98.7 F (37.1 C)] 98.2 F (36.8 C) (05/29 0556) Pulse Rate:  [92-106] 92 (05/29 0556) Resp:  [18-20] 18 (05/29 0556) BP: (105-125)/(70-80) 120/80 mmHg (05/29 0556) SpO2:  [93 %-99 %] 97 % (05/29 0556) Exam: General: NAD, resting comfortably in bed Cardiovascular: RRR Respiratory: NWOB, CTAB Extremities: L hand and forearm heavily wrapped, did not unwrap.  Neuro: grossly nonfocal, speech intact Ext: calves nontender to palpation Derm: diffuse maculopapular rash covering left arm and back, continues to improve but is still present  I have reviewed the patient's medications, labs, imaging, and diagnostic testing.  Notable results are summarized below.  Medications:  Scheduled Meds: . aspirin  325 mg Oral Daily  . enoxaparin (LOVENOX) injection  40 mg Subcutaneous Q24H  . folic acid  1 mg Oral Daily  . glipiZIDE  5 mg Oral BID AC  . insulin aspart  0-15 Units Subcutaneous TID WC  . levofloxacin (LEVAQUIN) IV  750 mg Intravenous Q24H  . living well with diabetes book   Does not apply Once  . metoprolol tartrate  25 mg Oral BID  . multivitamin with minerals  1 tablet Oral Daily  . polyethylene glycol  17 g Oral BID  . simvastatin  40 mg Oral QPM  . thiamine  100 mg Oral Daily   Continuous Infusions:   PRN Meds:.acetaminophen, acetaminophen, bisacodyl, diphenhydrAMINE, hydrALAZINE, HYDROmorphone (DILAUDID) injection, oxyCODONE-acetaminophen  Labs:  CBC  Recent Labs Lab 12/06/12 0615 12/09/12 0525 12/11/12 0529  WBC 9.8 8.3 8.1  HGB 10.5*  11.4* 11.4*  HCT 31.3* 34.6* 34.9*  PLT 243 406* 482*    BMET  Recent Labs Lab 12/06/12 0615 12/09/12 0525 12/11/12 0529  NA 136 140 142  K 3.9 3.9 4.0  CL 103 104 103  CO2 23 29 29   BUN 7 6 8   CREATININE 0.56 0.52 0.66  GLUCOSE 223* 153* 164*  CALCIUM 8.4 8.9 9.0    Lipid Panel     Component Value Date/Time   CHOL 131 12/04/2012 0545   TRIG 76 12/04/2012 0545   HDL 36* 12/04/2012 0545   CHOLHDL 3.6 12/04/2012 0545   VLDL 15 12/04/2012 0545   LDLCALC 80 12/04/2012 0545    CBG (last 3)   Recent Labs  12/11/12 1741 12/11/12 2121 12/12/12 0802  GLUCAP 132* 127* 145*  SSI in last 24: 8 units  Sed rate 70  Lactic acid 1.64  Hgb A1c 9.0  Wound cx 5/20 ED: multiple organisms present, none predominant (no staph aureus, no group A strep) Wound cx 5/21 OR: few proteus vulgaris sensitive to all except ampicillin & cefazolin AFB cx 5/21: no growth to date  HIV negative HCV negative  Imaging/Diagnostic Tests:  L Hand Xray:  No acute fracture or subluxation. Soft tissue swelling mid and distal aspect second third and fourth finger. Limited study by metallic rings.  Assessment/Plan:  Stephen Fitzgerald is a 73 y.o. year old male with a history of untreated diabetes presenting with frank infection of his left hand and fingers from imbedded rings. Febrile to 100.7 and tachycardia present on  admission so patient met sepsis criteria. Overall clinically well-appearing except for his localized infection.   ID:  # Hand infection/sepsis:  No longer tachycardic or febrile - s/p surgery on 5/21, repeated surgery on 5/23 for amputation of index finger - per hand surgery note 5/26, okay to d/c home with antibiotics and f/u in surgery clinic in 7-10 days - no blood cx obtained as pt already had a dose of ancef in ER, but did obtain wound culture (initially polymicrobial, second wound cx from OR shows proteus vulgaris) - currently on IV levaquin as pt had eruption after getting  unasyn. - per ID, okay to d/c with 2 weeks of PO levaquin - tylenol PRN fever, dilaudid/oxy prn pain - HIV & Hep C negative - continue benadryl prn for rash  ENDO:  # Diabetes: glucose >500 on BMET on admission. Chronic and untreated. Needle aversion makes insulin not ideal for long term treatment. - A1c 9.0 - started glipizide 5mg  on 5/24 - continue moderate SSI for additional coverage - carb modified diet  # Hyperlipidemia - pt is on statin at home (simva 40 daily) but this is given to him by his father, not prescribed for him by a doctor  - continue this dose for now  - lipids appear well controlled (LDL 80)  CARDIOVASCULAR:  # Elevated BP's:  - improved after initiating metoprolol 25mg  BID, continue this regimen # Tachycardia: likely related to patients infection and recent surgery - improved  NEURO:  # Hx of stroke:  - continue aspirin daily   PSYCH:  # Alcohol Use: reports drinking several beers per day  - had observed pt on CIWA, with scores 0-2. Unlikely to withdraw at this point. - UDS negative # Behavior:  - Pt exhibits some strange behaviors and does not seem to fully appreciate the severity of his infection. Has gone for months likely with frank infection of his hand, without seeking care. Does not seem to pick up on social cues and is very difficult to redirect, getting fixated on certain topics throughout conversation. This behavior along with the hx provided by his father of a neat and orderly apartment suggests possible autism spectrum disorder. Will treat patient's immediate medical needs now (hand infection and diabetes) and consult psychiatry if need arises in the future.   FEN/GI:  - SLIV - carb modified  SOCIAL:  # Dispo:  - d/c to SNF today # Code Status:  -did not discuss, presume full code  Stephen Feinstein, MD [facility] Medicine PGY-1 Service Pager [phone]

## 2012-12-12 NOTE — Progress Notes (Signed)
Seen and examined.  Agree with Dr. Pollie Meyer.  Medically stable.  OK to DC to SNF today if bed available.

## 2012-12-12 NOTE — Discharge Summary (Signed)
Seen and examined.  Agree with DC as outlined by Dr. Pollie Meyer.

## 2012-12-12 NOTE — Progress Notes (Signed)
Clinical Social Work  CSW spoke with patient and dad who chose [facility] for facility to admit to at DC. CSW spoke with SNF who can accept patient today. CSW paged MD in order to inform that patient had a bed ready.  Unk Lightning, LCSW (Coverage)

## 2012-12-13 DIAGNOSIS — E119 Type 2 diabetes mellitus without complications: Secondary | ICD-10-CM | POA: Diagnosis not present

## 2012-12-18 ENCOUNTER — Other Ambulatory Visit: Payer: Self-pay | Admitting: Orthopedic Surgery

## 2012-12-18 ENCOUNTER — Encounter (HOSPITAL_BASED_OUTPATIENT_CLINIC_OR_DEPARTMENT_OTHER): Payer: Self-pay | Admitting: *Deleted

## 2012-12-18 NOTE — Progress Notes (Signed)
Pt lived alone post cva several yrs ago-keeps a organized home, but did not have any medical care-expressive aphagia and difficulty communicating was caring for self -now at [facility] for rehab and wound observation-father to bring pt and take back to assisted living-

## 2012-12-19 NOTE — H&P (Signed)
Stephen Fitzgerald is an 52 y.o. male.   Chief Complaint: left hand wounds HPI: this patient is a 52 year old male with a complex left hand infection problem.  He has undergone 2 previous procedures and returns for division and insetting of full a flap from the index to the ring fingers as well as debridement of the major hand wound and revision Ray amputation of the index finger.  Past Medical History  Diagnosis Date  . Diabetes mellitus without complication   . Stroke     expressive aphasia  . Hypertension     Past Surgical History  Procedure Laterality Date  . No past surgeries    . I&d extremity Left 12/04/2012    Procedure: IRRIGATION AND DEBRIDEMENT Left Hand with Ring Removal  times three, Carpal Tunnel , Flexor tendon Synovectomy;  Surgeon: [name], MD;  Location: [facility] OR;  Service: Orthopedics;  Laterality: Left;  . Amputation Left 12/06/2012    Procedure: AMPUTATION DIGIT LEFT INDEX;  Surgeon: [name], MD;  Location: [facility] OR;  Service: Orthopedics;  Laterality: Left;  . I&d extremity Left 12/06/2012    Procedure: IRRIGATION AND DEBRIDEMENT EXTREMITY LEFT HAND;  Surgeon: [name], MD;  Location: [facility] OR;  Service: Orthopedics;  Laterality: Left;  . Hernia repair      bilat ing age 18months    History reviewed. No pertinent family history. Social History:  reports that he has never smoked. He does not have any smokeless tobacco history on file. He reports that  drinks alcohol. He reports that he does not use illicit drugs.  Allergies:  Allergies  Allergen Reactions  . Unasyn (Ampicillin-Sulbactam Sodium) Rash    No prescriptions prior to admission    No results found for this or any previous visit (from the past 48 hour(s)). No results found.  Review of Systems  All other systems reviewed and are negative.    Height 6' (1.829 m), weight 80.287 kg (177 lb). Physical Exam  Constitutional:  WD, WN, NAD HEENT:  NCAT, EOMI Neuro/Psych:  Alert &  oriented to person, place, and time; appropriate mood & affect Lymphatic: No generalized UE edema or lymphadenopathy Extremities / MSK:  Both UE are normal with respect to appearance, ranges of motion, joint stability, muscle strength/tone, sensation, & perfusion except as otherwise noted:  The flap has survived and is healthy.  There is scant drainage from the distal aspect of the hand incision and the hand remains a little swollen  Assessment:  Complex left hand problem  Plan: To the operating room for division and insetting of the fillet flap to the ring finger.  In addition revision of the index ray amputation of be performed and additional debridement as necessary.  The details of the operative procedure were discussed with the patient.  Questions were invited and answered.  In addition to the goal of the procedure, the risks of the procedure to include but not limited to bleeding; infection; damage to the nerves or blood vessels that could result in bleeding, numbness, weakness, chronic pain, and the need for additional procedures; stiffness; the need for revision surgery; and anesthetic risks, the worst of which is death, were reviewed.  No specific outcome was guaranteed or implied.  Informed consent was obtained.   Kellon Chalk A. 12/19/2012, 3:26 PM

## 2012-12-19 NOTE — Progress Notes (Signed)
Talked with nurse at [facility] preop instructions and faxed instructions to be npo p mn-and arrive with dad-take atenolol with water only.

## 2012-12-23 ENCOUNTER — Encounter (HOSPITAL_BASED_OUTPATIENT_CLINIC_OR_DEPARTMENT_OTHER): Payer: Self-pay | Admitting: Anesthesiology

## 2012-12-23 ENCOUNTER — Encounter (HOSPITAL_BASED_OUTPATIENT_CLINIC_OR_DEPARTMENT_OTHER): Payer: Self-pay | Admitting: *Deleted

## 2012-12-23 ENCOUNTER — Ambulatory Visit (HOSPITAL_BASED_OUTPATIENT_CLINIC_OR_DEPARTMENT_OTHER)
Admission: RE | Admit: 2012-12-23 | Discharge: 2012-12-23 | Disposition: A | Discharge status: Home / Self Care | Payer: Medicare Other | Source: Ambulatory Visit | Attending: Orthopedic Surgery | Admitting: Orthopedic Surgery

## 2012-12-23 ENCOUNTER — Ambulatory Visit (HOSPITAL_BASED_OUTPATIENT_CLINIC_OR_DEPARTMENT_OTHER): Payer: Medicare Other | Admitting: Anesthesiology

## 2012-12-23 ENCOUNTER — Encounter (HOSPITAL_BASED_OUTPATIENT_CLINIC_OR_DEPARTMENT_OTHER)
Admission: RE | Disposition: A | Discharge status: Home / Self Care | Payer: Self-pay | Source: Ambulatory Visit | Attending: Orthopedic Surgery

## 2012-12-23 DIAGNOSIS — Y836 Removal of other organ (partial) (total) as the cause of abnormal reaction of the patient, or of later complication, without mention of misadventure at the time of the procedure: Secondary | ICD-10-CM | POA: Insufficient documentation

## 2012-12-23 DIAGNOSIS — T8740 Infection of amputation stump, unspecified extremity: Secondary | ICD-10-CM | POA: Diagnosis not present

## 2012-12-23 DIAGNOSIS — I10 Essential (primary) hypertension: Secondary | ICD-10-CM | POA: Diagnosis not present

## 2012-12-23 DIAGNOSIS — Z888 Allergy status to other drugs, medicaments and biological substances status: Secondary | ICD-10-CM | POA: Insufficient documentation

## 2012-12-23 DIAGNOSIS — E119 Type 2 diabetes mellitus without complications: Secondary | ICD-10-CM | POA: Diagnosis not present

## 2012-12-23 DIAGNOSIS — S61409A Unspecified open wound of unspecified hand, initial encounter: Secondary | ICD-10-CM | POA: Diagnosis not present

## 2012-12-23 DIAGNOSIS — M25549 Pain in joints of unspecified hand: Secondary | ICD-10-CM | POA: Diagnosis not present

## 2012-12-23 DIAGNOSIS — Z8673 Personal history of transient ischemic attack (TIA), and cerebral infarction without residual deficits: Secondary | ICD-10-CM | POA: Diagnosis not present

## 2012-12-23 DIAGNOSIS — M009 Pyogenic arthritis, unspecified: Secondary | ICD-10-CM | POA: Diagnosis not present

## 2012-12-23 DIAGNOSIS — G8918 Other acute postprocedural pain: Secondary | ICD-10-CM | POA: Diagnosis not present

## 2012-12-23 DIAGNOSIS — M20099 Other deformity of finger(s), unspecified finger(s): Secondary | ICD-10-CM | POA: Diagnosis not present

## 2012-12-23 HISTORY — DX: Essential (primary) hypertension: I10

## 2012-12-23 HISTORY — PX: TENDON REPAIR: SHX5111

## 2012-12-23 SURGERY — TENDON REPAIR
Anesthesia: General | Site: Finger | Laterality: Left | Wound class: Dirty or Infected

## 2012-12-23 MED ORDER — LACTATED RINGERS IV SOLN
INTRAVENOUS | Status: DC
  Administered 2012-12-23 (×3): via INTRAVENOUS

## 2012-12-23 MED ORDER — ONDANSETRON HCL 4 MG/2ML IJ SOLN: 4.0000 mg | Freq: Once | INTRAMUSCULAR | Status: DC | PRN

## 2012-12-23 MED ORDER — OXYCODONE HCL 5 MG/5ML PO SOLN: 5.0000 mg | Freq: Once | ORAL | Status: DC | PRN

## 2012-12-23 MED ORDER — BUPIVACAINE-EPINEPHRINE PF 0.5-1:200000 % IJ SOLN
INTRAMUSCULAR | Status: DC | PRN
  Administered 2012-12-23: 25 mL

## 2012-12-23 MED ORDER — HYDROMORPHONE HCL PF 1 MG/ML IJ SOLN: 0.2500 mg | INTRAMUSCULAR | Status: DC | PRN

## 2012-12-23 MED ORDER — PHENYLEPHRINE HCL 10 MG/ML IJ SOLN
INTRAMUSCULAR | Status: DC | PRN
  Administered 2012-12-23: 100 ug via INTRAVENOUS
  Administered 2012-12-23: 80 ug via INTRAVENOUS

## 2012-12-23 MED ORDER — LIDOCAINE HCL (CARDIAC) 20 MG/ML IV SOLN
INTRAVENOUS | Status: DC | PRN
  Administered 2012-12-23: 50 mg via INTRAVENOUS

## 2012-12-23 MED ORDER — HYDROCODONE-ACETAMINOPHEN 5-325 MG PO TABS: 1.0000 | ORAL_TABLET | ORAL | Status: DC | PRN

## 2012-12-23 MED ORDER — VANCOMYCIN HCL IN DEXTROSE 1-5 GM/200ML-% IV SOLN
1000.0000 mg | INTRAVENOUS | Status: AC
  Administered 2012-12-23: 1000 mg via INTRAVENOUS

## 2012-12-23 MED ORDER — FENTANYL CITRATE 0.05 MG/ML IJ SOLN
INTRAMUSCULAR | Status: DC | PRN
  Administered 2012-12-23 (×3): 25 ug via INTRAVENOUS

## 2012-12-23 MED ORDER — FENTANYL CITRATE 0.05 MG/ML IJ SOLN
50.0000 ug | INTRAMUSCULAR | Status: DC | PRN
  Administered 2012-12-23: 100 ug via INTRAVENOUS

## 2012-12-23 MED ORDER — MIDAZOLAM HCL 2 MG/2ML IJ SOLN
1.0000 mg | INTRAMUSCULAR | Status: DC | PRN
  Administered 2012-12-23: 2 mg via INTRAVENOUS

## 2012-12-23 MED ORDER — DEXAMETHASONE SODIUM PHOSPHATE 4 MG/ML IJ SOLN
INTRAMUSCULAR | Status: DC | PRN
  Administered 2012-12-23: 4 mg via INTRAVENOUS

## 2012-12-23 MED ORDER — PROPOFOL 10 MG/ML IV BOLUS
INTRAVENOUS | Status: DC | PRN
  Administered 2012-12-23: 200 mg via INTRAVENOUS

## 2012-12-23 MED ORDER — OXYCODONE HCL 5 MG PO TABS: 5.0000 mg | ORAL_TABLET | Freq: Once | ORAL | Status: DC | PRN

## 2012-12-23 MED ORDER — ONDANSETRON HCL 4 MG/2ML IJ SOLN
INTRAMUSCULAR | Status: DC | PRN
  Administered 2012-12-23: 4 mg via INTRAVENOUS

## 2012-12-23 MED ORDER — LACTATED RINGERS IV SOLN
INTRAVENOUS | Status: DC
  Administered 2012-12-23: 08:00:00 via INTRAVENOUS

## 2012-12-23 SURGICAL SUPPLY — 64 items
BANDAGE GAUZE ELAST BULKY 4 IN (GAUZE/BANDAGES/DRESSINGS) ×4 IMPLANT
BLADE MINI RND TIP GREEN BEAV (BLADE) IMPLANT
BLADE SURG 15 STRL LF DISP TIS (BLADE) ×1 IMPLANT
BLADE SURG 15 STRL SS (BLADE) ×1
BNDG COHESIVE 3X5 TAN STRL LF (GAUZE/BANDAGES/DRESSINGS) ×2 IMPLANT
BNDG ESMARK 4X9 LF (GAUZE/BANDAGES/DRESSINGS) ×2 IMPLANT
CHLORAPREP W/TINT 26ML (MISCELLANEOUS) ×2 IMPLANT
CLOTH BEACON ORANGE TIMEOUT ST (SAFETY) ×2 IMPLANT
CORDS BIPOLAR (ELECTRODE) ×2 IMPLANT
COVER MAYO STAND STRL (DRAPES) ×2 IMPLANT
COVER TABLE BACK 60X90 (DRAPES) ×2 IMPLANT
CUFF TOURNIQUET SINGLE 18IN (TOURNIQUET CUFF) ×2 IMPLANT
DECANTER SPIKE VIAL GLASS SM (MISCELLANEOUS) IMPLANT
DRAPE EXTREMITY T 121X128X90 (DRAPE) ×2 IMPLANT
DRAPE SURG 17X23 STRL (DRAPES) ×2 IMPLANT
DRSG EMULSION OIL 3X3 NADH (GAUZE/BANDAGES/DRESSINGS) ×2 IMPLANT
ELECT REM PT RETURN 9FT ADLT (ELECTROSURGICAL)
ELECTRODE REM PT RTRN 9FT ADLT (ELECTROSURGICAL) IMPLANT
GLOVE BIO SURGEON STRL SZ 6.5 (GLOVE) ×2 IMPLANT
GLOVE BIO SURGEON STRL SZ7.5 (GLOVE) ×2 IMPLANT
GLOVE BIOGEL PI IND STRL 7.0 (GLOVE) ×1 IMPLANT
GLOVE BIOGEL PI IND STRL 8 (GLOVE) ×2 IMPLANT
GLOVE BIOGEL PI IND STRL 8.5 (GLOVE) ×1 IMPLANT
GLOVE BIOGEL PI INDICATOR 7.0 (GLOVE) ×1
GLOVE BIOGEL PI INDICATOR 8 (GLOVE) ×2
GLOVE BIOGEL PI INDICATOR 8.5 (GLOVE) ×1
GOWN PREVENTION PLUS XLARGE (GOWN DISPOSABLE) ×6 IMPLANT
LOOP VESSEL MAXI BLUE (MISCELLANEOUS) IMPLANT
LOOP VESSEL MINI RED (MISCELLANEOUS) IMPLANT
NEEDLE HYPO 25X1 1.5 SAFETY (NEEDLE) IMPLANT
NS IRRIG 1000ML POUR BTL (IV SOLUTION) ×2 IMPLANT
PACK BASIN DAY SURGERY FS (CUSTOM PROCEDURE TRAY) ×2 IMPLANT
PADDING CAST ABS 3INX4YD NS (CAST SUPPLIES)
PADDING CAST ABS 4INX4YD NS (CAST SUPPLIES) ×1
PADDING CAST ABS COTTON 3X4 (CAST SUPPLIES) IMPLANT
PADDING CAST ABS COTTON 4X4 ST (CAST SUPPLIES) ×1 IMPLANT
PENCIL BUTTON HOLSTER BLD 10FT (ELECTRODE) IMPLANT
SLEEVE SCD COMPRESS KNEE MED (MISCELLANEOUS) ×2 IMPLANT
SLING ARM FOAM STRAP LRG (SOFTGOODS) IMPLANT
SPEAR EYE SURG WECK-CEL (MISCELLANEOUS) ×2 IMPLANT
SPLINT FAST PLASTER 5X30 (CAST SUPPLIES)
SPLINT PLASTER CAST FAST 5X30 (CAST SUPPLIES) IMPLANT
SPLINT PLASTER CAST XFAST 3X15 (CAST SUPPLIES) IMPLANT
SPLINT PLASTER XTRA FASTSET 3X (CAST SUPPLIES)
SPONGE GAUZE 4X4 12PLY (GAUZE/BANDAGES/DRESSINGS) ×2 IMPLANT
STOCKINETTE 4X48 STRL (DRAPES) ×2 IMPLANT
SUT ETHIBOND 3-0 V-5 (SUTURE) ×2 IMPLANT
SUT ETHILON 3 0 PS 1 (SUTURE) ×4 IMPLANT
SUT ETHILON 8 0 BV130 4 (SUTURE) IMPLANT
SUT ETHILON 9 0 V 100.4 (SUTURE) ×2 IMPLANT
SUT FIBERWIRE 2-0 18 17.9 3/8 (SUTURE)
SUT MERSILENE 4 0 P 3 (SUTURE) IMPLANT
SUT NYLON 9 0 VRM6 (SUTURE) IMPLANT
SUT PROLENE 6 0 P 1 18 (SUTURE) ×2 IMPLANT
SUT SILK 4 0 PS 2 (SUTURE) IMPLANT
SUT STEEL 4 (SUTURE) IMPLANT
SUT SUPRAMID 4-0 (SUTURE) IMPLANT
SUT VICRYL RAPIDE 4/0 PS 2 (SUTURE) IMPLANT
SUTURE FIBERWR 2-0 18 17.9 3/8 (SUTURE) IMPLANT
SYR BULB 3OZ (MISCELLANEOUS) ×2 IMPLANT
SYRINGE 10CC LL (SYRINGE) IMPLANT
TOWEL OR 17X24 6PK STRL BLUE (TOWEL DISPOSABLE) ×2 IMPLANT
TUBE CONNECTING 20X1/4 (TUBING) IMPLANT
UNDERPAD 30X30 INCONTINENT (UNDERPADS AND DIAPERS) ×2 IMPLANT

## 2012-12-23 NOTE — Op Note (Signed)
12/23/2012  8:23 AM  PATIENT:  Stephen Fitzgerald  52 y.o. male  PRE-OPERATIVE DIAGNOSIS:  Deep infection to left hand, with soft tissue defect requiring augmented flap coverage  POST-OPERATIVE DIAGNOSIS:  Same  PROCEDURE:  Preparation of the recipient site of the ring finger; Division and insetting of the index finger fillet flap to the ring finger, with revision of the index ray amputation requiring soft tissue rearrangement and closure at the previous index finger site.  SURGEON: Cliffton Asters. Janee Morn, MD  PHYSICIAN ASSISTANT: None  ANESTHESIA:  regional and general  SPECIMENS:  None  DRAINS:   None  PREOPERATIVE INDICATIONS:  Stephen Fitzgerald is a  52 y.o. male with a diagnosis of left hand infection with subsequent soft tissue defects requiring advanced coverage. The index finger was removed as a ray amputation, leaving the soft tissues is a fillet flap to cover the ring finger defect. Sufficient time has passed to proceed with stage II--division and insetting.  The risks benefits and alternatives were discussed with the patient preoperatively including but not limited to the risks of infection, bleeding, nerve injury, cardiopulmonary complications, the need for revision surgery, among others, and the patient verbalized understanding and consented to proceed.  OPERATIVE IMPLANTS: None  OPERATIVE FINDINGS: The flap had good color and turgor. Once the flap was divided, tourniquet was released and there was punctate bleeding from the flap edges.  OPERATIVE PROCEDURE:  After receiving prophylactic antibiotics, the patient was escorted to the operative theatre and placed in a supine position.  A surgical "time-out" was performed during which the planned procedure, proposed operative site, and the correct patient identity were compared to the operative consent and agreement confirmed by the circulating nurse according to current facility policy.  Following application of a tourniquet to  the operative extremity, the exposed skin was pre-scrubbed with a Hibiclens scrub brush before being formally prepped with Betadine paint and draped in the usual sterile fashion.    All the previous sutures and the hand and wrist were removed. There was on the ring finger were retained. The hand incision was opened hallway to the mid palm. The skin edges and subcutaneous tissues were sharply debrided and the hand wound. The depths of the wound were debrided with sharp dissection and also with curetting. The flap was divided with more tissue than what would be needed. Punctate bleeding was observed at the edges and theThe limb was exsanguinated with an Esmarch bandage and the tourniquet inflated to approximately higher than systolic BP. Ultimately the recipient site at the ring finger was prepared with some additional excision to include skin and subcutaneous tissues and even flexor tendon. The flap was trimmed to the right size, the recipient site further irrigated, and then the flap was inset with 3-0 nylon suture.   Attention was directed to the hand wound aware additional trimming of the excess fillet flap was performed and the decision was made for how to best rearrange the tissue to close the hand wound. After the debridement and thinning of certain portions of the flap, the tissues were closed with 3-0 nylon interrupted sutures after everything was copiously irrigated.    Bacitracin was applied all of the wounds. The tourniquet was deflated in the flap and the hand pinked up. A bulky dressing was applied with Adaptic, gauze, Kerlix, and Coban and he was taken to recovery room in stable condition.  Disposition: The patient will be discharged home today, returning in 5-7 days to have the dressing  removed, wound is inspected, and begin rehabilitation in order to optimize his function, which I suspect will be quite limited secondary to stiffness

## 2012-12-23 NOTE — Anesthesia Preprocedure Evaluation (Signed)
Anesthesia Evaluation  Patient identified by MRN, date of birth, ID band Patient awake    Reviewed: Allergy & Precautions, H&P , NPO status , Patient's Chart, lab work & pertinent test results, reviewed documented beta blocker date and time   Airway Mallampati: I TM Distance: >3 FB Neck ROM: Full    Dental  (+) Teeth Intact and Dental Advisory Given   Pulmonary  breath sounds clear to auscultation        Cardiovascular hypertension, Pt. on medications and Pt. on home beta blockers Rhythm:Regular Rate:Normal     Neuro/Psych    GI/Hepatic   Endo/Other  diabetes, Well Controlled, Type 2, Oral Hypoglycemic Agents  Renal/GU      Musculoskeletal   Abdominal   Peds  Hematology   Anesthesia Other Findings   Reproductive/Obstetrics                           Anesthesia Physical Anesthesia Plan  ASA: III  Anesthesia Plan: General   Post-op Pain Management:    Induction: Intravenous  Airway Management Planned: LMA  Additional Equipment:   Intra-op Plan:   Post-operative Plan: Extubation in OR  Informed Consent: I have reviewed the patients History and Physical, chart, labs and discussed the procedure including the risks, benefits and alternatives for the proposed anesthesia with the patient or authorized representative who has indicated his/her understanding and acceptance.   Dental advisory given  Plan Discussed with: CRNA, Anesthesiologist and Surgeon  Anesthesia Plan Comments:         Anesthesia Quick Evaluation

## 2012-12-23 NOTE — Anesthesia Postprocedure Evaluation (Signed)
  Anesthesia Post-op Note  Patient: Stephen Fitzgerald  Procedure(s) Performed: Procedure(s): DIVISION AND INSETTING FLAP TO LEFT RING FINGER ADJACENT TISSUE REARRANGEMENT LEFT INDEX FINGER AMPUTATION SITE (Left)  Patient Location: PACU  Anesthesia Type:GA combined with regional for post-op pain  Level of Consciousness: awake, alert  and oriented  Airway and Oxygen Therapy: Patient Spontanous Breathing  Post-op Pain: none  Post-op Assessment: Post-op Vital signs reviewed  Post-op Vital Signs: Reviewed  Complications: No apparent anesthesia complications

## 2012-12-23 NOTE — Interval H&P Note (Signed)
History and Physical Interval Note:  12/23/2012 8:23 AM  Stephen Fitzgerald  has presented today for surgery, with the diagnosis of left hand wounds  The various methods of treatment have been discussed with the patient and family. After consideration of risks, benefits and other options for treatment, the patient has consented to  Procedure(s): DIVISION AND INSETTING FLAP TO LEFT RINGER FINGER ADJACENT TISSUE REARRANGEMENT LEFT INDEX FINGER AMPUTATION SITE (Left) as a surgical intervention .  The patient's history has been reviewed, patient examined, no change in status, stable for surgery.  I have reviewed the patient's chart and labs.  Questions were answered to the patient's satisfaction.     Jezreel Justiniano A.

## 2012-12-23 NOTE — Transfer of Care (Signed)
Immediate Anesthesia Transfer of Care Note  Patient: Stephen Fitzgerald  Procedure(s) Performed: Procedure(s): DIVISION AND INSETTING FLAP TO LEFT RING FINGER ADJACENT TISSUE REARRANGEMENT LEFT INDEX FINGER AMPUTATION SITE (Left)  Patient Location: PACU  Anesthesia Type:GA combined with regional for post-op pain  Level of Consciousness: awake and patient cooperative  Airway & Oxygen Therapy: Patient Spontanous Breathing and Patient connected to face mask oxygen  Post-op Assessment: Report given to PACU RN and Post -op Vital signs reviewed and stable  Post vital signs: Reviewed and stable  Complications: No apparent anesthesia complications

## 2012-12-23 NOTE — Progress Notes (Signed)
  Assisted Dr. Crews with left, ultrasound guided, supraclavicular block. Side rails up, monitors on throughout procedure. See vital signs in flow sheet. Tolerated Procedure well. 

## 2012-12-23 NOTE — Anesthesia Procedure Notes (Addendum)
Anesthesia Regional Block:  Supraclavicular block  Pre-Anesthetic Checklist: ,, timeout performed, Correct Patient, Correct Site, Correct Laterality, Correct Procedure, Correct Position, site marked, Risks and benefits discussed,  Surgical consent,  Pre-op evaluation,  At surgeon's request and post-op pain management  Laterality: Left and Upper  Prep: chloraprep       Needles:  Injection technique: Single-shot  Needle Type: Echogenic Needle     Needle Length: 5cm 5 cm Needle Gauge: 22 and 22 G    Additional Needles:  Procedures: ultrasound guided (picture in chart) Supraclavicular block Narrative:  Start time: 12/23/2012 7:50 AM End time: 12/23/2012 8:00 AM Injection made incrementally with aspirations every 5 mL.  Performed by: Personally  Anesthesiologist: Sheldon Silvan  Supraclavicular block Procedure Name: LMA Insertion Date/Time: 12/23/2012 8:42 AM Performed by: Gar Gibbon Pre-anesthesia Checklist: Patient identified, Emergency Drugs available, Suction available and Patient being monitored Patient Re-evaluated:Patient Re-evaluated prior to inductionOxygen Delivery Method: Circle System Utilized Preoxygenation: Pre-oxygenation with 100% oxygen Intubation Type: IV induction Ventilation: Mask ventilation without difficulty LMA: LMA inserted LMA Size: 4.0 Number of attempts: 1 Airway Equipment and Method: bite block Placement Confirmation: positive ETCO2 Tube secured with: Tape Dental Injury: Teeth and Oropharynx as per pre-operative assessment

## 2012-12-24 ENCOUNTER — Encounter (HOSPITAL_BASED_OUTPATIENT_CLINIC_OR_DEPARTMENT_OTHER): Payer: Self-pay | Admitting: Orthopedic Surgery

## 2012-12-24 LAB — GLUCOSE, CAPILLARY: Glucose-Capillary: 168 mg/dL — ABNORMAL HIGH (ref 70–99)

## 2012-12-25 ENCOUNTER — Ambulatory Visit (INDEPENDENT_AMBULATORY_CARE_PROVIDER_SITE_OTHER): Payer: Medicare Other | Admitting: Infectious Diseases

## 2012-12-25 ENCOUNTER — Encounter: Payer: Self-pay | Admitting: Infectious Diseases

## 2012-12-25 VITALS — BP 104/70 | HR 120 | Temp 98.9°F | Ht 73.0 in | Wt 170.0 lb

## 2012-12-25 DIAGNOSIS — L03119 Cellulitis of unspecified part of limb: Secondary | ICD-10-CM | POA: Diagnosis not present

## 2012-12-25 DIAGNOSIS — L02519 Cutaneous abscess of unspecified hand: Secondary | ICD-10-CM

## 2012-12-25 DIAGNOSIS — E1165 Type 2 diabetes mellitus with hyperglycemia: Secondary | ICD-10-CM | POA: Diagnosis not present

## 2012-12-25 DIAGNOSIS — E118 Type 2 diabetes mellitus with unspecified complications: Secondary | ICD-10-CM

## 2012-12-25 DIAGNOSIS — L02419 Cutaneous abscess of limb, unspecified: Secondary | ICD-10-CM | POA: Diagnosis not present

## 2012-12-25 DIAGNOSIS — L02512 Cutaneous abscess of left hand: Secondary | ICD-10-CM

## 2012-12-25 DIAGNOSIS — E119 Type 2 diabetes mellitus without complications: Secondary | ICD-10-CM | POA: Diagnosis not present

## 2012-12-25 NOTE — Assessment & Plan Note (Signed)
He is doing better, has undergone revisions to his wounds. Will plan to continue his anbx to total 42 days (20 more days).  Will see him back in 3 weeks.

## 2012-12-25 NOTE — Assessment & Plan Note (Signed)
He is getting care for this at [facility] and at [facility]. This will be key to his healing.

## 2012-12-25 NOTE — Progress Notes (Signed)
  Subjective:    Patient ID: Stephen Fitzgerald, male    DOB: Jun 09, 1961, 52 y.o.   MRN: [id number]  HPI 52 yo M with CVA 1999 (with some expressive aphasia),previously untreated DM who developed swelling of his L hand so that skin overlapped his rings. This occurred for at least 2 months PTA. He was adm on 5-20 with Temp 100.7, foul smelling pus was draining from the ring sites, and felt to be septic. He was taken to OR on 5-22 and had had I & D of L hand, removal of rings from digits 2-4. 12-06-12 he underwent repeat I & D, removal of 2nd/index finger ray and use of this to create flap for 3rd finger.  Cx from 5-20 was polymicrobial, 5-21 P mirabilis. He was d/c to SNF on 5-29. He has since had further revision of his L hand wounds.  Was d/c home on levaquin. He has not seen his wounds since d/c from hospital. Per his dad- they look great, healing well.  FSG have been checked at [facility] where he is staying. States they have been fine.   Still has some erythema on his back from his previous ADR. No proximal erythema on his L arm. No fever or chills.  No diarrhea.    Today is day 22 of anbx.   Review of Systems See HPI.     Objective:   Physical Exam  Constitutional: He appears well-developed and well-nourished.  HENT:  Mouth/Throat: No oropharyngeal exudate.  Eyes: EOM are normal. Pupils are equal, round, and reactive to light.  Neck: Neck supple.  Cardiovascular: Normal rate, regular rhythm and normal heart sounds.   Pulmonary/Chest: Effort normal and breath sounds normal.  Abdominal: Soft. Bowel sounds are normal.  Musculoskeletal:       Arms: Lymphadenopathy:    He has no cervical adenopathy.  Skin:             Assessment & Plan:

## 2013-01-06 DIAGNOSIS — E11621 Type 2 diabetes mellitus with foot ulcer: Secondary | ICD-10-CM | POA: Insufficient documentation

## 2013-01-06 DIAGNOSIS — E1152 Type 2 diabetes mellitus with diabetic peripheral angiopathy with gangrene: Secondary | ICD-10-CM | POA: Insufficient documentation

## 2013-01-07 ENCOUNTER — Encounter: Payer: Self-pay | Admitting: Family Medicine

## 2013-01-07 ENCOUNTER — Ambulatory Visit (HOSPITAL_COMMUNITY)
Admission: RE | Admit: 2013-01-07 | Discharge: 2013-01-07 | Disposition: A | Discharge status: Home / Self Care | Payer: Medicare Other | Source: Ambulatory Visit | Attending: Family Medicine | Admitting: Family Medicine

## 2013-01-07 ENCOUNTER — Ambulatory Visit (INDEPENDENT_AMBULATORY_CARE_PROVIDER_SITE_OTHER): Payer: Medicare Other | Admitting: Family Medicine

## 2013-01-07 VITALS — BP 119/76 | HR 120 | Ht 72.0 in | Wt 174.0 lb

## 2013-01-07 DIAGNOSIS — E118 Type 2 diabetes mellitus with unspecified complications: Secondary | ICD-10-CM | POA: Diagnosis not present

## 2013-01-07 DIAGNOSIS — R002 Palpitations: Secondary | ICD-10-CM | POA: Insufficient documentation

## 2013-01-07 DIAGNOSIS — E1165 Type 2 diabetes mellitus with hyperglycemia: Secondary | ICD-10-CM | POA: Diagnosis not present

## 2013-01-07 DIAGNOSIS — E119 Type 2 diabetes mellitus without complications: Secondary | ICD-10-CM | POA: Diagnosis not present

## 2013-01-07 DIAGNOSIS — R000 Tachycardia, unspecified: Secondary | ICD-10-CM | POA: Diagnosis not present

## 2013-01-07 DIAGNOSIS — I69920 Aphasia following unspecified cerebrovascular disease: Secondary | ICD-10-CM | POA: Diagnosis not present

## 2013-01-07 DIAGNOSIS — I10 Essential (primary) hypertension: Secondary | ICD-10-CM | POA: Diagnosis not present

## 2013-01-07 DIAGNOSIS — Z4889 Encounter for other specified surgical aftercare: Secondary | ICD-10-CM | POA: Diagnosis not present

## 2013-01-07 DIAGNOSIS — IMO0002 Reserved for concepts with insufficient information to code with codable children: Secondary | ICD-10-CM

## 2013-01-07 MED ORDER — METFORMIN HCL 500 MG PO TABS: ORAL_TABLET | ORAL | Status: DC

## 2013-01-07 NOTE — Progress Notes (Signed)
Name: Stephen Fitzgerald Age/Sex: 52 y.o. male  HPI:  Pt presents today for hospital follow up and to establish general primary care. Of note, hx is limited by pt's aphasia post-stroke. His father accompanies him to the visit today and helps provide much of the history. Pt previously saw Dr. Wylene Simmer at [facility] but has not been seen in several years per father.  Hand infection: has seen Dr. Janee Morn and had additional surgeries per EMR. Pt reports he sees Dr. Janee Morn again tomorrow. He went to SNF upon discharge and just left SNF today. Is now living in his apartment. Is set up with home health PT, SW, and RN. Feels well overall. The rash he had on his back from the hospital is better. Denies any fevers.   Tachycardia: noted in hospital, and again today. Denies falls/syncope, chest pain, shortness of breath. Is eating and drinking well. No hx of atrial fibrillation. Denies palpitation. Does have a certain feeling he gets in his chest when he is "happy" that improves with stretching. No pain anywhere with ambulation. Does say he is somewhat nervous today.  Diabetes: did not like having his sugars checked in hospital and at [facility]. Is not checking them now. Currently taking glimepiride twice daily (dosing different AM and PM). Does give a history of possible lightheadedness, which he noticed especially when working out on bicycle at [facility]. Unable to provide a lot more history regarding the lightheadedness.  ROS: See HPI  PMFSH: Has adult children, divorced.   PHYSICAL EXAM: BP 119/76  Pulse 120  Ht 6' (1.829 m)  Wt 174 lb (78.926 kg)  BMI 23.59 kg/m2 Gen: NAD, pleasant and cooperative Heart: tachycardic but regular rhythm Lungs: CTAB, NWOB Neuro: speech somewhat aphasic with word finding difficulties and some word substitutions. At baseline per previous exams in hospital. Ambulates without difficulty. Otherwise grossly nonfocal. Ext: left hand bandaged with post-op bandage.  Surgical absence of pointer finger. Diminished sensation on tip of left middle finger. Brisk capillary refill on all fingers.  ASSESSMENT/PLAN:  # Health maintenance:  -Need to address colonoscopy and other health maintenance items at future visit.  # Establishing care: -Will have nursing staff contact [facility] for medical records Erskine Squibb at [phone]). Also contact Raynelle Chary for medical records from [facility]. May have to wait until f/u visit to obtain these records as did not have pt sign release form today.

## 2013-01-07 NOTE — Patient Instructions (Addendum)
It was great to see you again today!  For your diabetes: - Stop taking the glipizide - Start taking the metformin. Do one pill twice a day for one week, and then increase to two pills twice a day. - I recommend you get an eye exam. Call your eye doctor to schedule that appointment.  We are checking your thyroid today to see if it is causing your heart to beat fast. I will call you if your test results are not normal.  Otherwise, I will send you a letter.  If you do not hear from me with in 2 weeks please call our office.     You can call if you have any questions or concerns. Schedule an appointment to see me back in one month.  Be well, Dr. Pollie Meyer

## 2013-01-08 ENCOUNTER — Encounter: Payer: Self-pay | Admitting: Family Medicine

## 2013-01-09 DIAGNOSIS — M009 Pyogenic arthritis, unspecified: Secondary | ICD-10-CM | POA: Diagnosis not present

## 2013-01-09 DIAGNOSIS — L02519 Cutaneous abscess of unspecified hand: Secondary | ICD-10-CM | POA: Diagnosis not present

## 2013-01-13 DIAGNOSIS — M009 Pyogenic arthritis, unspecified: Secondary | ICD-10-CM | POA: Diagnosis not present

## 2013-01-13 DIAGNOSIS — L02519 Cutaneous abscess of unspecified hand: Secondary | ICD-10-CM | POA: Diagnosis not present

## 2013-01-13 DIAGNOSIS — L03119 Cellulitis of unspecified part of limb: Secondary | ICD-10-CM | POA: Diagnosis not present

## 2013-01-13 DIAGNOSIS — R000 Tachycardia, unspecified: Secondary | ICD-10-CM | POA: Insufficient documentation

## 2013-01-13 NOTE — Assessment & Plan Note (Signed)
Last A1c 9.0 on 5/21 in hospital. Currently on glipizide, but it seems pt does have some symptoms which are concerning for possible hypoglycemia. 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 Discussed concerns with pt and his father. Will d/c glipizide today and switch to metformin. Needs recheck of A1c in August. F/u in 1 month to see how he is tolerating metformin. Recommended pt go see eye doctor as well.

## 2013-01-13 NOTE — Assessment & Plan Note (Deleted)
Last A1c 9.0 on 5/21 in hospital. Currently on glipizide, but it seems pt does have some symptoms which are concerning for possible hypoglycemia. 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 Discussed concerns with pt and his father. Will d/c glipizide today and switch to metformin. Needs recheck of A1c in August. F/u in 1 month to see how he is tolerating metformin. Recommended pt go see eye doctor as well.  

## 2013-01-13 NOTE — Assessment & Plan Note (Signed)
Unclear etiology. EKG shows sinus tachycardia today. Pt seems overall asymptomatic. Will check TSH today. Will recheck pulse & BP in one month and consider increasing beta blocker at that time. Precepted with Dr. Deirdre Priest.

## 2013-01-16 DIAGNOSIS — L03119 Cellulitis of unspecified part of limb: Secondary | ICD-10-CM | POA: Diagnosis not present

## 2013-01-16 DIAGNOSIS — M009 Pyogenic arthritis, unspecified: Secondary | ICD-10-CM | POA: Diagnosis not present

## 2013-01-16 LAB — AFB CULTURE WITH SMEAR (NOT AT ARMC): Acid Fast Smear: NONE SEEN

## 2013-01-20 DIAGNOSIS — L02519 Cutaneous abscess of unspecified hand: Secondary | ICD-10-CM | POA: Diagnosis not present

## 2013-01-20 DIAGNOSIS — M009 Pyogenic arthritis, unspecified: Secondary | ICD-10-CM | POA: Diagnosis not present

## 2013-01-22 ENCOUNTER — Encounter: Payer: Self-pay | Admitting: Infectious Diseases

## 2013-01-22 ENCOUNTER — Ambulatory Visit (INDEPENDENT_AMBULATORY_CARE_PROVIDER_SITE_OTHER): Payer: Medicare Other | Admitting: Infectious Diseases

## 2013-01-22 VITALS — BP 101/70 | HR 102 | Temp 98.2°F | Ht 72.0 in | Wt 179.0 lb

## 2013-01-22 DIAGNOSIS — M009 Pyogenic arthritis, unspecified: Secondary | ICD-10-CM | POA: Diagnosis not present

## 2013-01-22 DIAGNOSIS — E1165 Type 2 diabetes mellitus with hyperglycemia: Secondary | ICD-10-CM | POA: Diagnosis not present

## 2013-01-22 DIAGNOSIS — L02512 Cutaneous abscess of left hand: Secondary | ICD-10-CM

## 2013-01-22 DIAGNOSIS — E118 Type 2 diabetes mellitus with unspecified complications: Secondary | ICD-10-CM

## 2013-01-22 DIAGNOSIS — L03119 Cellulitis of unspecified part of limb: Secondary | ICD-10-CM | POA: Diagnosis not present

## 2013-01-22 DIAGNOSIS — L02519 Cutaneous abscess of unspecified hand: Secondary | ICD-10-CM | POA: Diagnosis not present

## 2013-01-22 NOTE — Assessment & Plan Note (Signed)
He is significantly improved. Will see him back on a prn basis. I suggested that he could stop his anbx but he wants to take the last 2 doses.

## 2013-01-22 NOTE — Assessment & Plan Note (Signed)
My great appreciation to family medicine service. His diabetec care/control will be key.

## 2013-01-22 NOTE — Progress Notes (Signed)
  Subjective:    Patient ID: Stephen Fitzgerald, male    DOB: [birth year], 52 y.o.   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 This occurred for at least 2 months PTA. He was adm on 5-20 with Temp 100.7, foul smelling pus was draining from the ring sites, and felt to be septic. He was taken to OR on 5-22 and had had I & D of L hand, removal of rings from digits 2-4. 12-06-12 he underwent repeat I & D, removal of 2nd/index finger ray and use of this to create flap for 3rd finger.  Cx from 5-20 was polymicrobial, 5-21 P mirabilis. He was d/c to SNF on 5-29. He was d/c on levaquin with the plan to complete his anbx on 01-14-13 (42 days). He has continued to take this and has 2 doses left.  Has lost 70 #. Is getting care from hand surgery office.  Has improved ROM of his L hand. Is not checking his FSG. He goes to rehab center [facility]. He does not want a home RN, states his dad is filling his pills.  No d/c from his hand wound. Per his dad his swelling in his hand has improved.     Review of Systems     Objective:   Physical Exam  Constitutional: He appears well-developed and well-nourished.  Musculoskeletal:       Arms:         Assessment & Plan:

## 2013-01-24 DIAGNOSIS — L03119 Cellulitis of unspecified part of limb: Secondary | ICD-10-CM | POA: Diagnosis not present

## 2013-01-24 DIAGNOSIS — M009 Pyogenic arthritis, unspecified: Secondary | ICD-10-CM | POA: Diagnosis not present

## 2013-01-24 DIAGNOSIS — L02519 Cutaneous abscess of unspecified hand: Secondary | ICD-10-CM | POA: Diagnosis not present

## 2013-01-27 DIAGNOSIS — L03119 Cellulitis of unspecified part of limb: Secondary | ICD-10-CM | POA: Diagnosis not present

## 2013-01-27 DIAGNOSIS — M009 Pyogenic arthritis, unspecified: Secondary | ICD-10-CM | POA: Diagnosis not present

## 2013-01-29 DIAGNOSIS — L03119 Cellulitis of unspecified part of limb: Secondary | ICD-10-CM | POA: Diagnosis not present

## 2013-01-29 DIAGNOSIS — M009 Pyogenic arthritis, unspecified: Secondary | ICD-10-CM | POA: Diagnosis not present

## 2013-01-31 DIAGNOSIS — M009 Pyogenic arthritis, unspecified: Secondary | ICD-10-CM | POA: Diagnosis not present

## 2013-01-31 DIAGNOSIS — L02519 Cutaneous abscess of unspecified hand: Secondary | ICD-10-CM | POA: Diagnosis not present

## 2013-02-05 DIAGNOSIS — L02519 Cutaneous abscess of unspecified hand: Secondary | ICD-10-CM | POA: Diagnosis not present

## 2013-02-05 DIAGNOSIS — M009 Pyogenic arthritis, unspecified: Secondary | ICD-10-CM | POA: Diagnosis not present

## 2013-02-05 DIAGNOSIS — L03119 Cellulitis of unspecified part of limb: Secondary | ICD-10-CM | POA: Diagnosis not present

## 2013-02-06 ENCOUNTER — Encounter: Payer: Self-pay | Admitting: Family Medicine

## 2013-02-06 ENCOUNTER — Ambulatory Visit (INDEPENDENT_AMBULATORY_CARE_PROVIDER_SITE_OTHER): Payer: Medicare Other | Admitting: Family Medicine

## 2013-02-06 VITALS — BP 117/80 | HR 109 | Ht 72.0 in | Wt 175.4 lb

## 2013-02-06 DIAGNOSIS — E1165 Type 2 diabetes mellitus with hyperglycemia: Secondary | ICD-10-CM | POA: Diagnosis not present

## 2013-02-06 DIAGNOSIS — E118 Type 2 diabetes mellitus with unspecified complications: Secondary | ICD-10-CM

## 2013-02-06 MED ORDER — METFORMIN HCL ER 500 MG PO TB24: 1000.0000 mg | ORAL_TABLET | Freq: Every day | ORAL | Status: DC

## 2013-02-06 NOTE — Progress Notes (Signed)
Patient ID: Stephen Fitzgerald, male   DOB: [birth year], 52 y.o.   MRN: [id number]    HPI:  Diabetes: at last appointment we started metformin and stopped his glipizide due to concerns about lightheadedness. He is taking metformin 1000mg  BID. Initially had some GI upset/diarrhea but says that's better now. Pt does report that he has occasional constipation & occasional diarrhea. Occasionally has abdominal pain as if he's going to have diarrhea, but then has solid BM. Primarily has these problems at night. Denies chest pain, shortness of breath, fevers.  ROS: See HPI  PMFSH: recent hand surgery for infected imbedded rings. Is doing hand PT at Dr. Carollee Massed office 3 times per week  PHYSICAL EXAM: BP 117/80  Pulse 109  Ht 6' (1.829 m)  Wt 175 lb 6.4 oz (79.561 kg)  BMI 23.78 kg/m2 Gen: NAD Heart: mildly tachycardic, regular rhythm Lungs: CTAB Abd: voluntary distension of abdominal muscles with palpation but abdomen is nontender Neuro: at pt's baseline (some speech word finding difficulties)

## 2013-02-06 NOTE — Patient Instructions (Addendum)
It was great to see you again today!  Come back in one month to repeat your hemoglobin A1c testing for diabetes. I sent in a prescription for an extended release version of metformin, which you can take just once a day in the morning. Let me know if your insurance won't cover it, and we can try sending it to [facility] where it should be free.  Call if you have any problems.  Be well, Dr. Pollie Meyer

## 2013-02-07 DIAGNOSIS — M009 Pyogenic arthritis, unspecified: Secondary | ICD-10-CM | POA: Diagnosis not present

## 2013-02-07 DIAGNOSIS — L03119 Cellulitis of unspecified part of limb: Secondary | ICD-10-CM | POA: Diagnosis not present

## 2013-02-10 DIAGNOSIS — L03119 Cellulitis of unspecified part of limb: Secondary | ICD-10-CM | POA: Diagnosis not present

## 2013-02-10 DIAGNOSIS — M009 Pyogenic arthritis, unspecified: Secondary | ICD-10-CM | POA: Diagnosis not present

## 2013-02-12 DIAGNOSIS — L02519 Cutaneous abscess of unspecified hand: Secondary | ICD-10-CM | POA: Diagnosis not present

## 2013-02-12 DIAGNOSIS — L03119 Cellulitis of unspecified part of limb: Secondary | ICD-10-CM | POA: Diagnosis not present

## 2013-02-14 DIAGNOSIS — M009 Pyogenic arthritis, unspecified: Secondary | ICD-10-CM | POA: Diagnosis not present

## 2013-02-14 DIAGNOSIS — L02519 Cutaneous abscess of unspecified hand: Secondary | ICD-10-CM | POA: Diagnosis not present

## 2013-02-15 NOTE — Assessment & Plan Note (Addendum)
Reports GI problems with metformin, primarily at night. Will switch from twice daily dosing to metformin XR 1000mg  and dose this once daily in the morning, to see if he has any relief from these symptoms. Pt's dad had some concerns about insurance not covering this form of metformin, so advised that if he has trouble getting it filled to call and let me know. It appears to be on the discount list at [facility] so we can send it there if need be. F/u in 1 month for A1c.

## 2013-02-17 DIAGNOSIS — L03119 Cellulitis of unspecified part of limb: Secondary | ICD-10-CM | POA: Diagnosis not present

## 2013-02-17 DIAGNOSIS — M009 Pyogenic arthritis, unspecified: Secondary | ICD-10-CM | POA: Diagnosis not present

## 2013-02-19 DIAGNOSIS — M009 Pyogenic arthritis, unspecified: Secondary | ICD-10-CM | POA: Diagnosis not present

## 2013-02-19 DIAGNOSIS — L02519 Cutaneous abscess of unspecified hand: Secondary | ICD-10-CM | POA: Diagnosis not present

## 2013-02-21 DIAGNOSIS — M009 Pyogenic arthritis, unspecified: Secondary | ICD-10-CM | POA: Diagnosis not present

## 2013-02-21 DIAGNOSIS — L02519 Cutaneous abscess of unspecified hand: Secondary | ICD-10-CM | POA: Diagnosis not present

## 2013-02-24 DIAGNOSIS — L02519 Cutaneous abscess of unspecified hand: Secondary | ICD-10-CM | POA: Diagnosis not present

## 2013-02-24 DIAGNOSIS — E119 Type 2 diabetes mellitus without complications: Secondary | ICD-10-CM | POA: Diagnosis not present

## 2013-02-24 DIAGNOSIS — Z961 Presence of intraocular lens: Secondary | ICD-10-CM | POA: Diagnosis not present

## 2013-02-24 DIAGNOSIS — M009 Pyogenic arthritis, unspecified: Secondary | ICD-10-CM | POA: Diagnosis not present

## 2013-02-26 DIAGNOSIS — L02519 Cutaneous abscess of unspecified hand: Secondary | ICD-10-CM | POA: Diagnosis not present

## 2013-02-26 DIAGNOSIS — M009 Pyogenic arthritis, unspecified: Secondary | ICD-10-CM | POA: Diagnosis not present

## 2013-02-28 DIAGNOSIS — L02519 Cutaneous abscess of unspecified hand: Secondary | ICD-10-CM | POA: Diagnosis not present

## 2013-02-28 DIAGNOSIS — L03119 Cellulitis of unspecified part of limb: Secondary | ICD-10-CM | POA: Diagnosis not present

## 2013-02-28 DIAGNOSIS — M009 Pyogenic arthritis, unspecified: Secondary | ICD-10-CM | POA: Diagnosis not present

## 2013-03-03 DIAGNOSIS — M009 Pyogenic arthritis, unspecified: Secondary | ICD-10-CM | POA: Diagnosis not present

## 2013-03-03 DIAGNOSIS — L02519 Cutaneous abscess of unspecified hand: Secondary | ICD-10-CM | POA: Diagnosis not present

## 2013-03-05 DIAGNOSIS — L02519 Cutaneous abscess of unspecified hand: Secondary | ICD-10-CM | POA: Diagnosis not present

## 2013-03-05 DIAGNOSIS — M009 Pyogenic arthritis, unspecified: Secondary | ICD-10-CM | POA: Diagnosis not present

## 2013-03-06 ENCOUNTER — Encounter: Payer: Self-pay | Admitting: Family Medicine

## 2013-03-06 ENCOUNTER — Ambulatory Visit (INDEPENDENT_AMBULATORY_CARE_PROVIDER_SITE_OTHER): Payer: Medicare Other | Admitting: Family Medicine

## 2013-03-06 VITALS — BP 129/75 | HR 103 | Ht 72.0 in | Wt 177.2 lb

## 2013-03-06 DIAGNOSIS — E119 Type 2 diabetes mellitus without complications: Secondary | ICD-10-CM

## 2013-03-06 DIAGNOSIS — E1165 Type 2 diabetes mellitus with hyperglycemia: Secondary | ICD-10-CM | POA: Diagnosis not present

## 2013-03-06 DIAGNOSIS — I10 Essential (primary) hypertension: Secondary | ICD-10-CM | POA: Insufficient documentation

## 2013-03-06 DIAGNOSIS — E785 Hyperlipidemia, unspecified: Secondary | ICD-10-CM | POA: Insufficient documentation

## 2013-03-06 MED ORDER — METFORMIN HCL ER 500 MG PO TB24: 1000.0000 mg | ORAL_TABLET | Freq: Every day | ORAL | Status: DC

## 2013-03-06 MED ORDER — METOPROLOL TARTRATE 25 MG PO TABS: 25.0000 mg | ORAL_TABLET | Freq: Two times a day (BID) | ORAL | Status: DC

## 2013-03-06 MED ORDER — ATORVASTATIN CALCIUM 40 MG PO TABS: 40.0000 mg | ORAL_TABLET | Freq: Every day | ORAL | Status: DC

## 2013-03-06 MED ORDER — GLIPIZIDE 5 MG PO TABS: 2.5000 mg | ORAL_TABLET | Freq: Two times a day (BID) | ORAL | Status: DC

## 2013-03-06 NOTE — Assessment & Plan Note (Addendum)
A1c today is 9.5, which is actually elevated compared to previous A1c in the hospital 3 months ago. Will continue metformin XR 1000mg  daily, and add glipizide 2.5mg  BID. Had previously been on 5mg  dosing, but we stopped this because I was concerned that he was getting lightheaded. Will add back small dose (2.5mg  BID). Pt and father counseled on signs of hypoglycemia. F/u in 3 months for repeat A1c.  Cardiac: reccomended aspirin 81mg  daily, increasing statin to lipitor 40mg  daily - rx written Renal: check urine micro today Eye: recent normal eye exam Foot: monofilament testing done today, normal. Does have diminished R DP pulse but no symptoms of claudication.

## 2013-03-06 NOTE — Assessment & Plan Note (Signed)
Given hx of CVA and current diabetes, discussed switching to higher potency statin today. Pt willing. Will rx lipitor 40mg  daily, d/c simvastatin.

## 2013-03-06 NOTE — Patient Instructions (Addendum)
It was great to see you again today!  I sent in refills on the metformin, metoprolol, and sent in a new prescription for lipitor. STOP taking the simvastatin. I recommend you take a baby aspirin (81mg ) every day.  I also sent in a prescription for glipizide. Take 1/2 tablet in the morning and at night. If you have any dizziness, lightheadedness, or other signs of low blood sugar, eat something sweet and call our office.  Come back in 3 months for another visit.  Be well, Dr. Pollie Meyer

## 2013-03-06 NOTE — Progress Notes (Signed)
Patient ID: Stephen Fitzgerald, male   DOB: 03-27-1961, 52 y.o.   MRN: [id number]   HPI:  Diabetes: Denies CP, SOB, dizziness, lightheadedness. Is currently taking metformin XR 1000mg  every morning. Has no problems, states is feeling well. Does not check blood sugars (is very averse to needles). States is just taking aspirin as needed for pain, not every day.  Hyperlipidemia: See ROS under diabetes. Currently taking simvastatin 40mg  daily. Willing to switch to lipitor if indicated.  Hypertension: See ROS under diabetes. Currently taking metoprolol 25mg  BID.  ROS: See HPI. Denies pain in legs with ambulation.  PMFSH: hx stroke in the past  PHYSICAL EXAM: BP 129/75  Pulse 103  Ht 6' (1.829 m)  Wt 177 lb 3.2 oz (80.377 kg)  BMI 24.03 kg/m2 Gen: NAD Heart: mildly tachycardic, regular rhythm Lungs: CTAB, NWOB Neuro: at patient's baseline (some word finding difficulties) Ext: DP pulse on R is faint, but extremity is otherwise well perfused. L DP 2+. Normal monofilament testing on both feet.  ASSESSMENT/PLAN:  # Health maintenance:  -discussed colonoscopy with patient today, pt & father prefer to wait until he is done rehabbing his hand, which I think is reasonable. Will re-address at future office visit. -need to address pneumonia vaccine at next visit as well

## 2013-03-06 NOTE — Assessment & Plan Note (Signed)
Well controlled  - Continue current management

## 2013-03-07 DIAGNOSIS — M009 Pyogenic arthritis, unspecified: Secondary | ICD-10-CM | POA: Diagnosis not present

## 2013-03-07 DIAGNOSIS — L02519 Cutaneous abscess of unspecified hand: Secondary | ICD-10-CM | POA: Diagnosis not present

## 2013-03-07 LAB — MICROALBUMIN / CREATININE URINE RATIO
Creatinine, Urine: 51.2 mg/dL
Microalb Creat Ratio: 9.8 mg/g (ref 0.0–30.0)
Microalb, Ur: 0.5 mg/dL (ref 0.00–1.89)

## 2013-03-11 DIAGNOSIS — M009 Pyogenic arthritis, unspecified: Secondary | ICD-10-CM | POA: Diagnosis not present

## 2013-03-11 DIAGNOSIS — L02519 Cutaneous abscess of unspecified hand: Secondary | ICD-10-CM | POA: Diagnosis not present

## 2013-03-13 DIAGNOSIS — L02519 Cutaneous abscess of unspecified hand: Secondary | ICD-10-CM | POA: Diagnosis not present

## 2013-03-18 DIAGNOSIS — S68118A Complete traumatic metacarpophalangeal amputation of other finger, initial encounter: Secondary | ICD-10-CM | POA: Diagnosis not present

## 2013-03-18 DIAGNOSIS — L02519 Cutaneous abscess of unspecified hand: Secondary | ICD-10-CM | POA: Diagnosis not present

## 2013-03-18 DIAGNOSIS — M009 Pyogenic arthritis, unspecified: Secondary | ICD-10-CM | POA: Diagnosis not present

## 2013-03-20 DIAGNOSIS — S68118A Complete traumatic metacarpophalangeal amputation of other finger, initial encounter: Secondary | ICD-10-CM | POA: Diagnosis not present

## 2013-03-20 DIAGNOSIS — M009 Pyogenic arthritis, unspecified: Secondary | ICD-10-CM | POA: Diagnosis not present

## 2013-03-20 DIAGNOSIS — L02519 Cutaneous abscess of unspecified hand: Secondary | ICD-10-CM | POA: Diagnosis not present

## 2013-03-25 DIAGNOSIS — S68118A Complete traumatic metacarpophalangeal amputation of other finger, initial encounter: Secondary | ICD-10-CM | POA: Diagnosis not present

## 2013-03-25 DIAGNOSIS — L02519 Cutaneous abscess of unspecified hand: Secondary | ICD-10-CM | POA: Diagnosis not present

## 2013-03-25 DIAGNOSIS — M009 Pyogenic arthritis, unspecified: Secondary | ICD-10-CM | POA: Diagnosis not present

## 2013-03-27 DIAGNOSIS — L02519 Cutaneous abscess of unspecified hand: Secondary | ICD-10-CM | POA: Diagnosis not present

## 2013-03-27 DIAGNOSIS — S68118A Complete traumatic metacarpophalangeal amputation of other finger, initial encounter: Secondary | ICD-10-CM | POA: Diagnosis not present

## 2013-03-27 DIAGNOSIS — M009 Pyogenic arthritis, unspecified: Secondary | ICD-10-CM | POA: Diagnosis not present

## 2013-04-01 DIAGNOSIS — L02519 Cutaneous abscess of unspecified hand: Secondary | ICD-10-CM | POA: Diagnosis not present

## 2013-04-01 DIAGNOSIS — S68118A Complete traumatic metacarpophalangeal amputation of other finger, initial encounter: Secondary | ICD-10-CM | POA: Diagnosis not present

## 2013-04-01 DIAGNOSIS — M009 Pyogenic arthritis, unspecified: Secondary | ICD-10-CM | POA: Diagnosis not present

## 2013-04-03 DIAGNOSIS — S68118A Complete traumatic metacarpophalangeal amputation of other finger, initial encounter: Secondary | ICD-10-CM | POA: Diagnosis not present

## 2013-04-03 DIAGNOSIS — L02519 Cutaneous abscess of unspecified hand: Secondary | ICD-10-CM | POA: Diagnosis not present

## 2013-04-03 DIAGNOSIS — M009 Pyogenic arthritis, unspecified: Secondary | ICD-10-CM | POA: Diagnosis not present

## 2013-04-07 DIAGNOSIS — S68118A Complete traumatic metacarpophalangeal amputation of other finger, initial encounter: Secondary | ICD-10-CM | POA: Diagnosis not present

## 2013-04-07 DIAGNOSIS — L02519 Cutaneous abscess of unspecified hand: Secondary | ICD-10-CM | POA: Diagnosis not present

## 2013-04-07 DIAGNOSIS — M009 Pyogenic arthritis, unspecified: Secondary | ICD-10-CM | POA: Diagnosis not present

## 2013-04-09 DIAGNOSIS — M009 Pyogenic arthritis, unspecified: Secondary | ICD-10-CM | POA: Diagnosis not present

## 2013-04-09 DIAGNOSIS — L02519 Cutaneous abscess of unspecified hand: Secondary | ICD-10-CM | POA: Diagnosis not present

## 2013-04-09 DIAGNOSIS — S68118A Complete traumatic metacarpophalangeal amputation of other finger, initial encounter: Secondary | ICD-10-CM | POA: Diagnosis not present

## 2013-04-15 DIAGNOSIS — M009 Pyogenic arthritis, unspecified: Secondary | ICD-10-CM | POA: Diagnosis not present

## 2013-04-15 DIAGNOSIS — S68118A Complete traumatic metacarpophalangeal amputation of other finger, initial encounter: Secondary | ICD-10-CM | POA: Diagnosis not present

## 2013-04-15 DIAGNOSIS — L02519 Cutaneous abscess of unspecified hand: Secondary | ICD-10-CM | POA: Diagnosis not present

## 2013-04-18 DIAGNOSIS — M009 Pyogenic arthritis, unspecified: Secondary | ICD-10-CM | POA: Diagnosis not present

## 2013-04-18 DIAGNOSIS — L02519 Cutaneous abscess of unspecified hand: Secondary | ICD-10-CM | POA: Diagnosis not present

## 2013-04-18 DIAGNOSIS — S68118A Complete traumatic metacarpophalangeal amputation of other finger, initial encounter: Secondary | ICD-10-CM | POA: Diagnosis not present

## 2013-04-22 DIAGNOSIS — L02519 Cutaneous abscess of unspecified hand: Secondary | ICD-10-CM | POA: Diagnosis not present

## 2013-04-22 DIAGNOSIS — S68118A Complete traumatic metacarpophalangeal amputation of other finger, initial encounter: Secondary | ICD-10-CM | POA: Diagnosis not present

## 2013-04-22 DIAGNOSIS — M009 Pyogenic arthritis, unspecified: Secondary | ICD-10-CM | POA: Diagnosis not present

## 2013-04-23 DIAGNOSIS — L02519 Cutaneous abscess of unspecified hand: Secondary | ICD-10-CM | POA: Diagnosis not present

## 2013-04-24 DIAGNOSIS — S68118A Complete traumatic metacarpophalangeal amputation of other finger, initial encounter: Secondary | ICD-10-CM | POA: Diagnosis not present

## 2013-04-24 DIAGNOSIS — L02519 Cutaneous abscess of unspecified hand: Secondary | ICD-10-CM | POA: Diagnosis not present

## 2013-04-24 DIAGNOSIS — M009 Pyogenic arthritis, unspecified: Secondary | ICD-10-CM | POA: Diagnosis not present

## 2013-04-29 DIAGNOSIS — M009 Pyogenic arthritis, unspecified: Secondary | ICD-10-CM | POA: Diagnosis not present

## 2013-04-29 DIAGNOSIS — S68118A Complete traumatic metacarpophalangeal amputation of other finger, initial encounter: Secondary | ICD-10-CM | POA: Diagnosis not present

## 2013-04-29 DIAGNOSIS — L02519 Cutaneous abscess of unspecified hand: Secondary | ICD-10-CM | POA: Diagnosis not present

## 2013-05-01 DIAGNOSIS — M009 Pyogenic arthritis, unspecified: Secondary | ICD-10-CM | POA: Diagnosis not present

## 2013-05-01 DIAGNOSIS — L02519 Cutaneous abscess of unspecified hand: Secondary | ICD-10-CM | POA: Diagnosis not present

## 2013-05-01 DIAGNOSIS — S68118A Complete traumatic metacarpophalangeal amputation of other finger, initial encounter: Secondary | ICD-10-CM | POA: Diagnosis not present

## 2013-05-06 DIAGNOSIS — L02519 Cutaneous abscess of unspecified hand: Secondary | ICD-10-CM | POA: Diagnosis not present

## 2013-05-06 DIAGNOSIS — S68118A Complete traumatic metacarpophalangeal amputation of other finger, initial encounter: Secondary | ICD-10-CM | POA: Diagnosis not present

## 2013-05-06 DIAGNOSIS — M009 Pyogenic arthritis, unspecified: Secondary | ICD-10-CM | POA: Diagnosis not present

## 2013-05-09 DIAGNOSIS — S68118A Complete traumatic metacarpophalangeal amputation of other finger, initial encounter: Secondary | ICD-10-CM | POA: Diagnosis not present

## 2013-05-09 DIAGNOSIS — L02519 Cutaneous abscess of unspecified hand: Secondary | ICD-10-CM | POA: Diagnosis not present

## 2013-05-09 DIAGNOSIS — M009 Pyogenic arthritis, unspecified: Secondary | ICD-10-CM | POA: Diagnosis not present

## 2013-05-13 DIAGNOSIS — S68118A Complete traumatic metacarpophalangeal amputation of other finger, initial encounter: Secondary | ICD-10-CM | POA: Diagnosis not present

## 2013-05-13 DIAGNOSIS — L02519 Cutaneous abscess of unspecified hand: Secondary | ICD-10-CM | POA: Diagnosis not present

## 2013-05-13 DIAGNOSIS — M009 Pyogenic arthritis, unspecified: Secondary | ICD-10-CM | POA: Diagnosis not present

## 2013-05-15 DIAGNOSIS — M009 Pyogenic arthritis, unspecified: Secondary | ICD-10-CM | POA: Diagnosis not present

## 2013-05-15 DIAGNOSIS — L02519 Cutaneous abscess of unspecified hand: Secondary | ICD-10-CM | POA: Diagnosis not present

## 2013-05-20 DIAGNOSIS — M009 Pyogenic arthritis, unspecified: Secondary | ICD-10-CM | POA: Diagnosis not present

## 2013-05-20 DIAGNOSIS — L02519 Cutaneous abscess of unspecified hand: Secondary | ICD-10-CM | POA: Diagnosis not present

## 2013-05-22 DIAGNOSIS — S68118A Complete traumatic metacarpophalangeal amputation of other finger, initial encounter: Secondary | ICD-10-CM | POA: Diagnosis not present

## 2013-05-22 DIAGNOSIS — M009 Pyogenic arthritis, unspecified: Secondary | ICD-10-CM | POA: Diagnosis not present

## 2013-05-22 DIAGNOSIS — L02519 Cutaneous abscess of unspecified hand: Secondary | ICD-10-CM | POA: Diagnosis not present

## 2013-06-05 ENCOUNTER — Other Ambulatory Visit: Payer: Self-pay | Admitting: Family Medicine

## 2013-06-10 ENCOUNTER — Telehealth: Payer: Self-pay | Admitting: Family Medicine

## 2013-06-10 NOTE — Telephone Encounter (Signed)
To [facility] red team - please call pt and let him know I have refilled one month's worth of his metformin but he needs to schedule an appointment to follow up on his diabetes. Thanks!

## 2013-06-11 NOTE — Telephone Encounter (Signed)
LMVM with message form MD below.  Stephen Fitzgerald, [name], CMA

## 2013-06-30 ENCOUNTER — Other Ambulatory Visit: Payer: Self-pay | Admitting: Family Medicine

## 2013-06-30 DIAGNOSIS — L02519 Cutaneous abscess of unspecified hand: Secondary | ICD-10-CM | POA: Diagnosis not present

## 2013-07-01 ENCOUNTER — Telehealth: Payer: Self-pay | Admitting: Family Medicine

## 2013-07-01 NOTE — Telephone Encounter (Signed)
To [facility] red team - please call pt and let him know I have refilled one month's worth of his medicines but he needs to schedule an appointment to follow up on his diabetes. It will be most helpful probably if you call his father, as his dad assists him with getting to medical appointments.  Thanks!  Latrelle Dodrill, MD

## 2013-07-02 NOTE — Telephone Encounter (Signed)
Spoke with pt's father, Genevie Cheshire.  Appt made 07/23/2013 to see Dr. Pollie Meyer.  Father agreeable.  Ianna Salmela, [name], CMA

## 2013-07-23 ENCOUNTER — Encounter: Payer: Self-pay | Admitting: Family Medicine

## 2013-07-23 ENCOUNTER — Ambulatory Visit (INDEPENDENT_AMBULATORY_CARE_PROVIDER_SITE_OTHER): Payer: Medicare Other | Admitting: Family Medicine

## 2013-07-23 VITALS — BP 137/86 | HR 99 | Temp 98.6°F | Ht 72.0 in | Wt 199.3 lb

## 2013-07-23 DIAGNOSIS — I10 Essential (primary) hypertension: Secondary | ICD-10-CM | POA: Diagnosis not present

## 2013-07-23 DIAGNOSIS — E118 Type 2 diabetes mellitus with unspecified complications: Principal | ICD-10-CM

## 2013-07-23 DIAGNOSIS — IMO0002 Reserved for concepts with insufficient information to code with codable children: Secondary | ICD-10-CM | POA: Diagnosis not present

## 2013-07-23 DIAGNOSIS — E1165 Type 2 diabetes mellitus with hyperglycemia: Secondary | ICD-10-CM | POA: Diagnosis not present

## 2013-07-23 DIAGNOSIS — E785 Hyperlipidemia, unspecified: Secondary | ICD-10-CM | POA: Diagnosis not present

## 2013-07-23 DIAGNOSIS — R011 Cardiac murmur, unspecified: Secondary | ICD-10-CM | POA: Diagnosis not present

## 2013-07-23 LAB — POCT GLYCOSYLATED HEMOGLOBIN (HGB A1C): Hemoglobin A1C: 5.8

## 2013-07-23 NOTE — Progress Notes (Signed)
Patient ID: Stephen SprinklesJeffrey W Fitzgerald, male   DOB: 1960/09/08, 53 y.o.   MRN: [id number]  HPI:  Diabetes: currently taking metformin XR 1000mg  daily and glipizide 2.5mg  BID with meals. Does not check CBG's at home. Denies chest pain, SOB, headache, vision changes, swelling in legs, dizziness, lightheadedness. Went to eye doctor some time in the fall and was told his eyes were doing okay. Tolerating his medicine well.  HTN: does not check BP at home. See above ROS. Currently taking metoprolol 25mg  BID.  HLD: currently taking lipitor 40mg  daily. Tolerating medicine well. See above ROS.  ROS: See HPI  PMFSH: hx stroke with resultant expressive aphasia  PHYSICAL EXAM: BP 137/86  Pulse 99  Temp(Src) 98.6 F (37 C) (Oral)  Ht 6' (1.829 m)  Wt 199 lb 4.8 oz (90.402 kg)  BMI 27.02 kg/m2 Gen: NAD HEENT: NCAT, mild chronic L facial droop present (residual from stroke) Heart: RRR, mild early 2/6 systolic murmur loudest at LUSB Lungs: CTAB Ext: No appreciable lower extremity edema bilaterally  Neuro: grossly nonfocal/at pt's baseline (L facial droop)  ASSESSMENT/PLAN:  # Health maintenance:  -Offered flu shot to patient today, but pt declined.  -Also tried to discuss colonoscopy but pt prefers to wait to have this done. -Pt declined pneumonia shot today  See problem based charting for additional assessment/plan.  FOLLOW UP: F/u in 3 months for chronic medical problems.  GrenadaBrittany J. Pollie MeyerMcIntyre, MD [facility]

## 2013-07-23 NOTE — Patient Instructions (Signed)
It was great to see you again today!  Let's keep all your medicines the same. Come back in 3 months to follow up on chronic medical problems.  Be well, Dr. Pollie MeyerMcIntyre

## 2013-07-24 DIAGNOSIS — R011 Cardiac murmur, unspecified: Secondary | ICD-10-CM | POA: Insufficient documentation

## 2013-07-24 NOTE — Assessment & Plan Note (Signed)
Noted on exam today. I have not previously heard this but pt has also been tachycardic in the past, potentially making it difficult to hear. Per pt and his father, he has had an echo and a heart cath before. I am not able to see those records. Pt is currently asymptomatic so will continue to just monitor with serial physical exams.

## 2013-07-24 NOTE — Assessment & Plan Note (Signed)
Tolerating statin. Will continue this and recheck lipids at future visit.

## 2013-07-24 NOTE — Assessment & Plan Note (Signed)
A1c greatly improved today at 5.8 with glipizide and metformin. Will continue these medications. F/u in 3 months.  Cardiac: on statin, aspirin Renal: had urine micro done in August 2014 which did not show any evidence of microalbuminuria, due again Aug 2015 Eye: recent normal eye exam per father Foot: normal monofilament test in August 2014, due for next foot exam in August 2015

## 2013-07-24 NOTE — Assessment & Plan Note (Signed)
BP well controlled, continue current regimen. 

## 2013-07-28 ENCOUNTER — Other Ambulatory Visit: Payer: Self-pay | Admitting: Family Medicine

## 2013-12-03 ENCOUNTER — Ambulatory Visit (INDEPENDENT_AMBULATORY_CARE_PROVIDER_SITE_OTHER): Payer: Medicare Other | Admitting: Family Medicine

## 2013-12-03 ENCOUNTER — Encounter: Payer: Self-pay | Admitting: Family Medicine

## 2013-12-03 VITALS — BP 167/98 | HR 109 | Temp 98.3°F | Ht 72.0 in | Wt 212.7 lb

## 2013-12-03 DIAGNOSIS — E1165 Type 2 diabetes mellitus with hyperglycemia: Secondary | ICD-10-CM | POA: Diagnosis not present

## 2013-12-03 DIAGNOSIS — I10 Essential (primary) hypertension: Secondary | ICD-10-CM

## 2013-12-03 DIAGNOSIS — R000 Tachycardia, unspecified: Secondary | ICD-10-CM

## 2013-12-03 DIAGNOSIS — R4701 Aphasia: Secondary | ICD-10-CM | POA: Diagnosis not present

## 2013-12-03 DIAGNOSIS — E118 Type 2 diabetes mellitus with unspecified complications: Principal | ICD-10-CM

## 2013-12-03 DIAGNOSIS — E785 Hyperlipidemia, unspecified: Secondary | ICD-10-CM

## 2013-12-03 DIAGNOSIS — IMO0002 Reserved for concepts with insufficient information to code with codable children: Secondary | ICD-10-CM

## 2013-12-03 LAB — POCT GLYCOSYLATED HEMOGLOBIN (HGB A1C): Hemoglobin A1C: 5.9

## 2013-12-03 MED ORDER — METOPROLOL SUCCINATE ER 50 MG PO TB24: 50.0000 mg | ORAL_TABLET | Freq: Every day | ORAL | Status: DC

## 2013-12-03 MED ORDER — METFORMIN HCL ER (OSM) 1000 MG PO TB24: 1000.0000 mg | ORAL_TABLET | Freq: Every day | ORAL | Status: DC

## 2013-12-03 MED ORDER — ATORVASTATIN CALCIUM 40 MG PO TABS: ORAL_TABLET | ORAL | Status: DC

## 2013-12-03 NOTE — Patient Instructions (Addendum)
It was great to see you again today!  STOP glipizide Stay on metformin. Just do one pill daily (I increased the dose). We increased the dose of your metoprolol but it will now be just once daily.  I will call you if your test results are not normal.  Otherwise, I will send you a letter.  If you do not hear from me with in 2 weeks please call our office.     Follow up with me in 3 months for your chronic medical problems.  Be well, Dr. Pollie MeyerMcIntyre

## 2013-12-03 NOTE — Progress Notes (Signed)
Patient ID: Stephen Fitzgerald, male   DOB: 1960-07-21, 53 y.o.   MRN: [id number]  HPI:  Pt presents for f/u of chronic medical problems. His Stephen Fitzgerald accompanies him. Dad has not noticed any problems lately.  DM - currently takes metformin XR 500mg  2 pills QAM and glipizide BID. A1c today 5.9. Denies any chest pain or shortness of breath. Does not check blood sugars.  HTN - does not check BP at home. Takes metoprolol 25mg  BID.   Pt did not want to come today, states he hates going to the doctor. He thinks he does not have diabetes and does not need these medications. He reports feeling nauseated in the mornings, and vomits water every morning. No abdominal pain. This has apparently gone on since he was admitted to the hospital last year. He does not vomit with lunch or dinner. Had a brief episode of diarrhea that lasted 2-3 days and then improved. He does not want to try any new medicines for the nausea. He strongly wants to take his medications just in the morning. 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   ROS: See HPI  PMFSH: lives by himself. His dad gets his medicine from the pharmacy and takes it to him. Dad sees him about twice per week. Cognitive & speech problems post CVA some number of years ago.  PHYSICAL EXAM: BP 167/98  Pulse 109  Temp(Src) 98.3 F (36.8 C) (Oral)  Ht 6' (1.829 m)  Wt 212 lb 11.2 oz (96.48 kg)  BMI 28.84 kg/m2 BP on recheck: 140/90. Gen: NAD HEENT: NCAT Heart: RRR Lungs: CTAB, NWOB Abdomen: soft, nontender to palpation Neuro: speech somewhat unintelligible  ASSESSMENT/PLAN:  # Health maintenance:  -pt stated today that he is not interested in colonoscopy  See problem based charting for additional assessment/plan.  FOLLOW UP: F/u in 3 months for chronic medical problems.  GrenadaBrittany J. Pollie MeyerMcIntyre, MD [facility]

## 2013-12-04 ENCOUNTER — Other Ambulatory Visit: Payer: Self-pay | Admitting: Family Medicine

## 2013-12-04 ENCOUNTER — Encounter: Payer: Self-pay | Admitting: *Deleted

## 2013-12-05 NOTE — Progress Notes (Signed)
PA for Metformin ER 1000 mg tab approved from [employer].  Approved for non-formulary exception through 07/16/2014 under Medicare Part D benefit.  Reference number:  [id number].  [facility] pharmacy aware of approval.  Clovis Pu, RN

## 2013-12-05 NOTE — Progress Notes (Signed)
Prior Authorization received from [facility] pharmacy for Metformin ER 1000 mg tabs. Provider completed PA form and faxed to [facility] for review. Clovis Pu, RN

## 2013-12-08 ENCOUNTER — Other Ambulatory Visit: Payer: Self-pay | Admitting: Family Medicine

## 2013-12-08 NOTE — Assessment & Plan Note (Signed)
BP elevated initially today, improved on recheck. Suspect secondary to stress of going to doctor's office. Will increase metoprolol dose and change it to once-daily formulation due to pt preference.

## 2013-12-08 NOTE — Assessment & Plan Note (Signed)
Tachy to 109 today. Will increase dose of metoprolol.

## 2013-12-08 NOTE — Assessment & Plan Note (Signed)
On lipitor, had wanted to check lipid panel today but cannot have lipid panel just yet for insurance, so will defer to next visit.

## 2013-12-08 NOTE — Assessment & Plan Note (Addendum)
A1c great at 5.9. Will stop glipizide. Change metformin to once daily dosing due to pt preference. F/u in 3 mos for repeat A1c.  Cardiac: on aspirin, statin. Check lipid panel at next visit. Renal: prior urine micro without evidence for proteinuria, will need to recheck at next office visit (Aug 2015). Will check labs at next visit (could not get lipid panel just yet due to insurance, so will defer all labs to next visit since pt is averse to needles/medications/doctors visits) Eye: address at next visit, seems to be up to date Foot: due for foot exam August 2015

## 2013-12-09 NOTE — Telephone Encounter (Signed)
Father would like to have 3 refills for this The reason for the change: insurance did approve 1000mg  but it will cost $80. Th500mg  will only cost $5

## 2013-12-12 ENCOUNTER — Telehealth: Payer: Self-pay | Admitting: *Deleted

## 2013-12-12 NOTE — Telephone Encounter (Signed)
Father called because there is a huge problem. They do not want the 1000 mg of Metformin because of the cost it is 80.00. They would like to go back to the 500 mg 60 qty twice a day because then it is only 5.00 . They did not pick up the 1000 mg because of the price and now need the 500 mg called in. The pharmacy is sending a request. Please call the father Stephen Fitzgerald at [phone] to let him know when this is done since he is going out of town and his son this because of his stroke. jw

## 2013-12-12 NOTE — Telephone Encounter (Signed)
Forward to PCP to change Rx.Stephen Fitzgerald

## 2013-12-12 NOTE — Telephone Encounter (Signed)
Received refill request for metformin ER 500 mg: 2 tab PO daily with breakfast #60.  Called [facility] and informed them that the PA for the 1000 mg was approved.  Pharmacist stated that the pt did not want to pay $80 co-pay for the 1000 mg tabs and requested to be placed back on previous dosage.  Clovis Pu, RN

## 2013-12-12 NOTE — Telephone Encounter (Signed)
New rx sent in. Thanks, Latrelle Dodrill, MD

## 2013-12-12 NOTE — Telephone Encounter (Signed)
Father calling back to find out if rx can be completed today before he leaves to go out of town.

## 2014-01-01 ENCOUNTER — Telehealth: Payer: Self-pay | Admitting: Family Medicine

## 2014-01-07 NOTE — Telephone Encounter (Signed)
Called Pt to schedule for LDL lab work as part of DM care. Please make appointment upon returned call.  ° °

## 2014-04-02 ENCOUNTER — Encounter: Payer: Self-pay | Admitting: Family Medicine

## 2014-04-02 ENCOUNTER — Ambulatory Visit (INDEPENDENT_AMBULATORY_CARE_PROVIDER_SITE_OTHER): Payer: Medicare Other | Admitting: Family Medicine

## 2014-04-02 VITALS — BP 110/70 | HR 109 | Temp 98.7°F | Ht 72.0 in | Wt 199.6 lb

## 2014-04-02 DIAGNOSIS — E785 Hyperlipidemia, unspecified: Secondary | ICD-10-CM

## 2014-04-02 DIAGNOSIS — E1165 Type 2 diabetes mellitus with hyperglycemia: Secondary | ICD-10-CM | POA: Diagnosis not present

## 2014-04-02 DIAGNOSIS — IMO0002 Reserved for concepts with insufficient information to code with codable children: Secondary | ICD-10-CM | POA: Diagnosis not present

## 2014-04-02 DIAGNOSIS — I10 Essential (primary) hypertension: Secondary | ICD-10-CM

## 2014-04-02 DIAGNOSIS — E118 Type 2 diabetes mellitus with unspecified complications: Principal | ICD-10-CM

## 2014-04-02 LAB — COMPREHENSIVE METABOLIC PANEL
ALBUMIN: 3.9 g/dL (ref 3.5–5.2)
ALT: 27 U/L (ref 0–53)
AST: 17 U/L (ref 0–37)
Alkaline Phosphatase: 96 U/L (ref 39–117)
BUN: 10 mg/dL (ref 6–23)
CHLORIDE: 107 meq/L (ref 96–112)
CO2: 22 meq/L (ref 19–32)
Calcium: 9.1 mg/dL (ref 8.4–10.5)
Creat: 0.81 mg/dL (ref 0.50–1.35)
GLUCOSE: 336 mg/dL — AB (ref 70–99)
POTASSIUM: 4.7 meq/L (ref 3.5–5.3)
SODIUM: 141 meq/L (ref 135–145)
TOTAL PROTEIN: 6.6 g/dL (ref 6.0–8.3)
Total Bilirubin: 0.5 mg/dL (ref 0.2–1.2)

## 2014-04-02 LAB — POCT GLYCOSYLATED HEMOGLOBIN (HGB A1C): Hemoglobin A1C: 11.9

## 2014-04-02 LAB — LIPID PANEL
CHOLESTEROL: 180 mg/dL (ref 0–200)
HDL: 64 mg/dL (ref >39)
LDL Cholesterol: 88 mg/dL (ref 0–99)
TRIGLYCERIDES: 140 mg/dL (ref <150)
Total CHOL/HDL Ratio: 2.8 Ratio
VLDL: 28 mg/dL (ref 0–40)

## 2014-04-02 MED ORDER — ATORVASTATIN CALCIUM 40 MG PO TABS: ORAL_TABLET | ORAL | Status: DC

## 2014-04-02 MED ORDER — METFORMIN HCL ER 500 MG PO TB24: ORAL_TABLET | ORAL | Status: DC

## 2014-04-02 NOTE — Assessment & Plan Note (Addendum)
Check A1c today. Will adjust meds as needed based on A1c.  Cardiac: on statin, aspirin Renal: check urine microalbumin today Eye: last exam April 2015, has appt next month Foot: refused foot exam today

## 2014-04-02 NOTE — Assessment & Plan Note (Signed)
Check lipids and CMET today. Will be nonfasting, but patient is so averse to any blood draws need to take advantage of this opportunity to get blood from him.

## 2014-04-02 NOTE — Assessment & Plan Note (Signed)
Well controlled. Continue metoprolol. 

## 2014-04-02 NOTE — Patient Instructions (Signed)
Continue current medicines. We will call you with any abnormal lab results.  Follow up in 3 months.  Be well, Dr. Pollie Meyer

## 2014-04-02 NOTE — Progress Notes (Signed)
Patient ID: Stephen Fitzgerald, male   DOB: [birth year], 53 y.o.   MRN: [id number]  HPI:  Diabetes: currently on metformin XR  each morning. Denies having chest pain or shortness of breath. Has eye appointment next month (last saw eye doctor in April). Refuses pneumonia and flu vaccines today. Also refuses foot exam today. Previously had a lot of nausea, that got better on its own and went away. Overall states he is doing well. Does not want to be here at the doctor today, accompanied by his father.  ROS: See HPI.  PMFSH: hx T2DM, tachycardia, HTN, HLD  PHYSICAL EXAM: BP 110/70  Pulse 109  Temp(Src) 98.7 F (37.1 C) (Oral)  Ht 6' (1.829 m)  Wt 199 lb 9.6 oz (90.538 kg)  BMI 27.06 kg/m2 Gen: NAD HEENT: NCAT Heart: RRR no murmurs Lungs: CTAB NWOB Neuro: grossly nonfocal, speech at pt's baseline Ext: refused foot exam today. 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 Remaining fingers slightly swollen and erythematous (at baseline per pt and father) but no warmth, tenderness, skin breakdown. Good capillary refill, sensation intact to light touch on distal R fingers.  ASSESSMENT/PLAN:  Health maintenance:  -Refused flu shot & pneumonia shot today  Type II or unspecified type diabetes mellitus with unspecified complication, uncontrolled Check A1c today.  Cardiac: on statin, aspirin Renal: check urine microalbumin today Eye: last exam April 2015, has appt next month Foot: refused foot exam today   Hypertension Well controlled. Continue metoprolol.  Hyperlipidemia Check lipids and CMET today. Will be nonfasting, but patient is so averse to any blood draws need to take advantage of this opportunity to get blood from him.   FOLLOW UP: F/u in 3 months for HTN, DM, HLD.  Grenada J. Pollie Meyer, MD [facility]

## 2014-04-03 ENCOUNTER — Other Ambulatory Visit: Payer: Self-pay | Admitting: Family Medicine

## 2014-04-03 LAB — MICROALBUMIN / CREATININE URINE RATIO
Creatinine, Urine: 61.7 mg/dL
MICROALB/CREAT RATIO: 8.1 mg/g (ref 0.0–30.0)
Microalb, Ur: 0.5 mg/dL (ref 0.00–1.89)

## 2014-04-10 ENCOUNTER — Telehealth: Payer: Self-pay | Admitting: *Deleted

## 2014-04-10 NOTE — Telephone Encounter (Signed)
Per kathy h pt left a message on her voicemail wanting to know his lab results. Please advise. [place]ount, Deseree CMA

## 2014-04-23 DIAGNOSIS — E119 Type 2 diabetes mellitus without complications: Secondary | ICD-10-CM | POA: Diagnosis not present

## 2014-04-23 DIAGNOSIS — Z961 Presence of intraocular lens: Secondary | ICD-10-CM | POA: Diagnosis not present

## 2014-04-23 LAB — HM DIABETES EYE EXAM

## 2014-04-30 NOTE — Telephone Encounter (Signed)
Addressed this in separate phone note. Closing encounter. Stephen DodrillBrittany J Clements Toro, MD

## 2014-08-11 ENCOUNTER — Encounter (HOSPITAL_COMMUNITY): Payer: Self-pay

## 2014-08-11 ENCOUNTER — Inpatient Hospital Stay (HOSPITAL_COMMUNITY)
Admission: EM | Admit: 2014-08-11 | Discharge: 2014-08-17 | DRG: 854 | Disposition: A | Discharge status: Skilled Nursing Facility | Payer: Medicare Other | Attending: Family Medicine | Admitting: Family Medicine

## 2014-08-11 ENCOUNTER — Emergency Department (HOSPITAL_COMMUNITY): Payer: Medicare Other

## 2014-08-11 DIAGNOSIS — L723 Sebaceous cyst: Secondary | ICD-10-CM | POA: Diagnosis present

## 2014-08-11 DIAGNOSIS — L089 Local infection of the skin and subcutaneous tissue, unspecified: Secondary | ICD-10-CM | POA: Diagnosis not present

## 2014-08-11 DIAGNOSIS — R7881 Bacteremia: Secondary | ICD-10-CM | POA: Diagnosis not present

## 2014-08-11 DIAGNOSIS — I70261 Atherosclerosis of native arteries of extremities with gangrene, right leg: Secondary | ICD-10-CM | POA: Diagnosis not present

## 2014-08-11 DIAGNOSIS — Z881 Allergy status to other antibiotic agents status: Secondary | ICD-10-CM | POA: Diagnosis not present

## 2014-08-11 DIAGNOSIS — I70262 Atherosclerosis of native arteries of extremities with gangrene, left leg: Secondary | ICD-10-CM | POA: Diagnosis not present

## 2014-08-11 DIAGNOSIS — I96 Gangrene, not elsewhere classified: Secondary | ICD-10-CM | POA: Diagnosis not present

## 2014-08-11 DIAGNOSIS — I10 Essential (primary) hypertension: Secondary | ICD-10-CM | POA: Diagnosis present

## 2014-08-11 DIAGNOSIS — M869 Osteomyelitis, unspecified: Secondary | ICD-10-CM | POA: Diagnosis present

## 2014-08-11 DIAGNOSIS — E1165 Type 2 diabetes mellitus with hyperglycemia: Secondary | ICD-10-CM | POA: Diagnosis present

## 2014-08-11 DIAGNOSIS — E1152 Type 2 diabetes mellitus with diabetic peripheral angiopathy with gangrene: Secondary | ICD-10-CM | POA: Diagnosis not present

## 2014-08-11 DIAGNOSIS — R278 Other lack of coordination: Secondary | ICD-10-CM | POA: Diagnosis not present

## 2014-08-11 DIAGNOSIS — IMO0001 Reserved for inherently not codable concepts without codable children: Secondary | ICD-10-CM | POA: Insufficient documentation

## 2014-08-11 DIAGNOSIS — G629 Polyneuropathy, unspecified: Secondary | ICD-10-CM | POA: Diagnosis present

## 2014-08-11 DIAGNOSIS — Z89022 Acquired absence of left finger(s): Secondary | ICD-10-CM | POA: Diagnosis not present

## 2014-08-11 DIAGNOSIS — Z79899 Other long term (current) drug therapy: Secondary | ICD-10-CM

## 2014-08-11 DIAGNOSIS — K30 Functional dyspepsia: Secondary | ICD-10-CM | POA: Diagnosis not present

## 2014-08-11 DIAGNOSIS — I998 Other disorder of circulatory system: Secondary | ICD-10-CM | POA: Diagnosis not present

## 2014-08-11 DIAGNOSIS — L03115 Cellulitis of right lower limb: Secondary | ICD-10-CM | POA: Diagnosis not present

## 2014-08-11 DIAGNOSIS — A419 Sepsis, unspecified organism: Principal | ICD-10-CM | POA: Diagnosis present

## 2014-08-11 DIAGNOSIS — S92411A Displaced fracture of proximal phalanx of right great toe, initial encounter for closed fracture: Secondary | ICD-10-CM | POA: Diagnosis present

## 2014-08-11 DIAGNOSIS — R4701 Aphasia: Secondary | ICD-10-CM | POA: Diagnosis present

## 2014-08-11 DIAGNOSIS — E1142 Type 2 diabetes mellitus with diabetic polyneuropathy: Secondary | ICD-10-CM | POA: Diagnosis not present

## 2014-08-11 DIAGNOSIS — Z89411 Acquired absence of right great toe: Secondary | ICD-10-CM | POA: Diagnosis not present

## 2014-08-11 DIAGNOSIS — L03012 Cellulitis of left finger: Secondary | ICD-10-CM | POA: Diagnosis not present

## 2014-08-11 DIAGNOSIS — K5900 Constipation, unspecified: Secondary | ICD-10-CM | POA: Diagnosis not present

## 2014-08-11 DIAGNOSIS — E785 Hyperlipidemia, unspecified: Secondary | ICD-10-CM | POA: Diagnosis present

## 2014-08-11 DIAGNOSIS — I350 Nonrheumatic aortic (valve) stenosis: Secondary | ICD-10-CM | POA: Diagnosis not present

## 2014-08-11 DIAGNOSIS — E119 Type 2 diabetes mellitus without complications: Secondary | ICD-10-CM | POA: Diagnosis not present

## 2014-08-11 DIAGNOSIS — G8918 Other acute postprocedural pain: Secondary | ICD-10-CM | POA: Diagnosis not present

## 2014-08-11 DIAGNOSIS — Z4781 Encounter for orthopedic aftercare following surgical amputation: Secondary | ICD-10-CM | POA: Diagnosis not present

## 2014-08-11 DIAGNOSIS — M7989 Other specified soft tissue disorders: Secondary | ICD-10-CM | POA: Diagnosis not present

## 2014-08-11 DIAGNOSIS — I70268 Atherosclerosis of native arteries of extremities with gangrene, other extremity: Secondary | ICD-10-CM | POA: Diagnosis not present

## 2014-08-11 DIAGNOSIS — M86179 Other acute osteomyelitis, unspecified ankle and foot: Secondary | ICD-10-CM | POA: Diagnosis present

## 2014-08-11 DIAGNOSIS — R52 Pain, unspecified: Secondary | ICD-10-CM | POA: Diagnosis not present

## 2014-08-11 DIAGNOSIS — R262 Difficulty in walking, not elsewhere classified: Secondary | ICD-10-CM | POA: Diagnosis not present

## 2014-08-11 DIAGNOSIS — R011 Cardiac murmur, unspecified: Secondary | ICD-10-CM | POA: Insufficient documentation

## 2014-08-11 DIAGNOSIS — Z794 Long term (current) use of insulin: Secondary | ICD-10-CM

## 2014-08-11 DIAGNOSIS — Z7982 Long term (current) use of aspirin: Secondary | ICD-10-CM | POA: Diagnosis not present

## 2014-08-11 DIAGNOSIS — Z8673 Personal history of transient ischemic attack (TIA), and cerebral infarction without residual deficits: Secondary | ICD-10-CM

## 2014-08-11 DIAGNOSIS — Z89422 Acquired absence of other left toe(s): Secondary | ICD-10-CM | POA: Diagnosis not present

## 2014-08-11 DIAGNOSIS — B999 Unspecified infectious disease: Secondary | ICD-10-CM

## 2014-08-11 DIAGNOSIS — S61205A Unspecified open wound of left ring finger without damage to nail, initial encounter: Secondary | ICD-10-CM | POA: Diagnosis not present

## 2014-08-11 DIAGNOSIS — R079 Chest pain, unspecified: Secondary | ICD-10-CM | POA: Diagnosis not present

## 2014-08-11 DIAGNOSIS — M86171 Other acute osteomyelitis, right ankle and foot: Secondary | ICD-10-CM | POA: Diagnosis not present

## 2014-08-11 DIAGNOSIS — M6281 Muscle weakness (generalized): Secondary | ICD-10-CM | POA: Diagnosis not present

## 2014-08-11 DIAGNOSIS — L539 Erythematous condition, unspecified: Secondary | ICD-10-CM | POA: Diagnosis not present

## 2014-08-11 LAB — CBC WITH DIFFERENTIAL/PLATELET
Basophils Absolute: 0 10*3/uL (ref 0.0–0.1)
Basophils Relative: 0 % (ref 0–1)
Eosinophils Absolute: 0.1 10*3/uL (ref 0.0–0.7)
Eosinophils Relative: 2 % (ref 0–5)
HEMATOCRIT: 36.6 % — AB (ref 39.0–52.0)
Hemoglobin: 12.2 g/dL — ABNORMAL LOW (ref 13.0–17.0)
LYMPHS ABS: 1.3 10*3/uL (ref 0.7–4.0)
Lymphocytes Relative: 15 % (ref 12–46)
MCH: 29 pg (ref 26.0–34.0)
MCHC: 33.3 g/dL (ref 30.0–36.0)
MCV: 86.9 fL (ref 78.0–100.0)
Monocytes Absolute: 0.6 10*3/uL (ref 0.1–1.0)
Monocytes Relative: 8 % (ref 3–12)
NEUTROS ABS: 6.4 10*3/uL (ref 1.7–7.7)
Neutrophils Relative %: 75 % (ref 43–77)
PLATELETS: 297 10*3/uL (ref 150–400)
RBC: 4.21 MIL/uL — ABNORMAL LOW (ref 4.22–5.81)
RDW: 12.6 % (ref 11.5–15.5)
WBC: 8.5 10*3/uL (ref 4.0–10.5)

## 2014-08-11 LAB — URINE MICROSCOPIC-ADD ON

## 2014-08-11 LAB — URINALYSIS, ROUTINE W REFLEX MICROSCOPIC
HGB URINE DIPSTICK: NEGATIVE
KETONES UR: 15 mg/dL — AB
Leukocytes, UA: NEGATIVE
Nitrite: NEGATIVE
Protein, ur: NEGATIVE mg/dL
Specific Gravity, Urine: 1.035 — ABNORMAL HIGH (ref 1.005–1.030)
Urobilinogen, UA: 0.2 mg/dL (ref 0.0–1.0)
pH: 5 (ref 5.0–8.0)

## 2014-08-11 LAB — COMPREHENSIVE METABOLIC PANEL
ALT: 13 U/L (ref 0–53)
AST: 15 U/L (ref 0–37)
Albumin: 2.5 g/dL — ABNORMAL LOW (ref 3.5–5.2)
Alkaline Phosphatase: 126 U/L — ABNORMAL HIGH (ref 39–117)
Anion gap: 13 (ref 5–15)
BUN: 14 mg/dL (ref 6–23)
CHLORIDE: 99 mmol/L (ref 96–112)
CO2: 23 mmol/L (ref 19–32)
Calcium: 8.7 mg/dL (ref 8.4–10.5)
Creatinine, Ser: 0.96 mg/dL (ref 0.50–1.35)
GFR calc Af Amer: >90 mL/min (ref >90)
GFR calc non Af Amer: >90 mL/min (ref >90)
GLUCOSE: 373 mg/dL — AB (ref 70–99)
Potassium: 5.1 mmol/L (ref 3.5–5.1)
SODIUM: 135 mmol/L (ref 135–145)
Total Bilirubin: 0.8 mg/dL (ref 0.3–1.2)
Total Protein: 7.5 g/dL (ref 6.0–8.3)

## 2014-08-11 LAB — GLUCOSE, CAPILLARY: Glucose-Capillary: 297 mg/dL — ABNORMAL HIGH (ref 70–99)

## 2014-08-11 LAB — I-STAT CG4 LACTIC ACID, ED
Lactic Acid, Venous: 1.82 mmol/L (ref 0.5–2.0)
Lactic Acid, Venous: 1.09 mmol/L (ref 0.5–2.0)

## 2014-08-11 LAB — SEDIMENTATION RATE: SED RATE: 118 mm/h — AB (ref 0–16)

## 2014-08-11 MED ORDER — OXYCODONE HCL 5 MG PO TABS
5.0000 mg | ORAL_TABLET | ORAL | Status: DC | PRN
  Administered 2014-08-12: 5 mg via ORAL
  Filled 2014-08-11: qty 1

## 2014-08-11 MED ORDER — METOPROLOL SUCCINATE ER 50 MG PO TB24
50.0000 mg | ORAL_TABLET | Freq: Every day | ORAL | Status: DC
  Administered 2014-08-12 – 2014-08-17 (×6): 50 mg via ORAL
  Filled 2014-08-11 (×7): qty 1

## 2014-08-11 MED ORDER — METRONIDAZOLE IN NACL 5-0.79 MG/ML-% IV SOLN
500.0000 mg | Freq: Three times a day (TID) | INTRAVENOUS | Status: DC
  Administered 2014-08-11 – 2014-08-15 (×12): 500 mg via INTRAVENOUS
  Filled 2014-08-11 (×14): qty 100

## 2014-08-11 MED ORDER — ACETAMINOPHEN 500 MG PO TABS
1000.0000 mg | ORAL_TABLET | Freq: Once | ORAL | Status: AC
  Administered 2014-08-11: 1000 mg via ORAL
  Filled 2014-08-11: qty 2

## 2014-08-11 MED ORDER — ONDANSETRON HCL 4 MG PO TABS: 4.0000 mg | ORAL_TABLET | Freq: Four times a day (QID) | ORAL | Status: DC | PRN

## 2014-08-11 MED ORDER — INSULIN ASPART 100 UNIT/ML ~~LOC~~ SOLN
0.0000 [IU] | Freq: Three times a day (TID) | SUBCUTANEOUS | Status: DC
  Administered 2014-08-12: 3 [IU] via SUBCUTANEOUS
  Administered 2014-08-12: 2 [IU] via SUBCUTANEOUS
  Administered 2014-08-12: 5 [IU] via SUBCUTANEOUS
  Administered 2014-08-13: 7 [IU] via SUBCUTANEOUS
  Administered 2014-08-13: 2 [IU] via SUBCUTANEOUS

## 2014-08-11 MED ORDER — DEXTROSE 5 % IV SOLN
2.0000 g | INTRAVENOUS | Status: DC
  Administered 2014-08-11 – 2014-08-15 (×5): 2 g via INTRAVENOUS
  Filled 2014-08-11 (×7): qty 2

## 2014-08-11 MED ORDER — ACETAMINOPHEN 650 MG RE SUPP: 650.0000 mg | Freq: Four times a day (QID) | RECTAL | Status: DC | PRN

## 2014-08-11 MED ORDER — SODIUM CHLORIDE 0.9 % IV BOLUS (SEPSIS)
1000.0000 mL | Freq: Once | INTRAVENOUS | Status: AC
  Administered 2014-08-11: 1000 mL via INTRAVENOUS

## 2014-08-11 MED ORDER — VITAMIN B-12 100 MCG PO TABS
50.0000 ug | ORAL_TABLET | Freq: Every day | ORAL | Status: DC
  Administered 2014-08-12 – 2014-08-16 (×5): 50 ug via ORAL
  Administered 2014-08-17: 10:00:00 via ORAL
  Filled 2014-08-11 (×7): qty 1

## 2014-08-11 MED ORDER — ACETAMINOPHEN 325 MG PO TABS: 650.0000 mg | ORAL_TABLET | Freq: Four times a day (QID) | ORAL | Status: DC | PRN

## 2014-08-11 MED ORDER — SODIUM CHLORIDE 0.9 % IV SOLN
INTRAVENOUS | Status: DC
  Administered 2014-08-12 (×2): via INTRAVENOUS
  Administered 2014-08-13: 1000 mL via INTRAVENOUS
  Administered 2014-08-15: 75 mL/h via INTRAVENOUS
  Administered 2014-08-16 (×2): via INTRAVENOUS
  Administered 2014-08-17: 1000 mL via INTRAVENOUS

## 2014-08-11 MED ORDER — ASPIRIN 325 MG PO TABS
325.0000 mg | ORAL_TABLET | Freq: Every day | ORAL | Status: DC
  Administered 2014-08-12 – 2014-08-17 (×6): 325 mg via ORAL
  Filled 2014-08-11 (×7): qty 1

## 2014-08-11 MED ORDER — ATORVASTATIN CALCIUM 40 MG PO TABS
40.0000 mg | ORAL_TABLET | Freq: Every day | ORAL | Status: DC
  Administered 2014-08-12 – 2014-08-17 (×6): 40 mg via ORAL
  Filled 2014-08-11 (×7): qty 1

## 2014-08-11 MED ORDER — HEPARIN SODIUM (PORCINE) 5000 UNIT/ML IJ SOLN
5000.0000 [IU] | Freq: Three times a day (TID) | INTRAMUSCULAR | Status: DC
  Administered 2014-08-12 – 2014-08-17 (×13): 5000 [IU] via SUBCUTANEOUS
  Filled 2014-08-11 (×20): qty 1

## 2014-08-11 MED ORDER — ONDANSETRON HCL 4 MG/2ML IJ SOLN
4.0000 mg | Freq: Four times a day (QID) | INTRAMUSCULAR | Status: DC | PRN
  Administered 2014-08-16: 4 mg via INTRAVENOUS
  Filled 2014-08-11: qty 2

## 2014-08-11 MED ORDER — SODIUM CHLORIDE 0.9 % IV BOLUS (SEPSIS)
1000.0000 mL | Freq: Once | INTRAVENOUS | Status: AC
Start: 2014-08-11 — End: 2014-08-11
  Administered 2014-08-11: 1000 mL via INTRAVENOUS

## 2014-08-11 MED ORDER — SODIUM CHLORIDE 0.9 % IJ SOLN
3.0000 mL | Freq: Two times a day (BID) | INTRAMUSCULAR | Status: DC
  Administered 2014-08-12 – 2014-08-14 (×4): 3 mL via INTRAVENOUS

## 2014-08-11 NOTE — ED Notes (Signed)
Unable to get blood with iv stick, lab made aware someone needs to come draw the rest of pts blood orders.

## 2014-08-11 NOTE — ED Notes (Signed)
Pt to radiology.

## 2014-08-11 NOTE — ED Provider Notes (Signed)
CSN: [id number]     Arrival date & time 08/11/14  1126 History   First MD Initiated Contact with Patient 08/11/14 1608     Chief Complaint  Patient presents with  . Wound Infection     (Consider location/radiation/quality/duration/timing/severity/associated sxs/prior Treatment) The history is provided by the patient, a parent and medical records. No language interpreter was used.     OSKER AYOUB is a 54 y.o. male  with a hx of NIDDM, CVA, HTN presents to the Emergency Department complaining of gradual, persistent, progressively worsening right foot infection onset approx 1 month ago.  Pt reports the black toe has worsened.  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  Pt reports intense pain in the right foot.  Pt reports he has been taking his medications as directed, but does not check his CBG at home.  Pt also c/o left right finger infection beginning before Thanksgiving..  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  Patient's father is at bedside and reports that the skin graft looked okay at Thanksgiving. Patient denies known trauma. Patient denies fever or chills, headache, neck pain, chest pain, shortness of breath, abdominal pain, nausea, vomiting, diarrhea, weakness, dizziness, syncope, dysuria, hematuria, polyuria, polydipsia. He reports he has been cleaning the wounds and wrapping them with tape but they have continued to worsen. Pt presents from Dr. Carollee Massed office after evaluation for admission and amputation.     Past Medical History  Diagnosis Date  . Diabetes mellitus without complication   . Stroke     expressive aphasia  . Hypertension    Past Surgical History  Procedure Laterality Date  . No past surgeries    . I&d extremity Left 12/04/2012    Procedure: IRRIGATION AND DEBRIDEMENT Left  Hand with Ring Removal  times three, Carpal Tunnel , Flexor tendon Synovectomy;  Surgeon: [name], MD;  Location: [facility] OR;  Service: Orthopedics;  Laterality: Left;  . Amputation Left 12/06/2012    Procedure: AMPUTATION DIGIT LEFT INDEX;  Surgeon: [name], MD;  Location: [facility] OR;  Service: Orthopedics;  Laterality: Left;  . I&d extremity Left 12/06/2012    Procedure: IRRIGATION AND DEBRIDEMENT EXTREMITY LEFT HAND;  Surgeon: [name], MD;  Location: [facility] OR;  Service: Orthopedics;  Laterality: Left;  . Hernia repair      bilat ing age 36months  . Tendon repair Left 12/23/2012    Procedure: DIVISION AND INSETTING FLAP TO LEFT RING FINGER ADJACENT TISSUE REARRANGEMENT LEFT INDEX FINGER AMPUTATION SITE;  Surgeon: [name], MD;  Location: [facility];  Service: Orthopedics;  Laterality: Left;   History reviewed. No pertinent family history. History  Substance Use Topics  . Smoking status: Never Smoker   . Smokeless tobacco: Never Used  . Alcohol Use: 4.8 oz/week    8 Cans of beer per week     Comment: 8-12 beers qd     Review of Systems  Constitutional: Negative for fever, chills, diaphoresis, appetite change, fatigue and unexpected weight change.  HENT: Negative for mouth sores.   Eyes: Negative for visual disturbance.  Respiratory: Negative for cough, chest tightness, shortness of breath and wheezing.   Cardiovascular: Negative for chest pain.  Gastrointestinal: Negative for nausea, vomiting, abdominal pain, diarrhea and constipation.  Endocrine: Negative for polydipsia, polyphagia  and polyuria.  Genitourinary: Negative for dysuria, urgency, frequency and hematuria.  Musculoskeletal: Positive for joint swelling and arthralgias. Negative for back pain, neck pain and neck stiffness.  Skin: Positive for color change and wound. Negative for rash.  Allergic/Immunologic: Negative for immunocompromised state.  Neurological: Negative for syncope,  light-headedness, numbness and headaches.  Hematological: Does not bruise/bleed easily.  Psychiatric/Behavioral: Negative for sleep disturbance. The patient is not nervous/anxious.   All other systems reviewed and are negative.     Allergies  Unasyn  Home Medications   Prior to Admission medications   Medication Sig Start Date End Date Taking? Authorizing Provider  aspirin 325 MG tablet Take 325 mg by mouth daily.    Historical Provider, MD  atorvastatin (LIPITOR) 40 MG tablet TAKE 1 TABLET BY MOUTH DAILY 04/02/14   Latrelle Dodrill, MD  metFORMIN (GLUCOPHAGE-XR) 500 MG 24 hr tablet TAKE 2 TABLETS BY MOUTH DAILY WITH BREAKFAST 04/02/14   Latrelle Dodrill, MD  metoprolol succinate (TOPROL-XL) 50 MG 24 hr tablet TAKE 1 TABLET BY MOUTH DAILY 04/03/14   Latrelle Dodrill, MD  vitamin B-12 (CYANOCOBALAMIN) 100 MCG tablet Take 50 mcg by mouth daily.    Historical Provider, MD   BP 115/78 mmHg  Pulse 114  Temp(Src) 101.2 F (38.4 C) (Rectal)  Resp 12  Ht 5\' 11"  (1.803 m)  Wt 200 lb (90.719 kg)  BMI 27.91 kg/m2  SpO2 98% Physical Exam  Constitutional: He is oriented to person, place, and time. He appears well-developed and well-nourished. No distress.  Awake, alert, nontoxic appearance  HENT:  Head: Normocephalic and atraumatic.  Mouth/Throat: Oropharynx is clear and moist. No oropharyngeal exudate.  Eyes: Conjunctivae are normal. No scleral icterus.  Neck: Normal range of motion. Neck supple.  Cardiovascular: Regular rhythm, normal heart sounds and intact distal pulses.   Significant tachycardia  Pulmonary/Chest: Effort normal and breath sounds normal. No respiratory distress. He has no wheezes.  Equal chest expansion  Abdominal: Soft. Bowel sounds are normal. He exhibits no distension and no mass. There is no tenderness. There is no rebound and no guarding.  Musculoskeletal: Normal range of motion. He exhibits no edema.  Necrosis of the right great toe with extending  erythema and evidence of abscess to the distal foot, worst on the lateral side  Infection and some necrosis to the left ring finger graft with extending erythema down the finger but not onto the palm or dorsum of the hand  Lymphadenopathy:    He has no cervical adenopathy.  Neurological: He is alert and oriented to person, place, and time.  Speech is clear and goal oriented Moves extremities without ataxia  Skin: Skin is warm and dry. He is not diaphoretic. There is erythema.  Psychiatric: He has a normal mood and affect.  Nursing note and vitals reviewed.   ED Course  Procedures (including critical care time) Labs Review Labs Reviewed  CBC WITH DIFFERENTIAL/PLATELET - Abnormal; Notable for the following:    RBC 4.21 (*)    Hemoglobin 12.2 (*)    HCT 36.6 (*)    All other components within normal limits  COMPREHENSIVE METABOLIC PANEL - Abnormal; Notable for the following:    Glucose, Bld 373 (*)    Albumin 2.5 (*)    Alkaline Phosphatase 126 (*)    All other components within normal limits  URINALYSIS, ROUTINE W REFLEX MICROSCOPIC - Abnormal; Notable for the following:    Specific Gravity, Urine 1.035 (*)    Glucose, UA >1000 (*)  Bilirubin Urine SMALL (*)    Ketones, ur 15 (*)    All other components within normal limits  SEDIMENTATION RATE - Abnormal; Notable for the following:    Sed Rate 118 (*)    All other components within normal limits  URINE MICROSCOPIC-ADD ON - Abnormal; Notable for the following:    Casts HYALINE CASTS (*)    All other components within normal limits  CULTURE, BLOOD (ROUTINE X 2)  CULTURE, BLOOD (ROUTINE X 2)  URINE CULTURE  HEMOGLOBIN A1C  HIV ANTIBODY (ROUTINE TESTING)  C-REACTIVE PROTEIN  I-STAT CG4 LACTIC ACID, ED  CBG MONITORING, ED  I-STAT CG4 LACTIC ACID, ED    Imaging Review Dg Chest 1 View  08/11/2014   CLINICAL DATA:  Chest pain for 1 day, initial encounter  EXAM: CHEST - 1 VIEW  COMPARISON:  None.  FINDINGS: The heart size  and mediastinal contours are within normal limits. Both lungs are clear. The visualized skeletal structures are unremarkable.  IMPRESSION: No active disease.   Electronically Signed   By: Alcide CleverMark  Lukens M.D.   On: 08/11/2014 17:46   Dg Hand Complete Left  08/11/2014   CLINICAL DATA:  Fourth finger pain distally with erythema and swelling. Concern for osteomyelitis.  EXAM: LEFT HAND - COMPLETE 3+ VIEW  COMPARISON:  12/03/2012  FINDINGS: There is soft tissue swelling of the fourth digit. There is bony destruction at the ulnar aspect of the fourth middle phalanx and the fourth distal phalanx adjacent to the distal interphalangeal joint. This is suspicious for osteomyelitis in this clinical setting.  There is amputation of the second digit at the level of the metatarsal midshaft.  IMPRESSION: Bony destruction about the fourth DIP, suspicious for osteomyelitis   Electronically Signed   By: Ellery Plunkaniel R Mitchell M.D.   On: 08/11/2014 17:49   Dg Foot Complete Right  08/11/2014   CLINICAL DATA:  RIGHT great toe pain, black and, diabetes, hypertension, infection, question osteomyelitis  EXAM: RIGHT FOOT COMPLETE - 3+ VIEW  COMPARISON:  None.  FINDINGS: Diffuse soft tissue swelling greatest medially at distal RIGHT foot extending to great toe.  Foci of soft tissue gas are identified at the medial plantar aspect of the RIGHT great toe beginning at the level of the first metatarsal head and extending through the base of the distal phalanx consistent with infection by gas-forming organism.  Additionally, large area bone destruction identified at the medial margin of the distal first metatarsal consistent with osteomyelitis.  Additionally, minimally displaced fracture is seen at base of proximal phalanx great toe.  This extends intra-articular at the first MTP joint.  Remaining bones demineralized but intact.  No additional fracture, dislocation or bone destruction.  Small vessel vascular calcifications.  IMPRESSION: Minimally  displaced fracture at base of proximal phalanx RIGHT great toe extending intra-articular at first MTP joint.  Extensive soft tissue infection by gas-forming organism extending from the level of the distal first metatarsal to the base of the distal phalanx great toe.  Osteomyelitis with bone destruction at the medial margin of the head of the first metatarsal.   Electronically Signed   By: Ulyses SouthwardMark  Boles M.D.   On: 08/11/2014 17:49     EKG Interpretation None                MDM   Final diagnoses:  Infection  Pain  Osteomyelitis  Sepsis, due to unspecified organism     Lamar SprinklesJeffrey W Muraoka presents with significant infection to the left ring finger  and right foot. Infections are concerning for gangrene and likely osteomyelitis. Patient is significantly tachycardic and is febrile.  Patient meets sepsis criteria. Will begin sepsis workup with plan for admission.  7:00 PM Patient with normal lactic acid and white blood cell count however x-rays of both the left hand and right foot show osteomyelitis and patient's sedimentation rate is significantly elevated. He remained slightly tachycardic though improved from initial presentation.  Patient with elevated blood sugar into the 300s but no evidence of DKA. No evidence of urinary tract infection or pneumonia.  Urine culture and blood cultures pending. Patient has been started on diabetic foot order antibiotics including Rocephin and Flagyl. Will consult for admission.  7:57 PM Discussed with Dr. Janee Morn who has consulted with Dr. Lajoyce Corners about surgical management of this patient.  Dr. Lajoyce Corners will evaluate the patient in the morning.    Discussed with Dr. Waynetta Sandy who will admit to tele.    BP 115/78 mmHg  Pulse 114  Temp(Src) 101.2 F (38.4 C) (Rectal)  Resp 12  Ht  (1.803 m)  Wt 200 lb (90.719 kg)  BMI 27.91 kg/m2  SpO2 98%   The patient was discussed with and seen by Dr. Pecola Leisure who agrees with the treatment plan.   Dahlia Client  Lilliemae Fruge, PA-C 08/11/14 2001  Tilden Fossa, MD 08/11/14 2013

## 2014-08-11 NOTE — Progress Notes (Signed)
Report taken from Chelsea, RN in ED 

## 2014-08-11 NOTE — ED Notes (Signed)
Pt states he had wounds that began in November on his rt great toe and left 4th finger, has not seen an doctor until today. Pt has hx of diabetes. Wound on rt great toe is red with purulent fluid draining and eschar present. Left 4th finger red with some eschar present on proximal side. Pt denies pain at this time.

## 2014-08-11 NOTE — ED Notes (Signed)
Pt sent to ED for wound infection to right toe and left finger.  Pt reports green drainage from left middle finger and right great toe is black.

## 2014-08-11 NOTE — H&P (Signed)
Family Medicine Teaching [facility] Admission History and Physical Service Pager: [phone]  Patient name: Stephen Fitzgerald Medical record number: [id number] Date of birth: Oct 14, 1960 Age: 54 y.o. Gender: male  Primary Care Provider: Levert Feinstein, MD Consultants: Orthopedic Code Status: Full  Chief Complaint: foot wound  Assessment and Plan: Stephen Fitzgerald is a 54 y.o. male presenting with osteomyelitis and sepsis. 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 Seen by ortho hand today in office and sent to ED. Sepsis criteria on admission by tachycardia, fever, foot/hand as source. Pt vitals have improved, now afebrile and tachycardia improved; does not appear clinically ill on exam. ED workup significant for normal lactic acid 1.82 and 1.09, WBC 8.5, electrolytes wnl. Plain films demonstrate bony destruction 4th DIP consistent with osteomyelitis, fracture base of right great toe and extensive soft tissue infection with gas, bony destruction medial margin head of first metatarsal. Pt started on ceftriaxone and flagyl in ED for severe diabetic foot without risk factors for MRSA or pseudomonas. Coverage also not ideal for c. Perfringens but does not appear to be gas gangrene and more likely mixed aerobic/anerobic organisms. - admit to tele - blood and urine cultures - continue ceftriaxone, flagyl. If worsens will broaden for pseudomonas and MRSA coverage - consults to orthopedics, Drs. Janee Morn and Lajoyce Corners to evaluate in the morning, appreciate recs and assistance in management - ABI / Toe brachial index - wound care consult - repeat cbc and bmet in am - apap and oxyIR for pain control  # DM: - A1c - CBG qac qhs - SSI, sensitive  # HTN: - continue toprol xl   # HLD: - continue atorva  # Probable sebaceous  cyst right upper back: - pt declines any intervention for this  FEN/GI: diet NPO after midnight / NS 75 cc/hr Prophylaxis: heparin sq  Disposition: admit  History of Present Illness: History is limited by patient's baseline aphasia, difficult to follow due to word swapping and rapid speech.  Stephen Fitzgerald is a 54 y.o. male presenting from ortho hand clinic for severe necrosis/infections of left hand and right foot. Pt with history of CVA and digit gangrene requiring surgical amputation. Pt was at his ortho hand doctors office today (Dr. Janee Morn) for follow up and was told to go to the ED after he saw his right foot wound. As best I can discern, pt first started having wound develop in September/october on his left hand which he says he put "cream" and "rubbing alcohol" on, it became black and necrotic but healed over with new skin, and then the skin cracked and continued to get progressively worse ?november. He says his foot wound also started after November, may have hit it but can't tell exactly what pt was referring to. His father at bedside says that he tries to get his son to go to the doctors but pt resists, he was unaware of his foot wound until today. Pt states that his foot and ankle "burn and tingle", but dad believes he is swapping words and really means it is painful.   Review Of Systems: Per HPI with the following additions: no CP, no SOB, no fevers or chills, no nausea or vomiting, no diarrhea Otherwise 12 point review of systems was performed and was unremarkable.  Patient Active Problem List   Diagnosis Date Noted  .  Heart murmur 07/24/2013  . Hyperlipidemia 03/06/2013  . Hypertension 03/06/2013  . Tachycardia 01/13/2013  . Type II or unspecified type diabetes mellitus with unspecified complication, uncontrolled 12/08/2012  . Abscess of hand, left 12/08/2012  . Flexor tenosynovitis of finger 12/08/2012  . Expressive aphasia syndrome 12/08/2012  . Late effects of  cerebrovascular accident 12/08/2012   Past Medical History: Past Medical History  Diagnosis Date  . Diabetes mellitus without complication   . Stroke     expressive aphasia  . Hypertension    Past Surgical History: Past Surgical History  Procedure Laterality Date  . No past surgeries    . I&d extremity Left 12/04/2012    Procedure: IRRIGATION AND DEBRIDEMENT Left Hand with Ring Removal  times three, Carpal Tunnel , Flexor tendon Synovectomy;  Surgeon: Jodi Marbleavid A Thompson, MD;  [facility];  Service: Orthopedics;  Laterality: Left;  . Amputation Left 12/06/2012    Procedure: AMPUTATION DIGIT LEFT INDEX;  Surgeon: Jodi Marbleavid A Thompson, MD;  [facility];  Service: Orthopedics;  Laterality: Left;  . I&d extremity Left 12/06/2012    Procedure: IRRIGATION AND DEBRIDEMENT EXTREMITY LEFT HAND;  Surgeon: Jodi Marbleavid A Thompson, MD;  [facility];  Service: Orthopedics;  Laterality: Left;  . Hernia repair      bilat ing age 18months  . Tendon repair Left 12/23/2012    Procedure: DIVISION AND INSETTING FLAP TO LEFT RING FINGER ADJACENT TISSUE REARRANGEMENT LEFT INDEX FINGER AMPUTATION SITE;  Surgeon: Jodi Marbleavid A Thompson, MD;  Location: [facility];  Service: Orthopedics;  Laterality: Left;   Social History: History  Substance Use Topics  . Smoking status: Never Smoker   . Smokeless tobacco: Never Used  . Alcohol Use: 4.8 oz/week    8 Cans of beer per week     Comment: 8-12 beers qd    Additional social history: lives at home by himself, able to take care of ADLs and drive himself Please also refer to relevant sections of EMR.  Family History: History reviewed. No pertinent family history. Allergies and Medications: Allergies  Allergen Reactions  . Unasyn [Ampicillin-Sulbactam Sodium] Rash   No current facility-administered medications on file prior to encounter.   Current Outpatient Prescriptions on File Prior to Encounter  Medication Sig Dispense Refill  . aspirin 325 MG  tablet Take 325 mg by mouth daily.    Marland Kitchen. atorvastatin (LIPITOR) 40 MG tablet TAKE 1 TABLET BY MOUTH DAILY 90 tablet 3  . metFORMIN (GLUCOPHAGE-XR) 500 MG 24 hr tablet TAKE 2 TABLETS BY MOUTH DAILY WITH BREAKFAST 180 tablet 3  . metoprolol succinate (TOPROL-XL) 50 MG 24 hr tablet TAKE 1 TABLET BY MOUTH DAILY 30 tablet 5  . vitamin B-12 (CYANOCOBALAMIN) 100 MCG tablet Take 50 mcg by mouth daily.      Objective: BP 109/56 mmHg  Pulse 111  Temp(Src) 101.2 F (38.4 C) (Rectal)  Resp 12  Ht 5\' 11"  (1.803 m)  Wt 200 lb (90.719 kg)  BMI 27.91 kg/m2  SpO2 98% Exam: General: NAD HEENT: PERRL, EOMI, mucous membranes dry Cardiovascular: tachycardic, regular rhythm, normal s1s2, systolic murmur present. 2+ radial pulses. Left PT and DP pulses 2+, unable to palpate right PT and DP. Respiratory: clear bilaterally, normal effort Abdomen: soft, nontender, nondistended, normal bowel sounds Extremities: pitting edema right foot and ankle. Right foot: great toe black and necrotic down into area of distal metatarsal, erythematous and swollen to mid foot, tender up to lower part of ankle. Left 4th digit swollen, tender, necrotic  DIP (SEE PICTURES) Skin: erythematous right foot, left 4th digit, see below. Also has ~2cm firm, mobile mass right side upper back consistent with large sebaceous cyst Neuro: alert, aphasia at baseline/word salad, understands language and questions well. No focal deficits otherwise noted  (pictures from ED provider 08/11/2014)           Labs and Imaging: CBC BMET   Recent Labs Lab 08/11/14 1644  WBC 8.5  HGB 12.2*  HCT 36.6*  PLT 297    Recent Labs Lab 08/11/14 1644  NA 135  K 5.1  CL 99  CO2 23  BUN 14  CREATININE 0.96  GLUCOSE 373*  CALCIUM 8.7     Dg Chest 1 View  08/11/2014   CLINICAL DATA:  Chest pain for 1 day, initial encounter  EXAM: CHEST - 1 VIEW  COMPARISON:  None.  FINDINGS: The heart size and mediastinal contours are within normal limits.  Both lungs are clear. The visualized skeletal structures are unremarkable.  IMPRESSION: No active disease.   Electronically Signed   By: Alcide Clever M.D.   On: 08/11/2014 17:46   Dg Hand Complete Left  08/11/2014   CLINICAL DATA:  Fourth finger pain distally with erythema and swelling. Concern for osteomyelitis.  EXAM: LEFT HAND - COMPLETE 3+ VIEW  COMPARISON:  12/03/2012  FINDINGS: There is soft tissue swelling of the fourth digit. There is bony destruction at the ulnar aspect of the fourth middle phalanx and the fourth distal phalanx adjacent to the distal interphalangeal joint. This is suspicious for osteomyelitis in this clinical setting.  There is amputation of the second digit at the level of the metatarsal midshaft.  IMPRESSION: Bony destruction about the fourth DIP, suspicious for osteomyelitis   Electronically Signed   By: Ellery Plunk M.D.   On: 08/11/2014 17:49   Dg Foot Complete Right  08/11/2014   CLINICAL DATA:  RIGHT great toe pain, black and, diabetes, hypertension, infection, question osteomyelitis  EXAM: RIGHT FOOT COMPLETE - 3+ VIEW  COMPARISON:  None.  FINDINGS: Diffuse soft tissue swelling greatest medially at distal RIGHT foot extending to great toe.  Foci of soft tissue gas are identified at the medial plantar aspect of the RIGHT great toe beginning at the level of the first metatarsal head and extending through the base of the distal phalanx consistent with infection by gas-forming organism.  Additionally, large area bone destruction identified at the medial margin of the distal first metatarsal consistent with osteomyelitis.  Additionally, minimally displaced fracture is seen at base of proximal phalanx great toe.  This extends intra-articular at the first MTP joint.  Remaining bones demineralized but intact.  No additional fracture, dislocation or bone destruction.  Small vessel vascular calcifications.  IMPRESSION: Minimally displaced fracture at base of proximal phalanx RIGHT  great toe extending intra-articular at first MTP joint.  Extensive soft tissue infection by gas-forming organism extending from the level of the distal first metatarsal to the base of the distal phalanx great toe.  Osteomyelitis with bone destruction at the medial margin of the head of the first metatarsal.   Electronically Signed   By: Ulyses Southward M.D.   On: 08/11/2014 17:49     Nani Ravens, MD 08/11/2014, 7:51 PM PGY-2, [facility] FPTS Intern pager: [phone], text pages welcome

## 2014-08-12 DIAGNOSIS — M869 Osteomyelitis, unspecified: Secondary | ICD-10-CM

## 2014-08-12 DIAGNOSIS — M86179 Other acute osteomyelitis, unspecified ankle and foot: Secondary | ICD-10-CM

## 2014-08-12 LAB — GLUCOSE, CAPILLARY
GLUCOSE-CAPILLARY: 192 mg/dL — AB (ref 70–99)
Glucose-Capillary: 288 mg/dL — ABNORMAL HIGH (ref 70–99)
Glucose-Capillary: 244 mg/dL — ABNORMAL HIGH (ref 70–99)
Glucose-Capillary: 316 mg/dL — ABNORMAL HIGH (ref 70–99)

## 2014-08-12 LAB — CBC
HCT: 32.6 % — ABNORMAL LOW (ref 39.0–52.0)
Hemoglobin: 10.7 g/dL — ABNORMAL LOW (ref 13.0–17.0)
MCH: 29 pg (ref 26.0–34.0)
MCHC: 32.8 g/dL (ref 30.0–36.0)
MCV: 88.3 fL (ref 78.0–100.0)
Platelets: 256 10*3/uL (ref 150–400)
RBC: 3.69 MIL/uL — ABNORMAL LOW (ref 4.22–5.81)
RDW: 12.7 % (ref 11.5–15.5)
WBC: 6.5 10*3/uL (ref 4.0–10.5)

## 2014-08-12 LAB — BASIC METABOLIC PANEL
Anion gap: 9 (ref 5–15)
BUN: 8 mg/dL (ref 6–23)
CO2: 22 mmol/L (ref 19–32)
Calcium: 7.9 mg/dL — ABNORMAL LOW (ref 8.4–10.5)
Chloride: 105 mmol/L (ref 96–112)
Creatinine, Ser: 0.67 mg/dL (ref 0.50–1.35)
GFR calc Af Amer: >90 mL/min (ref >90)
GFR calc non Af Amer: >90 mL/min (ref >90)
Glucose, Bld: 224 mg/dL — ABNORMAL HIGH (ref 70–99)
Potassium: 4.2 mmol/L (ref 3.5–5.1)
SODIUM: 136 mmol/L (ref 135–145)

## 2014-08-12 LAB — HEMOGLOBIN A1C
Hgb A1c MFr Bld: 11.6 % — ABNORMAL HIGH (ref <5.7)
Mean Plasma Glucose: 286 mg/dL — ABNORMAL HIGH (ref <117)

## 2014-08-12 LAB — C-REACTIVE PROTEIN: CRP: 24.4 mg/dL — ABNORMAL HIGH (ref <0.60)

## 2014-08-12 MED ORDER — VANCOMYCIN HCL IN DEXTROSE 1-5 GM/200ML-% IV SOLN
1000.0000 mg | Freq: Three times a day (TID) | INTRAVENOUS | Status: DC
  Administered 2014-08-12 – 2014-08-13 (×3): 1000 mg via INTRAVENOUS
  Filled 2014-08-12 (×8): qty 200

## 2014-08-12 MED ORDER — PNEUMOCOCCAL VAC POLYVALENT 25 MCG/0.5ML IJ INJ
0.5000 mL | INJECTION | INTRAMUSCULAR | Status: DC
  Filled 2014-08-12: qty 0.5

## 2014-08-12 MED ORDER — INFLUENZA VAC SPLIT QUAD 0.5 ML IM SUSY
0.5000 mL | PREFILLED_SYRINGE | INTRAMUSCULAR | Status: DC
  Filled 2014-08-12: qty 0.5

## 2014-08-12 MED ORDER — PRO-STAT SUGAR FREE PO LIQD
30.0000 mL | Freq: Three times a day (TID) | ORAL | Status: DC
  Administered 2014-08-12 – 2014-08-17 (×7): 30 mL via ORAL
  Filled 2014-08-12 (×16): qty 30

## 2014-08-12 NOTE — Progress Notes (Signed)
Utilization review completed. Demaree Liberto, RN, BSN. 

## 2014-08-12 NOTE — Progress Notes (Signed)
ANTIBIOTIC CONSULT NOTE - INITIAL  Pharmacy Consult for vancomycin Indication: Osteomyelitis  Allergies  Allergen Reactions  . Unasyn [Ampicillin-Sulbactam Sodium] Rash    Patient Measurements: Height: 5\' 11"  (180.3 cm) Weight: 181 lb 4.8 oz (82.237 kg) IBW/kg (Calculated) : 75.3  Vital Signs: Temp: 98.8 F (37.1 C) (01/27 0539) Temp Source: Oral (01/27 0539) BP: 128/61 mmHg (01/27 0539) Pulse Rate: 110 (01/27 0539) Intake/Output from previous day: 01/26 0701 - 01/27 0700 In: 975 [I.V.:975] Out: 450 [Urine:450] Intake/Output from this shift: Total I/O In: 120 [P.O.:120] Out: 175 [Urine:175]  Labs:  Recent Labs  08/11/14 1644 08/12/14 0637  WBC 8.5 6.5  HGB 12.2* 10.7*  PLT 297 256  CREATININE 0.96 0.67   Estimated Creatinine Clearance: 115 mL/min (by C-G formula based on Cr of 0.67). No results for input(s): VANCOTROUGH, VANCOPEAK, VANCORANDOM, GENTTROUGH, GENTPEAK, GENTRANDOM, TOBRATROUGH, TOBRAPEAK, TOBRARND, AMIKACINPEAK, AMIKACINTROU, AMIKACIN in the last 72 hours.   Microbiology: No results found for this or any previous visit (from the past 720 hour(s)).  Medical History: Past Medical History  Diagnosis Date  . Diabetes mellitus without complication   . Stroke     expressive aphasia  . Hypertension    Assessment: Patient is a 54 y.o with hx DM admitted with gangrene and osteomyelitis of the foot and hand.  Rocephin and flagyl started on 1/26.  To start vancomycin today for gram positive coverage with plan to consult ID for further recommendations.  Goal of Therapy:  Vancomycin trough level 15-20 mcg/ml  Plan:  - vancomycin 1gm IV q8h - will check level at steady state -f/u cultures and recommendations from ID  Alise Calais P 08/12/2014,1:54 PM

## 2014-08-12 NOTE — Consult Note (Signed)
[facility] [place] [place] CONSULT [place]   MRN : [id number]  Reason for Consult: Right great toe dry gangrene, forefoot cellulitis.   History of Present Illness: 54 y/o male states he has had a wound on his right great toe for 1 month at this point.  He has no other complaints.  No fever, chills, unexplained weight loss.  Past medical history includes: DM, hypertension and hx of stroke.  He currently takes a daily 325 mg Asprin, statin and betablocker.  Poor historian.    Current Facility-Administered Medications  Medication Dose Route Frequency Provider Last Rate Last Dose  . 0.9 %  sodium chloride infusion   Intravenous Continuous Nani RavensAndrew M Wight, MD 75 mL/hr at 08/12/14 0134    . acetaminophen (TYLENOL) tablet 650 mg  650 mg Oral Q6H PRN Nani RavensAndrew M Wight, MD       Or  . acetaminophen (TYLENOL) suppository 650 mg  650 mg Rectal Q6H PRN Nani RavensAndrew M Wight, MD      . aspirin tablet 325 mg  325 mg Oral Daily Nani RavensAndrew M Wight, MD   325 mg at 08/11/14 2115  . atorvastatin (LIPITOR) tablet 40 mg  40 mg Oral Q breakfast Nani RavensAndrew M Wight, MD      . cefTRIAXone (ROCEPHIN) 2 g in dextrose 5 % 50 mL IVPB  2 g Intravenous Q24H Hannah Muthersbaugh, PA-C 100 mL/hr at 08/11/14 2047 2 g at 08/11/14 2047   And  . metroNIDAZOLE (FLAGYL) IVPB 500 mg  500 mg Intravenous Q8H Hannah Muthersbaugh, PA-C 100 mL/hr at 08/12/14 0830 500 mg at 08/12/14 0830  . heparin injection 5,000 Units  5,000 Units Subcutaneous 3 times per day Nani RavensAndrew M Wight, MD   5,000 Units at 08/11/14 2200  . Influenza vac split quadrivalent PF (FLUARIX) injection 0.5 mL  0.5 mL Intramuscular Tomorrow-1000 Todd D McDiarmid, MD      . insulin aspart (novoLOG) injection 0-9 Units  0-9 Units Subcutaneous TID WC Nani RavensAndrew M Wight, MD   2 Units at 08/12/14 0827  . metoprolol succinate (TOPROL-XL) 24 hr tablet 50 mg  50 mg Oral Daily Nani RavensAndrew M Wight, MD   50 mg at 08/11/14 2115  . ondansetron (ZOFRAN) tablet 4 mg  4 mg Oral Q6H PRN Nani RavensAndrew M Wight,  MD       Or  . ondansetron [facility](ZOFRAN) injection 4 mg  4 mg Intravenous Q6H PRN Nani RavensAndrew M Wight, MD      . oxyCODONE (Oxy IR/ROXICODONE) immediate release tablet 5 mg  5 mg Oral Q4H PRN Nani RavensAndrew M Wight, MD      . pneumococcal 23 valent vaccine (PNU-IMMUNE) injection 0.5 mL  0.5 mL Intramuscular Tomorrow-1000 Todd D McDiarmid, MD      . sodium chloride 0.9 % injection 3 mL  3 mL Intravenous Q12H Nani RavensAndrew M Wight, MD      . vitamin B-12 (CYANOCOBALAMIN) tablet 50 mcg  50 mcg Oral Daily Nani RavensAndrew M Wight, MD   50 mcg at 08/11/14 2115    Pt meds include: Statin :Yes Betablocker: Yes ASA: Yes Other anticoagulants/antiplatelets: none  Past Medical History  Diagnosis Date  . Diabetes mellitus without complication   . Stroke     expressive aphasia  . Hypertension     Past Surgical History  Procedure Laterality Date  . No past surgeries    . I&d extremity Left 12/04/2012    Procedure: IRRIGATION AND DEBRIDEMENT Left Hand with Ring Removal  times three, Carpal Tunnel , Flexor tendon Synovectomy;  Surgeon: [name], MD;  Location: [facility] OR;  Service: Orthopedics;  Laterality: Left;  . Amputation Left 12/06/2012    Procedure: AMPUTATION DIGIT LEFT INDEX;  Surgeon: [name], MD;  Location: [facility] OR;  Service: Orthopedics;  Laterality: Left;  . I&d extremity Left 12/06/2012    Procedure: IRRIGATION AND DEBRIDEMENT EXTREMITY LEFT HAND;  Surgeon: [name], MD;  Location: [facility] OR;  Service: Orthopedics;  Laterality: Left;  . Hernia repair      bilat ing age 18months  . Tendon repair Left 12/23/2012    Procedure: DIVISION AND INSETTING FLAP TO LEFT RING FINGER ADJACENT TISSUE REARRANGEMENT LEFT INDEX FINGER AMPUTATION SITE;  Surgeon: [name], MD;  Location: [facility];  Service: Orthopedics;  Laterality: Left;    Social History History  Substance Use Topics  . Smoking status: Never Smoker   . Smokeless tobacco: Never Used  . Alcohol Use: 4.8 oz/week    8 Cans of  beer per week     Comment: 8-12 beers qd     Family History History reviewed. No pertinent family history.  Allergies  Allergen Reactions  . Unasyn [Ampicillin-Sulbactam Sodium] Rash     REVIEW OF SYSTEMS  General:  Weight loss,  Fever,  chills Neurologic:  Dizziness,  Blackouts,  Seizure  Stroke,  "Mini stroke",  Slurred speech,  Temporary blindness;  weakness in arms or legs,  Hoarseness  Dysphagia Cardiac:  Chest pain/pressure,  Shortness of breath at rest  Shortness of breath with exertion,  Atrial fibrillation or irregular heartbeat  Vascular: [x ] Pain in legs with walking,  Pain in legs at rest,  Pain in legs at night,   Non-healing ulcer,  Blood clot in vein/DVT,   Pulmonary:  Home oxygen,  Productive cough,  Coughing up blood,  Asthma,   Wheezing  COPD Musculoskeletal:   Arthritis,  Low back pain,  Joint pain Hematologic:  Easy Bruising,  Anemia;  Hepatitis Gastrointestinal:  Blood in stool,  Gastroesophageal Reflux/heartburn, Urinary:  chronic Kidney disease,  on HD -  MWF or  TTHS,  Burning with urination,  Difficulty urinating Skin:  Rashes, [x ] Wounds Psychological:  Anxiety,  Depression  Physical Examination Filed Vitals:   08/11/14 2048 08/11/14 2106 08/11/14 2112 08/12/14 0539  BP:   130/97 128/61  Pulse:   105 110  Temp: 98.4 F (36.9 C)  99.1 F (37.3 C) 98.8 F (37.1 C)  TempSrc: Oral  Oral Oral  Resp:   20 16  Height:   (1.803 m)    Weight:  181 lb 4.8 oz (82.237 kg)    SpO2:   99% 97%   Body mass index is 25.3 kg/(m^2).  General:  WDWN in NAD HENT: WNL Eyes: Pupils equal Pulmonary: normal non-labored breathing , without Rales, rhonchi,  wheezing Cardiac: sinus tachycardia, without  Murmurs, rubs or gallops; No carotid bruits Abdomen: soft, NT, no masses Skin: no rashes, ulcers noted;  no Gangrene , no  cellulitis; no open wounds;   Vascular Exam/Pulses:Palpable femoral pulses bilateral, doppler DP/PT bilaterally   Right great toe dry gangrene, with forefoot cellulitis below the open wound at the base of the great toe.  Plantar first metatarsal head wound  with plantar cellulitis.     Musculoskeletal: no muscle wasting or atrophy; no edema.  Missing digits on the hands secondary to infection.  Neurologic: A&O X 3; Appropriate Affect ;  SENSATION: normal; MOTOR FUNCTION: 5/5 Symmetric Speech is fluent/normal   Significant Diagnostic Studies: CBC Lab Results  Component Value Date   WBC 6.5 08/12/2014   HGB 10.7* 08/12/2014   HCT 32.6* 08/12/2014   MCV 88.3 08/12/2014   PLT 256 08/12/2014    BMET    Component Value Date/Time   NA 136 08/12/2014 0637   K 4.2 08/12/2014 0637   CL 105 08/12/2014 0637   CO2 22 08/12/2014 0637   GLUCOSE 224* 08/12/2014 0637   BUN 8 08/12/2014 0637   CREATININE 0.67 08/12/2014 0637   CREATININE 0.81 04/02/2014 1432   CALCIUM 7.9* 08/12/2014 0637   GFRNONAA >90 08/12/2014 0637   GFRAA >90 08/12/2014 1610   Estimated Creatinine Clearance: 115 mL/min (by C-G formula based on Cr of 0.67).  COAG No results found for: INR, PROTIME   Non-Invasive Vascular Imaging: pending ABI's  ASSESSMENT/PLAN:  Dry gangrene right great toe with forefoot cellulitis DM peripheral neuropathy He does have doppler monophasic signals DP/PT on bilateral feet. Pending ABI's we may have to perform an angiogram to better delineate his arterial flow. Currently on heparin 5000 Rowan TID   Clinton Gallant [facility] 08/12/2014 8:49 AM  Agree with above assessment Patient with gangrene right first toe and cellulitis dorsum of distal half of foot. 3+ femoral pulse palpable with 0-1+ popliteal at most palpable on right-no distal pulses Left leg with 3+ femoral 3+ popliteal pulse palpable no distal pulses.  Suspect comminution of superficial femoral-popliteal and tibial  occlusive disease which may or may not be amenable to revascularization   Plan angiography by Dr. Imogene Burn tomorrow with possible intervention right leg if feasible If percutaneous intervention not feasible we'll plan surgical bypass on Friday if he is candidate for revascularization Discussed situation with Dr. Lajoyce Corners who plans open debridement and/or distal amputation of right foot on Friday following are revascularization procedure or independently depending on the above findings  Discussed the situation at length with patient. He does have a aphasia but does seem to understand the plan and the fact that he is at high risk for limb loss

## 2014-08-12 NOTE — Discharge Summary (Signed)
[facility] Discharge Summary  Patient name: Stephen Fitzgerald Medical record number: [id number] Date of birth: 01-07-1961 Age: 54 y.o. Gender: male Date of Admission: 08/11/2014  Date of Discharge: 08/17/2014 Admitting Physician: Blane Ohara McDiarmid, MD  Primary Care Provider: Chrisandra Netters, MD Consultants: Ortho, vascular surgery  Indication for Hospitalization: osteomyelitis  Discharge Diagnoses/Problem List:   R great toe and L 4th digit osteomyelitis  R great toe and L 4th digit amputation V6HM Systolic Heart murmur - Echo normal EF, mild aortic stenosis.  HTN HLD Sebaceous cyst of R upper back  Disposition: To SNF [facility])  Discharge Condition: Stable  Discharge Exam:  General: NAD, AAOx3, comfortable HEENT: PERRL, EOMI, MMM Cardiovascular: RRR, normal C9O7, systolic ejection murmur present 3/6. 2+ radial pulses. Left PT and DP pulses 1+, unable to palpate right PT and DP 2/2 bandage. Respiratory: clear bilaterally, normal effort Abdomen: soft, nontender, nondistended, normal bowel sounds Extremities: edema improved. Bandage over surgical wound of R foot c/d/i. Left 4th digit with bandage covering surgical site c/d/i  Neuro: alert, aphasia at baseline/ intermittent word salad, understands language and questions well. No focal deficits otherwise noted   Brief Hospital Course:  Stephen Fitzgerald is a 55 y.o. male presenting with osteomyelitis and sepsis. 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  # Right great toe and Left 4th digit osteomyelitis: Patient was sent to the ED after being seen in orthopedic office due to concern for osteomyelitis of left fourth digit and right great toe. Sepsis criteria met on admission by tachycardia, fever, foot/hand as source, patient's vitals improved after admission and initiation of antibiotics, were subsequently stable throughout  admission. Plain films demonstrate bony destruction 4th DIP consistent with osteomyelitis, fracture base of right great toe and extensive soft tissue infection with gas, bony destruction medial margin head of first metatarsal. Pt started on ceftriaxone and flagyl in ED for severe diabetic foot without risk factors for MRSA or pseudomonas, but Vancomycin was added on admission to cover for these organisms. Blood cultures from admission grew Coag Negative Staph in 1/2 bottles for which sensitivities were pending at discharge. Orthopedics followed patient throughout admission. Vascular surgery was counseled on day after admission as multiple providers unable to find pulse and right foot. Angiography of right leg on 1/28 showed intact blood flow to the right foot, therefore arterial insufficiency not the etiology of patient's wet gangrene. ABIs of right leg falsely elevated secondary to calcified vessels. Patient underwent amputation on 1/30. No bone biopsy / culture obtained at that time. Postoperatively, he did very well. Orthopedics felt comfortable with discharge on post op day 2. Instructions to leave bandage in place x 1 week until follow up in their office. Weight bearing as tolerated.   # T2DM: A1c 11.6 on admission, poorly controlled. Home metformin held in setting of need to use dye for procedures. Managed with SSI and started on Lantus, which was up-titrated as needed. At discharge, pt required 10 units of lantus daily and resistant SSI.   # Systolic Heart murmur: First recognized in 07/2013 at office visit.  Noted during admission and had not had work-up for it previously.  Echo showed Mild aortic stenosis with preserved ejection fraction.   # Probable sebaceous cyst right upper back: Noted on admission PE.  Patient declined any intervention.  All other chronic medical conditions stable throughout admission and managed with home regimens.  Issues for Follow Up:  -  Follow up PT with weight bearing  as tolerated.  - Orthopedic follow up in 1 week. Leave bandages in place until orthopedic follow up.  - Follow up glucose control CBG's 170's - 240's prior to discharge in the setting of recent infection and operation. Increased lantus to 10 units, and on resistant SSI. Last A1C 11.6.  - Follow up blood culture sensitivities.   Significant Procedures: Right great toe and left 4th digit amputation. See orthopedic op note for details.   Significant Labs and Imaging:   Recent Labs Lab 08/13/14 0917 08/14/14 0500 08/16/14 0310  WBC 6.1 5.6 8.1  HGB 10.9* 10.9* 10.7*  HCT 33.2* 33.4* 33.0*  PLT 265 291 305    Recent Labs Lab 08/11/14 1644 08/12/14 0637 08/13/14 0917 08/14/14 0500  NA 135 136 138 140  K 5.1 4.2 4.3 4.3  CL 99 105 105 109  CO2 23 22 24 24   GLUCOSE 373* 224* 253* 239*  BUN 14 8 <5* 6  CREATININE 0.96 0.67 0.58 0.65  CALCIUM 8.7 7.9* 8.0* 8.2*  ALKPHOS 126*  --   --   --   AST 15  --   --   --   ALT 13  --   --   --   ALBUMIN 2.5*  --   --   --     Lactic acid 1.09  Hgb A1c 11.6  Imaging/Diagnostic Tests: Dg Chest 1 View (1/26): No active disease.  Dg Hand Complete Left (1/26): Bony destruction about the fourth DIP, suspicious for osteomyelitis  Dg Foot Complete Right (1/26): Minimally displaced fracture at base of proximal phalanx RIGHT great toe extending intra-articular at first MTP joint. Extensive soft tissue infection by gas-forming organism extending from the level of the distal first metatarsal to the base of the distal phalanx great toe. Osteomyelitis with bone destruction at the medial margin of the head of the first metatarsal.   Results/Tests Pending at Time of Discharge:  - Sensitivities for blood culture with coag negative staph.   Discharge Medications:    Medication List    TAKE these medications        aspirin 325 MG tablet  Take 325 mg by mouth daily.     atorvastatin 40 MG tablet  Commonly known as:  LIPITOR  TAKE  1 TABLET BY MOUTH DAILY     insulin glargine 100 UNIT/ML injection  Commonly known as:  LANTUS  Inject 0.1 mLs (10 Units total) into the skin daily.     metFORMIN 500 MG 24 hr tablet  Commonly known as:  GLUCOPHAGE-XR  TAKE 2 TABLETS BY MOUTH DAILY WITH BREAKFAST     metoprolol succinate 50 MG 24 hr tablet  Commonly known as:  TOPROL-XL  TAKE 1 TABLET BY MOUTH DAILY     oxyCODONE-acetaminophen 5-325 MG per tablet  Commonly known as:  PERCOCET/ROXICET  Take 1-2 tablets by mouth every 4 (four) hours as needed for moderate pain.     vitamin B-12 100 MCG tablet  Commonly known as:  CYANOCOBALAMIN  Take 50 mcg by mouth daily.        Discharge Instructions: Please refer to Patient Instructions section of EMR for full details.  Patient was counseled important signs and symptoms that should prompt return to medical care, changes in medications, dietary instructions, activity restrictions, and follow up appointments.   Follow-Up Appointments: Follow-up Information    Follow up with Mcarthur Rossetti, MD. Schedule an appointment as soon as possible for a visit  in 1 week.   Specialty:  Orthopedic Surgery   Contact information:   [place] [address] [phone]       Aquilla Hacker, MD 08/17/2014, 2:04 PM PGY-1, [place]

## 2014-08-12 NOTE — Progress Notes (Signed)
Patient admitted to unit via stretcher to [phone]. Pt alert and oriented x 4. Father at bedside. Belongings at bedside. IV intact. Pt in no distress. Wound present to right foot and left hand ring finger. Pt states ok for Dad Stephen Fitzgerald to be contacted about patient's plan of care [phone]. Call bell within reach . Pt understands how to get into contact with nursing staff. Awaiting MD orders.

## 2014-08-12 NOTE — Progress Notes (Signed)
Family Medicine Teaching Service Daily Progress Note Intern Pager: [phone]  Patient name: Stephen Fitzgerald Medical record number: [id number] Date of birth: [birth year] Age: 54 y.o. Gender: male  Primary Care Provider: Levert FeinsteinMcIntyre, Brittany, MD Consultants: Ortho Code Status: full  Pt Overview and Major Events to Date:  1/26 - admit for osteomyelitis  Assessment and Plan:  Stephen Fitzgerald is a 54 y.o. male presenting with osteomyelitis and sepsis. 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 Seen by ortho hand today in office and sent to ED. Sepsis criteria on admission by tachycardia, fever, foot/hand as source. Pt vitals have improved, now afebrile and tachycardia improved; does not appear clinically ill on exam. ED workup significant for normal lactic acid 1.82 and 1.09, WBC 8.5, electrolytes wnl. Plain films demonstrate bony destruction 4th DIP consistent with osteomyelitis, fracture base of right great toe and extensive soft tissue infection with gas, bony destruction medial margin head of first metatarsal. Pt started on ceftriaxone and flagyl in ED for severe diabetic foot without risk factors for MRSA or pseudomonas. Coverage also not ideal for c. Perfringens but does not appear to be gas gangrene and more likely mixed aerobic/anerobic organisms. - F/u blood and urine cultures - continue ceftriaxone, flagyl. If worsens will broaden for pseudomonas and MRSA coverage - consults to orthopedics, appreciate recs and assistance in management - ABI / Toe brachial index ordered - wound care consult - will defer to ortho/vasc - Will consult vascular today for pulseless foot - apap and oxyIR for pain control  # T2DM: poorly controlled. A1c 11.6 on admission - CBG qac qhs - SSI, sensitive  # HTN: Currently normotensive. - continue  toprol xl 50mg   # HLD: Continue atorva  # Probable sebaceous cyst right upper back: - pt declines any intervention for this  FEN/GI: carb mod diet / NS 75 cc/hr Prophylaxis: heparin sq  Disposition: Pending clinical improvement, ortho/VVS recs  Subjective:  Wants to take medications more grouped together in the AM.  Agrees with waiting on Ortho recs.  Objective: Temp:  [98.4 F (36.9 C)-101.2 F (38.4 C)] 98.8 F (37.1 C) (01/27 0539) Pulse Rate:  [105-132] 110 (01/27 0539) Resp:  [12-20] 16 (01/27 0539) BP: (109-130)/(56-97) 128/61 mmHg (01/27 0539) SpO2:  [94 %-100 %] 97 % (01/27 0539) Weight:  [181 lb 4.8 oz (82.237 kg)-200 lb (90.719 kg)] 181 lb 4.8 oz (82.237 kg) (01/26 2106) Physical Exam: General: NAD HEENT: PERRL, EOMI, MMM Cardiovascular: tachycardic, regular rhythm, normal s1s2, systolic murmur present. 2+ radial pulses. Left PT and DP pulses 2+, unable to palpate right PT and DP. Respiratory: clear bilaterally, normal effort Abdomen: soft, nontender, nondistended, normal bowel sounds Extremities: pitting edema right foot and ankle. Right foot: great toe black and necrotic down into area of distal metatarsal, erythematous and swollen to mid foot, tender up to lower part of ankle. Left 4th digit swollen, tender, necrotic DIP (SEE PICTURES in H+P) Neuro: alert, aphasia at baseline/word salad, understands language and questions well. No focal deficits otherwise noted  Laboratory:  Recent Labs Lab 08/11/14 1644 08/12/14 0637  WBC 8.5 6.5  HGB 12.2* 10.7*  HCT 36.6* 32.6*  PLT 297 256    Recent Labs Lab 08/11/14 1644 08/12/14 0637  NA 135 136  K 5.1 4.2  CL 99 105  CO2 23 22  BUN 14  8  CREATININE 0.96 0.67  CALCIUM 8.7 7.9*  PROT 7.5  --   BILITOT 0.8  --   ALKPHOS 126*  --   ALT 13  --   AST 15  --   GLUCOSE 373* 224*    Lactic acid 1.09  Hgb A1c 11.6  Imaging/Diagnostic Tests: Dg Chest 1 View (1/26):  No active disease.  Dg Hand  Complete Left (1/26):  Bony destruction about the fourth DIP, suspicious for osteomyelitis  Dg Foot Complete Right (1/26):  Minimally displaced fracture at base of proximal phalanx RIGHT great toe extending intra-articular at first MTP joint. Extensive soft tissue infection by gas-forming organism extending from the level of the distal first metatarsal to the base of the distal phalanx great toe. Osteomyelitis with bone destruction at the medial margin of the head of the first metatarsal.   Shirlee Latch, MD 08/12/2014, 8:22 AM PGY-1, [facility] Family Medicine FPTS Intern pager: [phone], text pages welcome

## 2014-08-12 NOTE — Progress Notes (Signed)
[facility] lab  called with positive blood cultures in aerobic bottle, gram positive cocci and clusters. MD paged to make aware.

## 2014-08-12 NOTE — Progress Notes (Signed)
PATIENT ID: Lamar SprinklesJeffrey W Kafer  MRN: [id number]  DOB/AGE:  17-Jul-1961 / 54 y.o.   With regard to left ring finger wound, continue moist dressings until plan for R LE is more fully developed, so I might coordinate L UE surgical care with the R LE care plan.  Cliffton Astersavid A. Janee Mornhompson, MD      Orthopaedic & Hand Surgery [facility] [address] [place], [place]  [id number] Office: [phone] Mobile: [phone]  08/12/2014, 9:07 AM

## 2014-08-12 NOTE — Progress Notes (Signed)
Inpatient Diabetes Program Recommendations  AACE/ADA: New Consensus Statement on Inpatient Glycemic Control (2013)  Target Ranges:  Prepandial:   less than 140 mg/dL      Peak postprandial:   less than 180 mg/dL (1-2 hours)      Critically ill patients:  140 - 180 mg/dL   Reason for Visit"  Referral received.  A1C elevated.  Results for Stephen Fitzgerald, Stephen Fitzgerald (MRN [id number]) as of 08/12/2014 10:56  Ref. Range 08/11/2014 19:11  Hgb A1c MFr Bld Latest Range: <5.7 % 11.6 (H)  Results for Stephen Fitzgerald, Stephen Fitzgerald (MRN [id number]) as of 08/12/2014 10:56  Ref. Range 08/11/2014 21:19 08/12/2014 08:07  Glucose-Capillary Latest Range: 70-99 mg/dL 782297 (H) 956192 (H)   Diabetes history: Type 2 diabetes Outpatient Diabetes medications: Metformin 1000 mg q AM Current orders for Inpatient glycemic control:  Novolog sensitive tid with meals  Spoke with patient briefly regarding elevated A1C.  When I mentioned his elevated CBG's, he was adamant that he would not take insulin at home.  He also made several comments regarding wounds and that since his toe was "Itching", he felt it was healing from the inside out.  Very difficult to talk to patient about his diabetes as he states "I don't eat regularly" and he also mentions that he feels "full" from the beer that he drinks.  Attempted to explain the importance of blood glucose control for healing, and he states "I will take insulin here but not at home".  Very difficult patient.    May consider adding low dose basal while patient is in the hospital.  Consider adding Lantus 12 units q HS.  Discussed with RN.  Thanks, Beryl MeagerJenny Haydee Jabbour, RN, BC-ADM Inpatient Diabetes Coordinator Pager [phone]

## 2014-08-12 NOTE — Consult Note (Signed)
WOC consulted, however reviewed this patient charts and imaged from ED admission.  Pt has been evaluated per Dr. Lajoyce Cornersuda this am for the foot wound and he has recommended dry dressings until vascular can evaluate the pulseless extremity.  Pt is followed by Dr. Janee Mornhompson for his hand wounds and it is noted evaluation scheduled for this am for that wound with Dr. Janee Mornhompson.  Discussed POC with patient and bedside nurse.  Re consult if needed, will not follow at this time. Thanks  Burlene Montecalvo [employer] RN, CWOCN [phone])

## 2014-08-12 NOTE — Progress Notes (Signed)
INITIAL NUTRITION ASSESSMENT  DOCUMENTATION CODES Per approved criteria  -Not Applicable   INTERVENTION: 30 ml Prostat TID  NUTRITION DIAGNOSIS: Increased nutrient needs related to wound healing as evidenced by estimated needs.   Goal: Pt will meet >90% of estimated nutritional needs  Monitor:  PO/supplement intake, labs, weight changes, I/O's  Reason for Assessment: Consult for wound healing  54 y.o. male  Admitting Dx: <principal problem not specified>  Stephen Fitzgerald is a 54 y.o. male presenting with osteomyelitis and sepsis. 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  ASSESSMENT: Attempted to visit pt multiple times, but pt min with RN and MD at times of multiple visits.  Pt with hx of DM with poor compliance. Per DM coordinator, pt does not follow a regular schedule and is reluctant to start insulin home regimen. Pt admitted with rt DM foot ulcer with gangrene; awaiting angiography and with high risk for amputation. Appetite is good; PO: 100%. RD will add protein modular to assist with wound healing and increased nutrient needs.  Labs reviewed. Calcium: 7.7, Glucose: 224, CBGS: 192-297. Hgb A1c: 11.6.   Height: Ht Readings from Last 1 Encounters:  08/11/14  (1.803 m)    Weight: Wt Readings from Last 1 Encounters:  08/11/14 181 lb 4.8 oz (82.237 kg)    Ideal Body Weight: 172#  % Ideal Body Weight: 105%  Wt Readings from Last 10 Encounters:  08/11/14 181 lb 4.8 oz (82.237 kg)  04/02/14 199 lb 9.6 oz (90.538 kg)  12/03/13 212 lb 11.2 oz (96.48 kg)  07/23/13 199 lb 4.8 oz (90.402 kg)  03/06/13 177 lb 3.2 oz (80.377 kg)  02/06/13 175 lb 6.4 oz (79.561 kg)  01/22/13 179 lb (81.194 kg)  01/07/13 174 lb (78.926 kg)  12/25/12 170 lb (77.111 kg)  12/23/12 166 lb 12.8 oz (75.66 kg)    Usual Body Weight: 175#  % Usual Body Weight: 103%  BMI:  Body mass index is 25.3 kg/(m^2).  Overweight  Estimated Nutritional Needs: Kcal: 2200-2400 Protein: 105-115 grams Fluid: 2.2-2.2 L  Skin: rt food DM ulcer, lt ring finger wound  Diet Order:  Carb Modified  EDUCATION NEEDS: -Education not appropriate at this time   Intake/Output Summary (Last 24 hours) at 08/12/14 0930 Last data filed at 08/12/14 0719  Gross per 24 hour  Intake    975 ml  Output    625 ml  Net    350 ml    Last BM: 08/11/14  Labs:   Recent Labs Lab 08/11/14 1644 08/12/14 0637  NA 135 136  K 5.1 4.2  CL 99 105  CO2 23 22  BUN 14 8  CREATININE 0.96 0.67  CALCIUM 8.7 7.9*  GLUCOSE 373* 224*    CBG (last 3)   Recent Labs  08/11/14 2119 08/12/14 0807  GLUCAP 297* 192*   Lab Results  Component Value Date   HGBA1C 11.6* 08/11/2014   Scheduled Meds: . aspirin  325 mg Oral Daily  . atorvastatin  40 mg Oral Q breakfast  . cefTRIAXone (ROCEPHIN)  IV  2 g Intravenous Q24H   And  . metronidazole  500 mg Intravenous Q8H  . heparin  5,000 Units Subcutaneous 3 times per day  . Influenza vac split quadrivalent PF  0.5 mL Intramuscular Tomorrow-1000  . insulin aspart  0-9 Units Subcutaneous TID WC  . metoprolol succinate  50 mg Oral Daily  . pneumococcal 23 valent vaccine  0.5 mL Intramuscular Tomorrow-1000  . sodium chloride  3 mL Intravenous Q12H  . vitamin B-12  50 mcg Oral Daily    Continuous Infusions: . sodium chloride 75 mL/hr at 08/12/14 0134    Past Medical History  Diagnosis Date  . Diabetes mellitus without complication   . Stroke     expressive aphasia  . Hypertension     Past Surgical History  Procedure Laterality Date  . No past surgeries    . I&d extremity Left 12/04/2012    Procedure: IRRIGATION AND DEBRIDEMENT Left Hand with Ring Removal  times three, Carpal Tunnel , Flexor tendon Synovectomy;  Surgeon: Jodi Marbleavid A Thompson, MD;  [facility];  Service: Orthopedics;  Laterality: Left;  . Amputation Left 12/06/2012    Procedure: AMPUTATION DIGIT LEFT  INDEX;  Surgeon: Jodi Marbleavid A Thompson, MD;  [facility];  Service: Orthopedics;  Laterality: Left;  . I&d extremity Left 12/06/2012    Procedure: IRRIGATION AND DEBRIDEMENT EXTREMITY LEFT HAND;  Surgeon: Jodi Marbleavid A Thompson, MD;  [facility];  Service: Orthopedics;  Laterality: Left;  . Hernia repair      bilat ing age 18months  . Tendon repair Left 12/23/2012    Procedure: DIVISION AND INSETTING FLAP TO LEFT RING FINGER ADJACENT TISSUE REARRANGEMENT LEFT INDEX FINGER AMPUTATION SITE;  Surgeon: Jodi Marbleavid A Thompson, MD;  Location: [facility];  Service: Orthopedics;  Laterality: Left;    Stephen Fitzgerald, RD, LDN, CDE Pager: [phone] After hours Pager: [phone]

## 2014-08-12 NOTE — Consult Note (Signed)
Reason for Consult: Gangrene right foot Referring Physician: Dr. Heath Gold is an 54 y.o. male.  HPI: Patient is a 54 year old gentleman diabetic insensate neuropathy status post stroke status post digital amputations who is seen for evaluation for gangrene of the right forefoot.  Past Medical History  Diagnosis Date  . Diabetes mellitus without complication   . Stroke     expressive aphasia  . Hypertension     Past Surgical History  Procedure Laterality Date  . No past surgeries    . I&d extremity Left 12/04/2012    Procedure: IRRIGATION AND DEBRIDEMENT Left Hand with Ring Removal  times three, Carpal Tunnel , Flexor tendon Synovectomy;  Surgeon: Jolyn Nap, MD;  Location: [place];  Service: Orthopedics;  Laterality: Left;  . Amputation Left 12/06/2012    Procedure: AMPUTATION DIGIT LEFT INDEX;  Surgeon: Jolyn Nap, MD;  Location: [place];  Service: Orthopedics;  Laterality: Left;  . I&d extremity Left 12/06/2012    Procedure: IRRIGATION AND DEBRIDEMENT EXTREMITY LEFT HAND;  Surgeon: Jolyn Nap, MD;  Location: [place];  Service: Orthopedics;  Laterality: Left;  . Hernia repair      bilat ing age 53month  . Tendon repair Left 12/23/2012    Procedure: DIVISION AND INSETTING FLAP TO LEFT RING FINGER ADJACENT TISSUE REARRANGEMENT LEFT INDEX FINGER AMPUTATION SITE;  Surgeon: DJolyn Nap MD;  [place] [facility]  Service: Orthopedics;  Laterality: Left;    History reviewed. No pertinent family history.  Social History:  reports that he has never smoked. He has never used smokeless tobacco. He reports that he drinks about 4.8 oz of alcohol per week. He reports that he does not use illicit drugs.  Allergies:  Allergies  Allergen Reactions  . Unasyn [Ampicillin-Sulbactam Sodium] Rash    Medications: I have reviewed the patient's current medications.  Results for orders placed or performed during the hospital encounter of 08/11/14  (from the past 48 hour(s))  C-reactive protein     Status: Abnormal   Collection Time: 08/11/14  4:37 PM  Result Value Ref Range   CRP 24.4 (H) <0.60 mg/dL    Comment: Performed at [facility] CBC WITH DIFFERENTIAL     Status: Abnormal   Collection Time: 08/11/14  4:44 PM  Result Value Ref Range   WBC 8.5 4.0 - 10.5 K/uL   RBC 4.21 (L) 4.22 - 5.81 MIL/uL   Hemoglobin 12.2 (L) 13.0 - 17.0 g/dL   HCT 36.6 (L) 39.0 - 52.0 %   MCV 86.9 78.0 - 100.0 fL   MCH 29.0 26.0 - 34.0 pg   MCHC 33.3 30.0 - 36.0 g/dL   RDW 12.6 11.5 - 15.5 %   Platelets 297 150 - 400 K/uL   Neutrophils Relative % 75 43 - 77 %   Neutro Abs 6.4 1.7 - 7.7 K/uL   Lymphocytes Relative 15 12 - 46 %   Lymphs Abs 1.3 0.7 - 4.0 K/uL   Monocytes Relative 8 3 - 12 %   Monocytes Absolute 0.6 0.1 - 1.0 K/uL   Eosinophils Relative 2 0 - 5 %   Eosinophils Absolute 0.1 0.0 - 0.7 K/uL   Basophils Relative 0 0 - 1 %   Basophils Absolute 0.0 0.0 - 0.1 K/uL  Comprehensive metabolic panel     Status: Abnormal   Collection Time: 08/11/14  4:44 PM  Result Value Ref Range   Sodium 135 135 - 145 mmol/L  Potassium 5.1 3.5 - 5.1 mmol/L   Chloride 99 96 - 112 mmol/L   CO2 23 19 - 32 mmol/L   Glucose, Bld 373 (H) 70 - 99 mg/dL   BUN 14 6 - 23 mg/dL   Creatinine, Ser 0.96 0.50 - 1.35 mg/dL   Calcium 8.7 8.4 - 10.5 mg/dL   Total Protein 7.5 6.0 - 8.3 g/dL   Albumin 2.5 (L) 3.5 - 5.2 g/dL   AST 15 0 - 37 U/L   ALT 13 0 - 53 U/L   Alkaline Phosphatase 126 (H) 39 - 117 U/L   Total Bilirubin 0.8 0.3 - 1.2 mg/dL   GFR calc non Af Amer >90 >90 mL/min   GFR calc Af Amer >90 >90 mL/min    Comment: (NOTE) The eGFR has been calculated using the CKD EPI equation. This calculation has not been validated in all clinical situations. eGFR's persistently <90 mL/min signify possible Chronic Kidney Disease.    Anion gap 13 5 - 15  Sedimentation rate     Status: Abnormal   Collection Time: 08/11/14  4:44 PM  Result Value Ref Range    Sed Rate 118 (H) 0 - 16 mm/hr  I-Stat CG4 Lactic Acid, ED (not at [facility])     Status: None   Collection Time: 08/11/14  5:05 PM  Result Value Ref Range   Lactic Acid, Venous 1.82 0.5 - 2.0 mmol/L  Urinalysis, Routine w reflex microscopic     Status: Abnormal   Collection Time: 08/11/14  6:10 PM  Result Value Ref Range   Color, Urine YELLOW YELLOW   APPearance CLEAR CLEAR   Specific Gravity, Urine 1.035 (H) 1.005 - 1.030   pH 5.0 5.0 - 8.0   Glucose, UA >1000 (A) NEGATIVE mg/dL   Hgb urine dipstick NEGATIVE NEGATIVE   Bilirubin Urine SMALL (A) NEGATIVE   Ketones, ur 15 (A) NEGATIVE mg/dL   Protein, ur NEGATIVE NEGATIVE mg/dL   Urobilinogen, UA 0.2 0.0 - 1.0 mg/dL   Nitrite NEGATIVE NEGATIVE   Leukocytes, UA NEGATIVE NEGATIVE  Urine microscopic-add on     Status: Abnormal   Collection Time: 08/11/14  6:10 PM  Result Value Ref Range   Squamous Epithelial / LPF RARE RARE   WBC, UA 0-2 <3 WBC/hpf   Bacteria, UA RARE RARE   Casts HYALINE CASTS (A) NEGATIVE  Hemoglobin A1c     Status: Abnormal   Collection Time: 08/11/14  7:11 PM  Result Value Ref Range   Hgb A1c MFr Bld 11.6 (H) <5.7 %    Comment: (NOTE)                                                                       According to the ADA Clinical Practice Recommendations for 2011, when HbA1c is used as a screening test:  >=6.5%   Diagnostic of Diabetes Mellitus           (if abnormal result is confirmed) 5.7-6.4%   Increased risk of developing Diabetes Mellitus References:Diagnosis and Classification of Diabetes Mellitus,Diabetes Care,2011,34(Suppl 1):S62-S69 and Standards of Medical Care in         Diabetes - 2011,Diabetes Care,2011,34 (Suppl 1):S11-S61.    Mean Plasma Glucose 286 (H) <117 mg/dL  Comment: Performed at [place] Lactic Acid, ED (not at [facility])     Status: None   Collection Time: 08/11/14  8:35 PM  Result Value Ref Range   Lactic Acid, Venous 1.09 0.5 - 2.0 mmol/L  Glucose,  capillary     Status: Abnormal   Collection Time: 08/11/14  9:19 PM  Result Value Ref Range   Glucose-Capillary 297 (H) 70 - 99 mg/dL   Comment 1 Documented in Chart    Comment 2 Notify RN   CBC     Status: Abnormal   Collection Time: 08/12/14  6:37 AM  Result Value Ref Range   WBC 6.5 4.0 - 10.5 K/uL   RBC 3.69 (L) 4.22 - 5.81 MIL/uL   Hemoglobin 10.7 (L) 13.0 - 17.0 g/dL   HCT 32.6 (L) 39.0 - 52.0 %   MCV 88.3 78.0 - 100.0 fL   MCH 29.0 26.0 - 34.0 pg   MCHC 32.8 30.0 - 36.0 g/dL   RDW 12.7 11.5 - 15.5 %   Platelets 256 150 - 400 K/uL    Dg Chest 1 View  08/11/2014   CLINICAL DATA:  Chest pain for 1 day, initial encounter  EXAM: CHEST - 1 VIEW  COMPARISON:  None.  FINDINGS: The heart size and mediastinal contours are within normal limits. Both lungs are clear. The visualized skeletal structures are unremarkable.  IMPRESSION: No active disease.   Electronically Signed   By: Inez Catalina M.D.   On: 08/11/2014 17:46   Dg Hand Complete Left  08/11/2014   CLINICAL DATA:  Fourth finger pain distally with erythema and swelling. Concern for osteomyelitis.  EXAM: LEFT HAND - COMPLETE 3+ VIEW  COMPARISON:  12/03/2012  FINDINGS: There is soft tissue swelling of the fourth digit. There is bony destruction at the ulnar aspect of the fourth middle phalanx and the fourth distal phalanx adjacent to the distal interphalangeal joint. This is suspicious for osteomyelitis in this clinical setting.  There is amputation of the second digit at the level of the metatarsal midshaft.  IMPRESSION: Bony destruction about the fourth DIP, suspicious for osteomyelitis   Electronically Signed   By: Andreas Newport M.D.   On: 08/11/2014 17:49   Dg Foot Complete Right  08/11/2014   CLINICAL DATA:  RIGHT great toe pain, black and, diabetes, hypertension, infection, question osteomyelitis  EXAM: RIGHT FOOT COMPLETE - 3+ VIEW  COMPARISON:  None.  FINDINGS: Diffuse soft tissue swelling greatest medially at distal RIGHT  foot extending to great toe.  Foci of soft tissue gas are identified at the medial plantar aspect of the RIGHT great toe beginning at the level of the first metatarsal head and extending through the base of the distal phalanx consistent with infection by gas-forming organism.  Additionally, large area bone destruction identified at the medial margin of the distal first metatarsal consistent with osteomyelitis.  Additionally, minimally displaced fracture is seen at base of proximal phalanx great toe.  This extends intra-articular at the first MTP joint.  Remaining bones demineralized but intact.  No additional fracture, dislocation or bone destruction.  Small vessel vascular calcifications.  IMPRESSION: Minimally displaced fracture at base of proximal phalanx RIGHT great toe extending intra-articular at first MTP joint.  Extensive soft tissue infection by gas-forming organism extending from the level of the distal first metatarsal to the base of the distal phalanx great toe.  Osteomyelitis with bone destruction at the medial margin of the head of the first metatarsal.  Electronically Signed   By: Lavonia Dana M.D.   On: 08/11/2014 17:49    Review of Systems  All other systems reviewed and are negative.  Blood pressure 128/61, pulse 110, temperature 98.8 F (37.1 C), temperature source Oral, resp. rate 16, height 5' 11" (1.803 m), weight 82.237 kg (181 lb 4.8 oz), SpO2 97 %. Physical Exam On examination patient does not have a palpable dorsalis pedis or posterior tibial pulse bilaterally. He has a good popliteal pulse on the left I cannot palpate a popliteal pulse on the right. He has a good femoral pulse on the right. Patient has dry gangrenous changes of the great toe with ulceration extending to the midfoot. Patient also has venous stasis changes bilaterally. No gangrenous changes in the hindfoot on the right. Assessment/Plan: Assessment: Dry gangrenous changes right foot without popliteal  pulse.  Plan: Recommend continue wound care dressing to the right foot with moist dressing change daily. Recommend vascular consult, patient may require revascularization for foot salvage intervention. Patient is not a candidate at this time for foot surgery until his vascular status is established. I will order ankle-brachial indices.  DUDA,MARCUS V 08/12/2014, 7:10 AM

## 2014-08-13 ENCOUNTER — Encounter (HOSPITAL_COMMUNITY): Payer: Self-pay | Admitting: Vascular Surgery

## 2014-08-13 ENCOUNTER — Encounter (HOSPITAL_COMMUNITY)
Admission: EM | Disposition: A | Discharge status: Skilled Nursing Facility | Payer: Self-pay | Source: Home / Self Care | Attending: Family Medicine

## 2014-08-13 DIAGNOSIS — R7881 Bacteremia: Secondary | ICD-10-CM | POA: Insufficient documentation

## 2014-08-13 DIAGNOSIS — R011 Cardiac murmur, unspecified: Secondary | ICD-10-CM

## 2014-08-13 DIAGNOSIS — E1152 Type 2 diabetes mellitus with diabetic peripheral angiopathy with gangrene: Secondary | ICD-10-CM

## 2014-08-13 DIAGNOSIS — I70261 Atherosclerosis of native arteries of extremities with gangrene, right leg: Secondary | ICD-10-CM

## 2014-08-13 HISTORY — PX: ABDOMINAL AORTAGRAM: SHX5454

## 2014-08-13 LAB — URINE CULTURE
Colony Count: NO GROWTH
Culture: NO GROWTH

## 2014-08-13 LAB — BASIC METABOLIC PANEL
Anion gap: 9 (ref 5–15)
CALCIUM: 8 mg/dL — AB (ref 8.4–10.5)
CO2: 24 mmol/L (ref 19–32)
CREATININE: 0.58 mg/dL (ref 0.50–1.35)
Chloride: 105 mmol/L (ref 96–112)
GFR calc non Af Amer: >90 mL/min (ref >90)
Glucose, Bld: 253 mg/dL — ABNORMAL HIGH (ref 70–99)
POTASSIUM: 4.3 mmol/L (ref 3.5–5.1)
Sodium: 138 mmol/L (ref 135–145)

## 2014-08-13 LAB — HIV ANTIBODY (ROUTINE TESTING W REFLEX): HIV SCREEN 4TH GENERATION: NONREACTIVE

## 2014-08-13 LAB — GLUCOSE, CAPILLARY
GLUCOSE-CAPILLARY: 170 mg/dL — AB (ref 70–99)
Glucose-Capillary: 324 mg/dL — ABNORMAL HIGH (ref 70–99)
Glucose-Capillary: 135 mg/dL — ABNORMAL HIGH (ref 70–99)

## 2014-08-13 LAB — CBC
HCT: 33.2 % — ABNORMAL LOW (ref 39.0–52.0)
Hemoglobin: 10.9 g/dL — ABNORMAL LOW (ref 13.0–17.0)
MCH: 29.3 pg (ref 26.0–34.0)
MCHC: 32.8 g/dL (ref 30.0–36.0)
MCV: 89.2 fL (ref 78.0–100.0)
Platelets: 265 10*3/uL (ref 150–400)
RBC: 3.72 MIL/uL — ABNORMAL LOW (ref 4.22–5.81)
RDW: 12.5 % (ref 11.5–15.5)
WBC: 6.1 10*3/uL (ref 4.0–10.5)

## 2014-08-13 LAB — PROTIME-INR
INR: 1.25 (ref 0.00–1.49)
Prothrombin Time: 15.8 seconds — ABNORMAL HIGH (ref 11.6–15.2)

## 2014-08-13 LAB — VANCOMYCIN, TROUGH: VANCOMYCIN TR: 12.6 ug/mL (ref 10.0–20.0)

## 2014-08-13 SURGERY — ABDOMINAL AORTAGRAM

## 2014-08-13 MED ORDER — ONDANSETRON HCL 4 MG/2ML IJ SOLN: 4.0000 mg | Freq: Four times a day (QID) | INTRAMUSCULAR | Status: DC | PRN

## 2014-08-13 MED ORDER — SODIUM CHLORIDE 0.9 % IV SOLN: 1.0000 mL/kg/h | INTRAVENOUS | Status: AC

## 2014-08-13 MED ORDER — INSULIN ASPART 100 UNIT/ML ~~LOC~~ SOLN
0.0000 [IU] | Freq: Three times a day (TID) | SUBCUTANEOUS | Status: DC
  Administered 2014-08-13: 2 [IU] via SUBCUTANEOUS
  Administered 2014-08-14: 8 [IU] via SUBCUTANEOUS
  Administered 2014-08-14 – 2014-08-15 (×4): 5 [IU] via SUBCUTANEOUS
  Administered 2014-08-16: 8 [IU] via SUBCUTANEOUS

## 2014-08-13 MED ORDER — HEPARIN (PORCINE) IN NACL 2-0.9 UNIT/ML-% IJ SOLN
INTRAMUSCULAR | Status: AC
  Filled 2014-08-13: qty 500

## 2014-08-13 MED ORDER — INSULIN GLARGINE 100 UNIT/ML ~~LOC~~ SOLN
5.0000 [IU] | Freq: Every day | SUBCUTANEOUS | Status: DC
  Administered 2014-08-13: 5 [IU] via SUBCUTANEOUS
  Filled 2014-08-13 (×3): qty 0.05

## 2014-08-13 MED ORDER — LIDOCAINE HCL (PF) 1 % IJ SOLN
INTRAMUSCULAR | Status: AC
  Filled 2014-08-13: qty 30

## 2014-08-13 MED ORDER — OXYCODONE-ACETAMINOPHEN 5-325 MG PO TABS: 1.0000 | ORAL_TABLET | ORAL | Status: DC | PRN

## 2014-08-13 MED ORDER — VANCOMYCIN HCL 10 G IV SOLR
1250.0000 mg | Freq: Three times a day (TID) | INTRAVENOUS | Status: DC
  Administered 2014-08-13 – 2014-08-15 (×6): 1250 mg via INTRAVENOUS
  Filled 2014-08-13 (×9): qty 1250

## 2014-08-13 MED ORDER — VANCOMYCIN HCL 10 G IV SOLR: 1250.0000 mg | Freq: Three times a day (TID) | INTRAVENOUS | Status: DC

## 2014-08-13 MED ORDER — MORPHINE SULFATE 2 MG/ML IJ SOLN: 2.0000 mg | INTRAMUSCULAR | Status: DC | PRN

## 2014-08-13 MED ORDER — ACETAMINOPHEN 325 MG PO TABS: 650.0000 mg | ORAL_TABLET | ORAL | Status: DC | PRN

## 2014-08-13 NOTE — Progress Notes (Signed)
PT Cancellation Note  Patient Details Name: Lamar SprinklesJeffrey W Brosseau MRN: [id number] DOB: Jan 31, 1961   Cancelled Treatment:    Reason Eval/Treat Not Completed: Patient at procedure or test/unavailable.  Pt has been taken to Cath lab.  Will follow up for Evaluation when appropriate. 08/13/2014  Edgerton BingKen Wyett Narine, PT [phone]  (pager)

## 2014-08-13 NOTE — Progress Notes (Signed)
Patient ID: Stephen SprinklesJeffrey W Fitzgerald, male   DOB: February 27, 1961, 54 y.o.   MRN: [id number] Dr. Lajoyce Cornersuda asked if I could assume care of [name]' right foot at this point since he is now out of town.  He has poor blood flow to that foot and a necrotic first ray.  He is in need of a right foot first ray amputation, and, more likely, a right foot transmetatarsal amputation.  He understands this fully.  I will need to set this surgery up for this Saturday am due to the surgery schedule.  He also has a left hand infection and will require a finger amputation thru his middle phalanx of his left ring finger due to infection.  Dr. Mack Hookavid Thompson was planning to do this surgery in conjunction with the foot surgery so everything could be done under one OR trip and one anesthesia, which is certainly in the patient's best interests.  However, due to the difficulty in getting our schedules together, Dr. Janee Mornhompson is fine with me taking care of [name] hand as well.  I am comfortable with this and I spoke to the patient in great length about it as well.  The patient understands this completely and does agree.

## 2014-08-13 NOTE — Op Note (Addendum)
OPERATIVE NOTE   PROCEDURE: 1.  Left common femoral artery cannulation under ultrasound guidance 2.  Placement of catheter in aorta 3.  Aortogram 4.  Second order arterial selection 5.  Right leg runoff via catheter 6.  Left leg runoff via sheath  PRE-OPERATIVE DIAGNOSIS: Right foot wet gangrene  POST-OPERATIVE DIAGNOSIS: same as above   SURGEON: Leonides SakeBrian Christie Viscomi, MD  ANESTHESIA: conscious sedation  ESTIMATED BLOOD LOSS: 50 cc  CONTRAST: 125 cc  FINDING(S):  Aorta:  patent  Superior mesenteric artery: patent Celiac artery: patent   Right Left  RA Patent, co-dominant pole arteries Patent, co-dominant pole arteries  CIA Patent Patent  EIA Patent Patent  IIA Patent Patent  CFA Patent Patent  SFA Patent Patent  PFA Patent Patent  Pop Patent Patent  Trif Patent Patent  AT Patent Patent, difficult to visualize distally due to movement  Pero Patent Patent, difficult to visualize distally due to movement  PT Dwindles after takeoff Not well visualized  R foot: blood flow down to toes  SPECIMEN(S):  none  INDICATIONS:   Stephen SprinklesJeffrey W Fitzgerald is a 54 y.o. male who presents with right foot wet gangrene.  The patient presents for: aortogram, bilateral leg runoff, and possible intervention Right leg.  I discussed with the patient the nature of angiographic procedures, especially the limited patencies of any endovascular intervention.  The patient is aware of that the risks of an angiographic procedure include but are not limited to: bleeding, infection, access site complications, renal failure, embolization, rupture of vessel, dissection, possible need for emergent surgical intervention, possible need for surgical procedures to treat the patient's pathology, and stroke and death.  The patient is aware of the risks and agrees to proceed.  DESCRIPTION: After full informed consent was obtained from the patient, the patient was brought back to the angiography suite.  The patient was  placed supine upon the angiography table and connected to monitoring equipment.  The patient was then given conscious sedation, the amounts of which are documented in the patient's chart.  The patient was prepped and drape in the standard fashion for an angiographic procedure.  At this point, attention was turned to the left groin.  Under ultrasound guidance, the left common femoral artery was cannulated with a micropuncture needle.  The microwire was advanced into the iliac arterial system.  The needle was exchanged for a microsheath, which was loaded into the common femoral artery over the wire.  The microwire was exchanged for a [facility] wire which was advanced into the aorta.  The microsheath was then exchanged for a 5-Fr sheath which was loaded into the common femoral artery.  The Omniflush catheter was then loaded over the wire up to the level of L1.  The catheter was connected to the power injector circuit.  After de-airring and de-clotting the circuit, a power injector aortogram was completed.  The findings are as listed above.  The [facility] wire was replaced in the catheter, and using the [facility] and Omniflush catheter, the right common iliac artery was selected.  The catheter and wire were advanced into the external iliac artery.  An automated right leg runoff was completed after de-airring and de-clotting the circuit.  The patient moved during the imaging, so a tibial injection and lateral foot was necessary to complete the imaging.  The findings are as listed above.  The [facility] wire was replaced in the catheter to straighten the crook of the catheter.  Both were removed together from the sheath.  The left sheath was aspirated and no clot was present.  The sheath was connected to the power injector circuit.  An automated left leg runoff was completed after de-airring and de-clotting the circuit.  The findings are as listed above.  The sheath was aspirated.  No clots were present and the sheath was reloaded  with heparinized saline.  Based on the images, this patient needs: no endovascular or open surgical interventions.  The patient has evidence of perfusion of his right foot with intact blood flow, suggesting that arterial insufficiency is NOT the underlying etiology for his wet gangrene.  COMPLICATIONS: none  CONDITION: stable   Leonides Sake, MD [facility] Office: [phone] Pager: [phone]  08/13/2014, 2:27 PM

## 2014-08-13 NOTE — H&P (View-Only) (Signed)
[facility]IN [facility]ISTS OF  CONSULT NOTE   MRN : [id number]  Reason for Consult: Right great toe dry gangrene, forefoot cellulitis.   History of Present Illness: 54 y/o male states he has had a wound on his right great toe for 1 month at this point.  He has no other complaints.  No fever, chills, unexplained weight loss.  Past medical history includes: DM, hypertension and hx of stroke.  He currently takes a daily 325 mg Asprin, statin and betablocker.  Poor historian.    Current Facility-Administered Medications  Medication Dose Route Frequency Provider Last Rate Last Dose  . 0.9 %  sodium chloride infusion   Intravenous Continuous Andrew M Wight, MD 75 mL/hr at 08/12/14 0134    . acetaminophen (TYLENOL) tablet 650 mg  650 mg Oral Q6H PRN Andrew M Wight, MD       Or  . acetaminophen (TYLENOL) suppository 650 mg  650 mg Rectal Q6H PRN Andrew M Wight, MD      . aspirin tablet 325 mg  325 mg Oral Daily Andrew M Wight, MD   325 mg at 08/11/14 2115  . atorvastatin (LIPITOR) tablet 40 mg  40 mg Oral Q breakfast Andrew M Wight, MD      . cefTRIAXone (ROCEPHIN) 2 g in dextrose 5 % 50 mL IVPB  2 g Intravenous Q24H Hannah Muthersbaugh, PA-C 100 mL/hr at 08/11/14 2047 2 g at 08/11/14 2047   And  . metroNIDAZOLE (FLAGYL) IVPB 500 mg  500 mg Intravenous Q8H Hannah Muthersbaugh, PA-C 100 mL/hr at 08/12/14 0830 500 mg at 08/12/14 0830  . heparin injection 5,000 Units  5,000 Units Subcutaneous 3 times per day Andrew M Wight, MD   5,000 Units at 08/11/14 2200  . Influenza vac split quadrivalent PF (FLUARIX) injection 0.5 mL  0.5 mL Intramuscular Tomorrow-1000 Todd D McDiarmid, MD      . insulin aspart (novoLOG) injection 0-9 Units  0-9 Units Subcutaneous TID WC Andrew M Wight, MD   2 Units at 08/12/14 0827  . metoprolol succinate (TOPROL-XL) 24 hr tablet 50 mg  50 mg Oral Daily Andrew M Wight, MD   50 mg at 08/11/14 2115  . ondansetron (ZOFRAN) tablet 4 mg  4 mg Oral Q6H PRN Andrew M Wight,  MD       Or  . ondansetron (ZOFRAN) injection 4 mg  4 mg Intravenous Q6H PRN Andrew M Wight, MD      . oxyCODONE (Oxy IR/ROXICODONE) immediate release tablet 5 mg  5 mg Oral Q4H PRN Andrew M Wight, MD      . pneumococcal 23 valent vaccine (PNU-IMMUNE) injection 0.5 mL  0.5 mL Intramuscular Tomorrow-1000 Todd D McDiarmid, MD      . sodium chloride 0.9 % injection 3 mL  3 mL Intravenous Q12H Andrew M Wight, MD      . vitamin B-12 (CYANOCOBALAMIN) tablet 50 mcg  50 mcg Oral Daily Andrew M Wight, MD   50 mcg at 08/11/14 2115    Pt meds include: Statin :Yes Betablocker: Yes ASA: Yes Other anticoagulants/antiplatelets: none  Past Medical History  Diagnosis Date  . Diabetes mellitus without complication   . Stroke     expressive aphasia  . Hypertension     Past Surgical History  Procedure Laterality Date  . No past surgeries    . I&d extremity Left 12/04/2012    Procedure: IRRIGATION AND DEBRIDEMENT Left Hand with Ring Removal  times three, Carpal Tunnel , Flexor tendon Synovectomy;    Surgeon: David A Thompson, MD;  Location: [facility] OR;  Service: Orthopedics;  Laterality: Left;  . Amputation Left 12/06/2012    Procedure: AMPUTATION DIGIT LEFT INDEX;  Surgeon: David A Thompson, MD;  Location: [facility] OR;  Service: Orthopedics;  Laterality: Left;  . I&d extremity Left 12/06/2012    Procedure: IRRIGATION AND DEBRIDEMENT EXTREMITY LEFT HAND;  Surgeon: David A Thompson, MD;  Location: [facility] OR;  Service: Orthopedics;  Laterality: Left;  . Hernia repair      bilat ing age 18months  . Tendon repair Left 12/23/2012    Procedure: DIVISION AND INSETTING FLAP TO LEFT RING FINGER ADJACENT TISSUE REARRANGEMENT LEFT INDEX FINGER AMPUTATION SITE;  Surgeon: David A Thompson, MD;  Location:  SURGERY CENTER;  Service: Orthopedics;  Laterality: Left;    Social History History  Substance Use Topics  . Smoking status: Never Smoker   . Smokeless tobacco: Never Used  . Alcohol Use: 4.8 oz/week    8 Cans of  beer per week     Comment: 8-12 beers qd     Family History History reviewed. No pertinent family history.  Allergies  Allergen Reactions  . Unasyn [Ampicillin-Sulbactam Sodium] Rash     REVIEW OF SYSTEMS  General: [ ] Weight loss, [ ] Fever, [ ] chills Neurologic: [ ] Dizziness, [ ] Blackouts, [ ] Seizure [ ] Stroke, [ ] "Mini stroke", [ ] Slurred speech, [ ] Temporary blindness; [ ] weakness in arms or legs, [ ] Hoarseness [ ] Dysphagia Cardiac: [ ] Chest pain/pressure, [ ] Shortness of breath at rest [ ] Shortness of breath with exertion, [ ] Atrial fibrillation or irregular heartbeat  Vascular: [x ] Pain in legs with walking, [ ] Pain in legs at rest, [ ] Pain in legs at night,  [ ] Non-healing ulcer, [ ] Blood clot in vein/DVT,   Pulmonary: [ ] Home oxygen, [ ] Productive cough, [ ] Coughing up blood, [ ] Asthma,  [ ] Wheezing [ ] COPD Musculoskeletal:  [ ] Arthritis, [ ] Low back pain, [ ] Joint pain Hematologic: [ ] Easy Bruising, [ ] Anemia; [ ] Hepatitis Gastrointestinal: [ ] Blood in stool, [ ] Gastroesophageal Reflux/heartburn, Urinary: [ ] chronic Kidney disease, [ ] on HD - [ ] MWF or [ ] TTHS, [ ] Burning with urination, [ ] Difficulty urinating Skin: [ ] Rashes, [x ] Wounds Psychological: [ ] Anxiety, [ ] Depression  Physical Examination Filed Vitals:   08/11/14 2048 08/11/14 2106 08/11/14 2112 08/12/14 0539  BP:   130/97 128/61  Pulse:   105 110  Temp: 98.4 F (36.9 C)  99.1 F (37.3 C) 98.8 F (37.1 C)  TempSrc: Oral  Oral Oral  Resp:   20 16  Height:  5' 11" (1.803 m)    Weight:  181 lb 4.8 oz (82.237 kg)    SpO2:   99% 97%   Body mass index is 25.3 kg/(m^2).  General:  WDWN in NAD HENT: WNL Eyes: Pupils equal Pulmonary: normal non-labored breathing , without Rales, rhonchi,  wheezing Cardiac: sinus tachycardia, without  Murmurs, rubs or gallops; No carotid bruits Abdomen: soft, NT, no masses Skin: no rashes, ulcers noted;  no Gangrene , no  cellulitis; no open wounds;   Vascular Exam/Pulses:Palpable femoral pulses bilateral, doppler DP/PT bilaterally   Right great toe dry gangrene, with forefoot cellulitis below the open wound at the base of the great toe.  Plantar first metatarsal head wound   with plantar cellulitis.     Musculoskeletal: no muscle wasting or atrophy; no edema.  Missing digits on the hands secondary to infection.  Neurologic: A&O X 3; Appropriate Affect ;  SENSATION: normal; MOTOR FUNCTION: 5/5 Symmetric Speech is fluent/normal   Significant Diagnostic Studies: CBC Lab Results  Component Value Date   WBC 6.5 08/12/2014   HGB 10.7* 08/12/2014   HCT 32.6* 08/12/2014   MCV 88.3 08/12/2014   PLT 256 08/12/2014    BMET    Component Value Date/Time   NA 136 08/12/2014 0637   K 4.2 08/12/2014 0637   CL 105 08/12/2014 0637   CO2 22 08/12/2014 0637   GLUCOSE 224* 08/12/2014 0637   BUN 8 08/12/2014 0637   CREATININE 0.67 08/12/2014 0637   CREATININE 0.81 04/02/2014 1432   CALCIUM 7.9* 08/12/2014 0637   GFRNONAA >90 08/12/2014 0637   GFRAA >90 08/12/2014 0637   Estimated Creatinine Clearance: 115 mL/min (by C-G formula based on Cr of 0.67).  COAG No results found for: INR, PROTIME   Non-Invasive Vascular Imaging: pending ABI's  ASSESSMENT/PLAN:  Dry gangrene right great toe with forefoot cellulitis DM peripheral neuropathy He does have doppler monophasic signals DP/PT on bilateral feet. Pending ABI's we may have to perform an angiogram to better delineate his arterial flow. Currently on heparin 5000 [place] TID   COLLINS, EMMA MAUREEN 08/12/2014 8:49 AM  Agree with above assessment Patient with gangrene right first toe and cellulitis dorsum of distal half of foot. 3+ femoral pulse palpable with 0-1+ popliteal at most palpable on right-no distal pulses Left leg with 3+ femoral 3+ popliteal pulse palpable no distal pulses.  Suspect comminution of superficial femoral-popliteal and tibial  occlusive disease which may or may not be amenable to revascularization   Plan angiography by Dr. Chen tomorrow with possible intervention right leg if feasible If percutaneous intervention not feasible we'll plan surgical bypass on Friday if he is candidate for revascularization Discussed situation with Dr. Duda who plans open debridement and/or distal amputation of right foot on Friday following are revascularization procedure or independently depending on the above findings  Discussed the situation at length with patient. He does have a aphasia but does seem to understand the plan and the fact that he is at high risk for limb loss 

## 2014-08-13 NOTE — Progress Notes (Signed)
ANTIBIOTIC CONSULT NOTE - FOLLOW UP  Pharmacy Consult for Re-vancomycin Indication: osteomyelitis  Allergies  Allergen Reactions  . Unasyn [Ampicillin-Sulbactam Sodium] Rash    Patient Measurements: Height:  (180.3 cm) Weight: 181 lb 4.8 oz (82.237 kg) IBW/kg (Calculated) : 75.3   Vital Signs: Temp: 98.6 F (37 C) (01/28 0536) Temp Source: Oral (01/28 0536) BP: 125/73 mmHg (01/28 1116) Pulse Rate: 88 (01/28 1341) Intake/Output from previous day: 01/27 0701 - 01/28 0700 In: 985 [P.O.:120; I.V.:565; IV Piggyback:300] Out: 1175 [Urine:1175] Intake/Output from this shift: Total I/O In: 0  Out: 725 [Urine:725]  Labs:  Recent Labs  08/11/14 1644 08/12/14 0637 08/13/14 0917  WBC 8.5 6.5 6.1  HGB 12.2* 10.7* 10.9*  PLT 297 256 265  CREATININE 0.96 0.67 0.58   Estimated Creatinine Clearance: 115 mL/min (by C-G formula based on Cr of 0.58).  Recent Labs  08/13/14 1314  VANCOTROUGH 12.6     Microbiology: Recent Results (from the past 720 hour(s))  Blood Culture (routine x 2)     Status: None (Preliminary result)   Collection Time: 08/11/14  4:50 PM  Result Value Ref Range Status   Specimen Description BLOOD LEFT ARM  Final   Special Requests BOTTLES DRAWN AEROBIC AND ANAEROBIC  Final   Culture   Final           BLOOD CULTURE RECEIVED NO GROWTH TO DATE CULTURE WILL BE HELD FOR 5 DAYS BEFORE ISSUING A FINAL NEGATIVE REPORT Performed at [facility]    Report Status PENDING  Incomplete  Urine culture     Status: None   Collection Time: 08/11/14  6:10 PM  Result Value Ref Range Status   Specimen Description URINE, CLEAN CATCH  Final   Special Requests NONE  Final   Colony Count NO GROWTH Performed at [facility]   Final   Culture NO GROWTH Performed at [facility]   Final   Report Status 08/13/2014 FINAL  Final  Blood Culture (routine x 2)     Status: None (Preliminary result)   Collection Time: 08/11/14  7:00 PM   Result Value Ref Range Status   Specimen Description BLOOD RIGHT ARM  Final   Special Requests BOTTLES DRAWN AEROBIC AND ANAEROBIC  Final   Culture   Final    GRAM POSITIVE COCCI IN CLUSTERS Note: Gram Stain Report Called to,Read Back By and Verified With: Gae Gallop RN 730P Performed at [facility]    Report Status PENDING  Incomplete    Anti-infectives    Start     Dose/Rate Route Frequency Ordered Stop   08/12/14 1300  vancomycin (VANCOCIN) IVPB 1000 mg/200 mL premix     1,000 mg200 mL/hr over 60 Minutes Intravenous Every 8 hours 08/12/14 1224     08/11/14 1715  cefTRIAXone (ROCEPHIN) 2 g in dextrose 5 % 50 mL IVPB     2 g100 mL/hr over 30 Minutes Intravenous Every 24 hours 08/11/14 1703     08/11/14 1715  metroNIDAZOLE (FLAGYL) IVPB 500 mg     500 mg100 mL/hr over 60 Minutes Intravenous Every 8 hours 08/11/14 1703        Assessment: Patient is a 54 y.o M on vancomycin day #1 for osteomyelitis.S/p VVS interventions today.   All cultures ave been negative thus far.  Vancomycin trough level now back slightly below goal at 12.6.  Goal of Therapy:  Vancomycin trough level 15-20 mcg/ml  Plan:  - increase vancomycin to  1250mg  IV q8h  Dorna Leitzham, Hadlee Burback P 08/13/2014,4:09 PM

## 2014-08-13 NOTE — Clinical Social Work Placement (Signed)
Clinical Social Work Department CLINICAL SOCIAL WORK PLACEMENT NOTE 08/13/2014  Patient:  Stephen Fitzgerald,[name]  Account Number:  [id number] Admit date:  08/11/2014  Clinical Social Worker:  Cherre BlancJOSEPH BRYANT Jermayne Sweeney, [place]  Date/time:  08/13/2014 04:25 PM  Clinical Social Work is seeking post-discharge placement for this patient at the following level of care:   SKILLED NURSING   (*CSW will update this form in Epic as items are completed)   08/13/2014  Patient/family provided with [facility] Department of Clinical Social Work's list of facilities offering this level of care within the geographic area requested by the patient (or if unable, by the patient's family).  08/13/2014  Patient/family informed of their freedom to choose among providers that offer the needed level of care, that participate in Medicare, Medicaid or managed care program needed by the patient, have an available bed and are willing to accept the patient.  08/13/2014  Patient/family informed of [facility]' ownership interest in [facility], as well as of the fact that they are under no obligation to receive care at this facility.  PASARR submitted to EDS on 08/13/2014 PASARR number received on 08/13/2014  FL2 transmitted to all facilities in geographic area requested by pt/family on  08/13/2014 FL2 transmitted to all facilities within larger geographic area on   Patient informed that his/her managed care company has contracts with or will negotiate with  certain facilities, including the following:     Patient/family informed of bed offers received:  08/13/2014 Patient chooses bed at  Physician recommends and patient chooses bed at    Patient to be transferred to  on   Patient to be transferred to facility by  Patient and family notified of transfer on  Name of family member notified:    The following physician request were entered in Epic:   Additional Comments:   Patient has transferred to 2W,  CSW has made unit CSW aware of situation by verbal handoff.   Roddie McBryant Donjuan Robison MSW, [place], [place], [phone]

## 2014-08-13 NOTE — Interval H&P Note (Signed)
[facility] [place]  History and Physical Update  The patient was interviewed and re-examined.  The patient's previous History and Physical has been reviewed and is unchanged from Dr. Candie ChromanLawson's consult.  There is no change in the plan of care: Aortogram, bilateral leg runoff, possible right leg intervention.  I discussed with the patient the nature of angiographic procedures, especially the limited patencies of any endovascular intervention.  The patient is aware of that the risks of an angiographic procedure include but are not limited to: bleeding, infection, access site complications, renal failure, embolization, rupture of vessel, dissection, possible need for emergent surgical intervention, possible need for surgical procedures to treat the patient's pathology, anaphylactic reaction to contrast, and stroke and death.  The patient is aware of the risks and agrees to proceed.  Laboratory: CBC:    Component Value Date/Time   WBC 6.1 08/13/2014 0917   RBC 3.72* 08/13/2014 0917   HGB 10.9* 08/13/2014 0917   HCT 33.2* 08/13/2014 0917   PLT 265 08/13/2014 0917   MCV 89.2 08/13/2014 0917   MCH 29.3 08/13/2014 0917   MCHC 32.8 08/13/2014 0917   RDW 12.5 08/13/2014 0917   LYMPHSABS 1.3 08/11/2014 1644   MONOABS 0.6 08/11/2014 1644   EOSABS 0.1 08/11/2014 1644   BASOSABS 0.0 08/11/2014 1644    BMP:    Component Value Date/Time   NA 138 08/13/2014 0917   K 4.3 08/13/2014 0917   CL 105 08/13/2014 0917   CO2 24 08/13/2014 0917   GLUCOSE 253* 08/13/2014 0917   BUN <5* 08/13/2014 0917   CREATININE 0.58 08/13/2014 0917   CREATININE 0.81 04/02/2014 1432   CALCIUM 8.0* 08/13/2014 0917   GFRNONAA >90 08/13/2014 0917   GFRAA >90 08/13/2014 0917    Coagulation: Lab Results  Component Value Date   INR 1.25 08/13/2014   No results found for: PTT    Leonides SakeBrian Kiandria Clum, MD [facility] [place] Office: [phone] Pager: [phone]  08/13/2014,  1:39 PM

## 2014-08-13 NOTE — Clinical Social Work Psychosocial (Signed)
Clinical Social Work Department BRIEF PSYCHOSOCIAL ASSESSMENT 08/13/2014  Patient:  Stephen Fitzgerald,Stephen Fitzgerald     Account Number:  [id number]     Admit date:  08/11/2014  Clinical Social Worker:  Stacy GardnerAVENPORT,Trysten Bernard, CLINICAL SOCIAL WORKER  Date/Time:  08/13/2014 10:30 AM  Referred by:  Physician  Date Referred:  08/13/2014 Referred for  SNF Placement   Other Referral:   N/A   Interview type:  Patient Other interview type:   BSW intern spoke with patients father via phone call.    PSYCHOSOCIAL DATA Living Status:  ALONE Admitted from facility:   Level of care:   Primary support name:  Bill Primary support relationship to patient:  PARENT Degree of support available:   Support is good.    CURRENT CONCERNS Current Concerns  Post-Acute Placement   Other Concerns:   N/A    SOCIAL WORK ASSESSMENT / PLAN BSW intern spoke with patient at bedside and patient was unsure if he would be going to SNF when discharging from the hospital. Patient gave permission to speak with his father about plans after discharge. Patients father wanted him to go to a SNF after discharge and [facility] because [facility] there before. [place] and would prefer for [place] stay [place] so [place] are able to see him. BSW intern explained SNF search/placement process to patient and family and answered questions.   Assessment/plan status:  Psychosocial Support/Ongoing Assessment of Needs Other assessment/ plan:   Complete Fl2, Fax, PASRR   Information/referral to community resources:   Air traffic controllerBSW intern gave contact info to patients father.    PATIENT'S/FAMILY'S RESPONSE TO PLAN OF CARE: Patients family would like patient to go to SNF when medically stable to discharge. BSW intern signing off now.       Stacy GardnerErin Mccrae Speciale, BSW Intern, [phone]

## 2014-08-13 NOTE — Progress Notes (Signed)
Family Medicine Teaching Service Daily Progress Note Intern Pager: [phone]  Patient name: Stephen Fitzgerald Medical record number: [id number] Date of birth: Dec 30, 1960 Age: 54 y.o. Gender: male  Primary Care Provider: Levert Feinstein, MD Consultants: Ortho Code Status: full  Pt Overview and Major Events to Date:  1/26 - admit for osteomyelitis  Assessment and Plan:  Stephen Fitzgerald is a 54 y.o. male presenting with osteomyelitis and sepsis. 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 Seen by ortho hand today in office and sent to ED. Sepsis criteria on admission by tachycardia, fever, foot/hand as source. Pt vitals have improved, now afebrile and tachycardia improved; does not appear clinically ill on exam. ED workup significant for normal lactic acid 1.82 and 1.09, WBC 8.5, electrolytes wnl. Plain films demonstrate bony destruction 4th DIP consistent with osteomyelitis, fracture base of right great toe and extensive soft tissue infection with gas, bony destruction medial margin head of first metatarsal. Pt started on ceftriaxone and flagyl in ED for severe diabetic foot without risk factors for MRSA or pseudomonas. Coverage also not ideal for c. Perfringens but does not appear to be gas gangrene and more likely mixed aerobic/anerobic organisms. - BCx - 1 of 2 with GPCs in clusters - continue ceftriaxone, flagyl, Vanc - consults to orthopedics, appreciate recs and assistance in management - ABI/Toe brachial index ordered - wound care consult - will defer to ortho/vasc - VVS consulting, appreciate recs - angiography today - apap and oxyIR for pain control  # T2DM: poorly controlled. A1c 11.6 on admission - CBG qac qhs- 240s-320s O/N - SSI, sensitive - will increase to moderate today - Start Lantus 5 units daily today  #  Systolic Heart murmur:  First recognized in 07/2013.  - Echo ordered today, especially given bacteremia  # HTN: Currently normotensive. - continue toprol xl   # HLD: Continue atorva  # Probable sebaceous cyst right upper back: - pt declines any intervention for this  FEN/GI: carb mod diet / NS 75 cc/hr Prophylaxis: heparin sq  Disposition: Pending clinical improvement, ortho/VVS recs  Subjective:  Is awaiting angiogram. Agrees with plan to continue antibiotics.  Objective: Temp:  [98.6 F (37 C)-99 F (37.2 C)] 98.6 F (37 C) (01/28 0536) Pulse Rate:  [104-108] 105 (01/28 0536) Resp:  [18] 18 (01/28 0536) BP: (116-139)/(69-81) 139/81 mmHg (01/28 0536) SpO2:  [98 %-99 %] 98 % (01/28 0536) Physical Exam: General: NAD HEENT: PERRL, EOMI, MMM Cardiovascular: tachycardic, regular rhythm, normal s1s2, systolic murmur present. 2+ radial pulses. Left PT and DP pulses 2+, unable to palpate right PT and DP. Respiratory: clear bilaterally, normal effort Abdomen: soft, nontender, nondistended, normal bowel sounds Extremities: pitting edema right foot and ankle. Right foot: great toe black and necrotic down into area of distal metatarsal, erythematous and swollen to mid foot, tender up to lower part of ankle. Left 4th digit swollen, tender, necrotic DIP (SEE PICTURES in H+P) Neuro: alert, aphasia at baseline/word salad, understands language and questions well. No focal deficits otherwise noted  Laboratory:  Recent Labs Lab 08/11/14 1644 08/12/14 0637  WBC 8.5 6.5  HGB 12.2* 10.7*  HCT 36.6* 32.6*  PLT 297 256    Recent Labs Lab 08/11/14 1644 08/12/14 0637  NA 135 136  K 5.1 4.2  CL 99 105  CO2 23 22  BUN 14  8  CREATININE 0.96 0.67  CALCIUM 8.7 7.9*  PROT 7.5  --   BILITOT 0.8  --   ALKPHOS 126*  --   ALT 13  --   AST 15  --   GLUCOSE 373* 224*    Lactic acid 1.09  Hgb A1c 11.6  Imaging/Diagnostic Tests: Dg Chest 1 View (1/26):  No active disease.  Dg  Hand Complete Left (1/26):  Bony destruction about the fourth DIP, suspicious for osteomyelitis  Dg Foot Complete Right (1/26):  Minimally displaced fracture at base of proximal phalanx RIGHT great toe extending intra-articular at first MTP joint. Extensive soft tissue infection by gas-forming organism extending from the level of the distal first metatarsal to the base of the distal phalanx great toe. Osteomyelitis with bone destruction at the medial margin of the head of the first metatarsal.   Shirlee LatchAngela Denyla Cortese, MD 08/13/2014, 8:55 AM PGY-1, [facility] FPTS Intern pager: [phone], text pages welcome

## 2014-08-13 NOTE — Progress Notes (Signed)
VASCULAR LAB PRELIMINARY  ARTERIAL  ABI completed:    RIGHT    LEFT    PRESSURE WAVEFORM  PRESSURE WAVEFORM  BRACHIAL 130 Triphasic BRACHIAL 131 Triphasic  DP 220 Monophasic DP Noncompressible Triphasic  AT   AT    PT 151 Monophasic PT Noncompressible Triphasic  PER   PER    GREAT TOE  NA GREAT TOE  NA    RIGHT LEFT  ABI 1.68 Noncompressible    The right ABI is elevated, unable to calculate left ABI due to noncompressible vessels; these findings suggest calcified vessels.  08/13/2014 12:56 PM Gertie FeyMichelle Cailen Mihalik, RVT, RDCS, RDMS

## 2014-08-13 NOTE — Progress Notes (Signed)
PATIENT ID: Lamar SprinklesJeffrey W Hoheisel  MRN: [id number]  DOB/AGE:  53/02/1961 / 53 y.o.   Patient has necrotic left ring fingertip.  Will require amputation of L RF through the middle phalanx, possibly at PIP level. I will attempt to coordinate this with surgery for R LE, possibly for tomorrow. D/w patient, consent obtained, site marked.  Cliffton Astersavid A. Janee Mornhompson, MD      Orthopaedic & Hand Surgery [facility] [address] [place], [place]  [id number] Office: [phone] Mobile: [phone]  08/13/2014, 5:33 PM

## 2014-08-13 NOTE — Progress Notes (Signed)
OT Cancellation Note  Patient Details Name: Stephen Fitzgerald MRN: [id number] DOB: 09/24/1960   Cancelled Treatment:    Reason Eval/Treat Not Completed: Patient at procedure or test/ unavailable. Pt off floor at cath lab; OT will follow up with pt as available to complete evaluation.   Nena JordanMiller, Bogdan Vivona M 08/13/2014, 2:03 PM   Carney LivingLeeAnn Marie Shawnay Bramel, OTR/L Occupational Therapist [phone] (pager)

## 2014-08-13 NOTE — Progress Notes (Signed)
Inpatient Diabetes Program Recommendations  AACE/ADA: New Consensus Statement on Inpatient Glycemic Control (2013)  Target Ranges:  Prepandial:   less than 140 mg/dL      Peak postprandial:   less than 180 mg/dL (1-2 hours)      Critically ill patients:  140 - 180 mg/dL   Reason for Assessment:  Results for Stephen Fitzgerald, Stephen Fitzgerald (MRN [id number]) as of 08/13/2014 11:20  Ref. Range 08/12/2014 08:07 08/12/2014 12:06 08/12/2014 17:36 08/12/2014 21:27 08/13/2014 08:16  Glucose-Capillary Latest Range: 70-99 mg/dL 782192 (H) 956288 (H) 213244 (H) 316 (H) 324 (H)   Please consider adding Lantus 12 units daily.  Thanks, Beryl MeagerJenny Shaine Mount, RN, BC-ADM Inpatient Diabetes Coordinator Pager [phone]

## 2014-08-14 ENCOUNTER — Encounter (HOSPITAL_COMMUNITY)
Admission: EM | Disposition: A | Discharge status: Skilled Nursing Facility | Payer: Self-pay | Source: Home / Self Care | Attending: Family Medicine

## 2014-08-14 LAB — BASIC METABOLIC PANEL
ANION GAP: 7 (ref 5–15)
BUN: 6 mg/dL (ref 6–23)
CALCIUM: 8.2 mg/dL — AB (ref 8.4–10.5)
CO2: 24 mmol/L (ref 19–32)
CREATININE: 0.65 mg/dL (ref 0.50–1.35)
Chloride: 109 mmol/L (ref 96–112)
GFR calc non Af Amer: >90 mL/min (ref >90)
Glucose, Bld: 239 mg/dL — ABNORMAL HIGH (ref 70–99)
Potassium: 4.3 mmol/L (ref 3.5–5.1)
SODIUM: 140 mmol/L (ref 135–145)

## 2014-08-14 LAB — GLUCOSE, CAPILLARY
GLUCOSE-CAPILLARY: 235 mg/dL — AB (ref 70–99)
GLUCOSE-CAPILLARY: 301 mg/dL — AB (ref 70–99)
Glucose-Capillary: 259 mg/dL — ABNORMAL HIGH (ref 70–99)
Glucose-Capillary: 222 mg/dL — ABNORMAL HIGH (ref 70–99)
Glucose-Capillary: 263 mg/dL — ABNORMAL HIGH (ref 70–99)

## 2014-08-14 LAB — CBC
HEMATOCRIT: 33.4 % — AB (ref 39.0–52.0)
Hemoglobin: 10.9 g/dL — ABNORMAL LOW (ref 13.0–17.0)
MCH: 28.9 pg (ref 26.0–34.0)
MCHC: 32.6 g/dL (ref 30.0–36.0)
MCV: 88.6 fL (ref 78.0–100.0)
Platelets: 291 10*3/uL (ref 150–400)
RBC: 3.77 MIL/uL — AB (ref 4.22–5.81)
RDW: 12.7 % (ref 11.5–15.5)
WBC: 5.6 10*3/uL (ref 4.0–10.5)

## 2014-08-14 SURGERY — AMPUTATION DIGIT
Anesthesia: Monitor Anesthesia Care | Laterality: Left

## 2014-08-14 SURGERY — BYPASS GRAFT FEMORAL-POPLITEAL ARTERY
Anesthesia: General | Laterality: Right

## 2014-08-14 MED ORDER — INSULIN GLARGINE 100 UNIT/ML ~~LOC~~ SOLN
8.0000 [IU] | Freq: Every day | SUBCUTANEOUS | Status: DC
  Administered 2014-08-14 – 2014-08-15 (×2): 8 [IU] via SUBCUTANEOUS
  Filled 2014-08-14 (×3): qty 0.08

## 2014-08-14 MED ORDER — POLYETHYLENE GLYCOL 3350 17 G PO PACK
17.0000 g | PACK | Freq: Every day | ORAL | Status: DC
  Administered 2014-08-14: 17 g via ORAL
  Filled 2014-08-14 (×2): qty 1

## 2014-08-14 NOTE — Progress Notes (Signed)
PT Cancellation Note  Patient Details Name: Stephen SprinklesJeffrey W Carnathan MRN: [id number] DOB: Jan 14, 1961   Cancelled Treatment:    Reason Eval/Treat Not Completed: Medical issues which prohibited therapy (pt currently on bedrest with plan for OR and amputation tomorrow. Will wait until clearance post-op for eval)   Toney Sangabor, Kialee Kham [facility] 08/14/2014, 9:51 AM Delaney MeigsMaija Tabor Clemencia Helzer, PT [phone]

## 2014-08-14 NOTE — Progress Notes (Signed)
OT Cancellation Note  Patient Details Name: Stephen Fitzgerald MRN: [id number] DOB: 07-18-1960   Cancelled Treatment:    Reason Eval/Treat Not Completed: Medical issues which prohibited therapy. Pt currently on bedrest with plan for OR and amputation tomorrow. Will continue to follow.   Evern BioMayberry, Yadhira Mckneely Lynn 08/14/2014, 11:02 AM  [phone]

## 2014-08-14 NOTE — Progress Notes (Signed)
Inpatient Diabetes Program Recommendations  AACE/ADA: New Consensus Statement on Inpatient Glycemic Control (2013)  Target Ranges: Prepandial: less than 140 mg/dL  Peak postprandial:  less than 180 mg/dL (1-2 hours)  Critically ill patients: 140 - 180 mg/dL   Recommendations: Please consider increasing Lantus to 12 units daily and increasing correction to resistant scale.   Thank you.  Emerlyn Mehlhoff S. Elsie Lincolnouth, RN, CNS, CDE Inpatient Diabetes Program, team pager [phone]

## 2014-08-14 NOTE — Progress Notes (Signed)
Family Medicine Teaching Service Daily Progress Note Intern Pager: [phone]  Patient name: Stephen Fitzgerald Medical record number: [id number] Date of birth: 07/13/1961 Age: 54 y.o. Gender: male  Primary Care Provider: Levert FeinsteinMcIntyre, Brittany, MD Consultants: Ortho, VVS Code Status: full  Pt Overview and Major Events to Date:  1/26 - admit for osteomyelitis  Assessment and Plan:  Stephen Fitzgerald is a 54 y.o. male presenting with osteomyelitis and sepsis. 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  Sepsis criteria on admission by tachycardia, fever, foot/hand as source. Pt vitals have improved, now afebrile and tachycardia improved; does not appear clinically ill on exam. ED workup significant for normal lactic acid 1.82 and 1.09, WBC 8.5, electrolytes wnl. Plain films demonstrate bony destruction 4th DIP consistent with osteomyelitis, fracture base of right great toe and extensive soft tissue infection with gas, bony destruction medial margin head of first metatarsal.  - BCx - 1 of 2 with GPCs in clusters - continue ceftriaxone, flagyl, Vanc - consults to orthopedics, appreciate recs and assistance in management - plan for OR tomorrow AM - ABIs- R ant tib 1.68, R PT 1.15 - wound care consult - will defer to ortho/vasc - VVS consulting, appreciate recs - intact blood flow to R foot, not etiology of wet gangrene - apap and oxyIR for pain control  # T2DM: poorly controlled. A1c 11.6 on admission - CBG qac qhs- 135-230s O/N - SSI, moderate - Increase Lantus to 8 units daily  # Systolic Heart murmur:  First recognized in 07/2013.  - Echo pending  # HTN: Currently normotensive. - continue toprol xl 50mg   # HLD: Continue atorva  # Probable sebaceous cyst right upper back: - pt declines any intervention for this  FEN/GI: carb  mod diet / NS 75 cc/hr Prophylaxis: heparin sq  Disposition: Pending clinical improvement, ortho/VVS recs  Subjective:  Understands that he is going to OR tomorrow for amputation.  Feels like he needs to have BM but not constipated.  Objective: Temp:  [98.3 F (36.8 C)-99 F (37.2 C)] 98.3 F (36.8 C) (01/29 0432) Pulse Rate:  [74-107] 91 (01/29 0432) Resp:  [18] 18 (01/29 0432) BP: (118-133)/(65-75) 133/75 mmHg (01/29 0432) SpO2:  [94 %-98 %] 97 % (01/29 0432) Physical Exam: General: NAD HEENT: PERRL, EOMI, MMM Cardiovascular: tachycardic, regular rhythm, normal s1s2, systolic murmur present. 2+ radial pulses. Left PT and DP pulses 2+, unable to palpate right PT and DP. Respiratory: clear bilaterally, normal effort Abdomen: soft, nontender, nondistended, normal bowel sounds Extremities: pitting edema right foot and ankle. Right foot: great toe black and necrotic down into area of distal metatarsal, erythematous and swollen to mid foot, tender up to lower part of ankle. Left 4th digit swollen, tender, necrotic DIP (SEE PICTURES in H+P) Neuro: alert, aphasia at baseline/word salad, understands language and questions well. No focal deficits otherwise noted  Laboratory:  Recent Labs Lab 08/12/14 0637 08/13/14 0917 08/14/14 0500  WBC 6.5 6.1 5.6  HGB 10.7* 10.9* 10.9*  HCT 32.6* 33.2* 33.4*  PLT 256 265 291    Recent Labs Lab 08/11/14 1644 08/12/14 0637 08/13/14 0917 08/14/14 0500  NA 135 136 138 140  K 5.1 4.2 4.3 4.3  CL 99 105 105 109  CO2 23 22 24 24   BUN 14 8 <5* 6  CREATININE 0.96 0.67 0.58 0.65  CALCIUM 8.7 7.9* 8.0* 8.2*  PROT 7.5  --   --   --   BILITOT 0.8  --   --   --   ALKPHOS 126*  --   --   --   ALT 13  --   --   --   AST 15  --   --   --   GLUCOSE 373* 224* 253* 239*    Lactic acid 1.09  Hgb A1c 11.6  Imaging/Diagnostic Tests: Dg Chest 1 View (1/26):  No active disease.  Dg Hand Complete Left (1/26):  Bony destruction about the fourth  DIP, suspicious for osteomyelitis  Dg Foot Complete Right (1/26):  Minimally displaced fracture at base of proximal phalanx RIGHT great toe extending intra-articular at first MTP joint. Extensive soft tissue infection by gas-forming organism extending from the level of the distal first metatarsal to the base of the distal phalanx great toe. Osteomyelitis with bone destruction at the medial margin of the head of the first metatarsal.   Shirlee Latch, MD 08/14/2014, 7:31 AM PGY-1, [facility] Family Medicine FPTS Intern pager: [phone], text pages welcome

## 2014-08-14 NOTE — Care Management Note (Signed)
    Page 1 of 1   08/17/2014     4:32:59 PM CARE MANAGEMENT NOTE 08/17/2014  Patient:  Stephen SprinklesBURROUGHS,Alon W   Account Number:  [id number]  Date Initiated:  08/14/2014  Documentation initiated by:  Letha CapeAYLOR,DEBORAH  Subjective/Objective Assessment:   dx gangreen right toe, necrotic left ring finger  admit- lives alone.     Action/Plan:   Anticipated DC Date:  08/17/2014   Anticipated DC Plan:  SKILLED NURSING FACILITY  In-house referral  Clinical Social Worker      DC Planning Services  CM consult      Choice offered to / List presented to:             Status of service:  Completed, signed off Medicare Important Message given?  YES (If response is "NO", the following Medicare IM given date fields will be blank) Date Medicare IM given:  08/13/2014 Medicare IM given by:  Letha CapeAYLOR,DEBORAH Date Additional Medicare IM given:  08/17/2014 Additional Medicare IM given by:  Donn PieriniKRISTI Anaira Seay  Discharge Disposition:  SKILLED NURSING FACILITY  Per UR Regulation:  Reviewed for med. necessity/level of care/duration of stay  If discussed at Long Length of Stay Meetings, dates discussed:    Comments:  08/17/14- 1530- Donn PieriniKristi Shivon Hackel RN, BSN [phone] Pt for d/c [facility] SNF today- [facility]- CSW following for d/c placement needs.  08/14/14 0759 Letha Capeeborah Taylor RN, BSN [phone] patient lives alone, patient with necrotic left ring finger and gangrene of right foot, for OR per ortho on saturday. Patient s/p aortogram on 1/28.  NCM will cont to follow for dc needs.

## 2014-08-14 NOTE — Progress Notes (Signed)
Patient ID: Stephen SprinklesJeffrey W Fitzgerald, male   DOB: November 13, 1960, 54 y.o.   MRN: [id number] Mr. Stephen PersonBurroughs understands that we will be proceeding to the OR tomorrow afternoon for a left ring finger amputation and a right foot first ray vs transmetatarsal amputation.

## 2014-08-15 ENCOUNTER — Encounter (HOSPITAL_COMMUNITY): Payer: Self-pay | Admitting: Certified Registered"

## 2014-08-15 ENCOUNTER — Inpatient Hospital Stay (HOSPITAL_COMMUNITY): Payer: Medicare Other | Admitting: Anesthesiology

## 2014-08-15 ENCOUNTER — Encounter (HOSPITAL_COMMUNITY)
Admission: EM | Disposition: A | Discharge status: Skilled Nursing Facility | Payer: Self-pay | Source: Home / Self Care | Attending: Family Medicine

## 2014-08-15 DIAGNOSIS — R011 Cardiac murmur, unspecified: Secondary | ICD-10-CM

## 2014-08-15 HISTORY — PX: AMPUTATION: SHX166

## 2014-08-15 LAB — GLUCOSE, CAPILLARY
GLUCOSE-CAPILLARY: 271 mg/dL — AB (ref 70–99)
Glucose-Capillary: 205 mg/dL — ABNORMAL HIGH (ref 70–99)
Glucose-Capillary: 196 mg/dL — ABNORMAL HIGH (ref 70–99)
Glucose-Capillary: 240 mg/dL — ABNORMAL HIGH (ref 70–99)

## 2014-08-15 SURGERY — AMPUTATION DIGIT
Anesthesia: General | Site: Foot | Laterality: Right

## 2014-08-15 MED ORDER — ARTIFICIAL TEARS OP OINT
TOPICAL_OINTMENT | OPHTHALMIC | Status: DC | PRN
  Administered 2014-08-15: 1 via OPHTHALMIC

## 2014-08-15 MED ORDER — BUPIVACAINE HCL (PF) 0.25 % IJ SOLN
INTRAMUSCULAR | Status: DC | PRN
  Administered 2014-08-15: 1 mL

## 2014-08-15 MED ORDER — SODIUM CHLORIDE 0.9 % IV SOLN
1250.0000 mg | Freq: Three times a day (TID) | INTRAVENOUS | Status: DC
  Administered 2014-08-15 – 2014-08-16 (×3): 1250 mg via INTRAVENOUS
  Filled 2014-08-15 (×5): qty 1250

## 2014-08-15 MED ORDER — ONDANSETRON HCL 4 MG/2ML IJ SOLN
INTRAMUSCULAR | Status: DC | PRN
  Administered 2014-08-15: 4 mg via INTRAVENOUS

## 2014-08-15 MED ORDER — PHENYLEPHRINE HCL 10 MG/ML IJ SOLN
INTRAMUSCULAR | Status: DC | PRN
  Administered 2014-08-15 (×3): 80 ug via INTRAVENOUS

## 2014-08-15 MED ORDER — BUPIVACAINE-EPINEPHRINE (PF) 0.5% -1:200000 IJ SOLN
INTRAMUSCULAR | Status: DC | PRN
  Administered 2014-08-15: 20 mL via PERINEURAL

## 2014-08-15 MED ORDER — PROPOFOL 10 MG/ML IV BOLUS
INTRAVENOUS | Status: DC | PRN
  Administered 2014-08-15: 200 mg via INTRAVENOUS

## 2014-08-15 MED ORDER — SODIUM CHLORIDE 0.9 % IR SOLN
Status: DC | PRN
  Administered 2014-08-15: 3000 mL

## 2014-08-15 MED ORDER — PHENYLEPHRINE 40 MCG/ML (10ML) SYRINGE FOR IV PUSH (FOR BLOOD PRESSURE SUPPORT)
PREFILLED_SYRINGE | INTRAVENOUS | Status: AC
  Filled 2014-08-15: qty 10

## 2014-08-15 MED ORDER — METRONIDAZOLE IN NACL 5-0.79 MG/ML-% IV SOLN
500.0000 mg | Freq: Three times a day (TID) | INTRAVENOUS | Status: DC
  Administered 2014-08-15 – 2014-08-16 (×3): 500 mg via INTRAVENOUS
  Filled 2014-08-15 (×5): qty 100

## 2014-08-15 MED ORDER — FENTANYL CITRATE 0.05 MG/ML IJ SOLN
INTRAMUSCULAR | Status: AC
  Filled 2014-08-15: qty 5

## 2014-08-15 MED ORDER — PROMETHAZINE HCL 25 MG/ML IJ SOLN: 6.2500 mg | INTRAMUSCULAR | Status: DC | PRN

## 2014-08-15 MED ORDER — FENTANYL CITRATE 0.05 MG/ML IJ SOLN
INTRAMUSCULAR | Status: DC | PRN
  Administered 2014-08-15 (×2): 50 ug via INTRAVENOUS

## 2014-08-15 MED ORDER — BUPIVACAINE HCL (PF) 0.25 % IJ SOLN
INTRAMUSCULAR | Status: AC
  Filled 2014-08-15: qty 30

## 2014-08-15 MED ORDER — ARTIFICIAL TEARS OP OINT
TOPICAL_OINTMENT | OPHTHALMIC | Status: AC
  Filled 2014-08-15: qty 3.5

## 2014-08-15 MED ORDER — ONDANSETRON HCL 4 MG/2ML IJ SOLN
INTRAMUSCULAR | Status: AC
  Filled 2014-08-15: qty 2

## 2014-08-15 MED ORDER — PROPOFOL 10 MG/ML IV BOLUS
INTRAVENOUS | Status: AC
  Filled 2014-08-15: qty 20

## 2014-08-15 MED ORDER — HYDROMORPHONE HCL 1 MG/ML IJ SOLN: 0.2500 mg | INTRAMUSCULAR | Status: DC | PRN

## 2014-08-15 MED ORDER — 0.9 % SODIUM CHLORIDE (POUR BTL) OPTIME
TOPICAL | Status: DC | PRN
  Administered 2014-08-15: 1000 mL

## 2014-08-15 SURGICAL SUPPLY — 64 items
BANDAGE ESMARK 6X9 LF (GAUZE/BANDAGES/DRESSINGS) ×2 IMPLANT
BANDAGE GAUZE 4  KLING STR (GAUZE/BANDAGES/DRESSINGS) IMPLANT
BLADE AVERAGE 25MMX9MM (BLADE) ×1
BLADE AVERAGE 25X9 (BLADE) ×3 IMPLANT
BLADE MINI RND TIP GREEN BEAV (BLADE) IMPLANT
BLADE SURG 10 STRL SS (BLADE) ×4 IMPLANT
BNDG COHESIVE 1X5 TAN STRL LF (GAUZE/BANDAGES/DRESSINGS) ×4 IMPLANT
BNDG COHESIVE 4X5 TAN STRL (GAUZE/BANDAGES/DRESSINGS) ×4 IMPLANT
BNDG COHESIVE 6X5 TAN STRL LF (GAUZE/BANDAGES/DRESSINGS) ×4 IMPLANT
BNDG CONFORM 2 STRL LF (GAUZE/BANDAGES/DRESSINGS) ×8 IMPLANT
BNDG ESMARK 4X9 LF (GAUZE/BANDAGES/DRESSINGS) ×4 IMPLANT
BNDG ESMARK 6X9 LF (GAUZE/BANDAGES/DRESSINGS) ×4
BNDG GAUZE ELAST 4 BULKY (GAUZE/BANDAGES/DRESSINGS) ×4 IMPLANT
BNDG GAUZE STRTCH 6 (GAUZE/BANDAGES/DRESSINGS) ×4 IMPLANT
CORDS BIPOLAR (ELECTRODE) ×4 IMPLANT
COVER SURGICAL LIGHT HANDLE (MISCELLANEOUS) ×4 IMPLANT
CUFF TOURNIQUET SINGLE 18IN (TOURNIQUET CUFF) IMPLANT
CUFF TOURNIQUET SINGLE 24IN (TOURNIQUET CUFF) IMPLANT
CUFF TOURNIQUET SINGLE 34IN LL (TOURNIQUET CUFF) IMPLANT
CUFF TOURNIQUET SINGLE 44IN (TOURNIQUET CUFF) IMPLANT
DRAPE U-SHAPE 47X51 STRL (DRAPES) ×8 IMPLANT
DURAPREP 26ML APPLICATOR (WOUND CARE) ×8 IMPLANT
ELECT REM PT RETURN 9FT ADLT (ELECTROSURGICAL) ×4
ELECTRODE REM PT RTRN 9FT ADLT (ELECTROSURGICAL) ×2 IMPLANT
GAUZE SPONGE 2X2 8PLY STRL LF (GAUZE/BANDAGES/DRESSINGS) IMPLANT
GAUZE SPONGE 4X4 12PLY STRL (GAUZE/BANDAGES/DRESSINGS) ×8 IMPLANT
GAUZE XEROFORM 1X8 LF (GAUZE/BANDAGES/DRESSINGS) ×4 IMPLANT
GAUZE XEROFORM 5X9 LF (GAUZE/BANDAGES/DRESSINGS) ×4 IMPLANT
GLOVE BIO SURGEON STRL SZ8 (GLOVE) ×4 IMPLANT
GLOVE BIOGEL PI IND STRL 8 (GLOVE) ×2 IMPLANT
GLOVE BIOGEL PI INDICATOR 8 (GLOVE) ×2
GLOVE ORTHO TXT STRL SZ7.5 (GLOVE) ×8 IMPLANT
GOWN STRL REUS W/ TWL LRG LVL3 (GOWN DISPOSABLE) ×2 IMPLANT
GOWN STRL REUS W/ TWL XL LVL3 (GOWN DISPOSABLE) ×8 IMPLANT
GOWN STRL REUS W/TWL LRG LVL3 (GOWN DISPOSABLE) ×2
GOWN STRL REUS W/TWL XL LVL3 (GOWN DISPOSABLE) ×8
HANDPIECE INTERPULSE COAX TIP (DISPOSABLE) ×2
KIT BASIN OR (CUSTOM PROCEDURE TRAY) ×4 IMPLANT
KIT ROOM TURNOVER OR (KITS) ×4 IMPLANT
MANIFOLD NEPTUNE II (INSTRUMENTS) ×4 IMPLANT
NEEDLE HYPO 25GX1X1/2 BEV (NEEDLE) ×4 IMPLANT
NS IRRIG 1000ML POUR BTL (IV SOLUTION) ×4 IMPLANT
PACK ORTHO EXTREMITY (CUSTOM PROCEDURE TRAY) ×4 IMPLANT
PAD ARMBOARD 7.5X6 YLW CONV (MISCELLANEOUS) ×8 IMPLANT
PAD CAST 4YDX4 CTTN HI CHSV (CAST SUPPLIES) IMPLANT
PADDING CAST COTTON 4X4 STRL (CAST SUPPLIES)
SET HNDPC FAN SPRY TIP SCT (DISPOSABLE) ×2 IMPLANT
SPECIMEN JAR SMALL (MISCELLANEOUS) IMPLANT
SPONGE GAUZE 2X2 STER 10/PKG (GAUZE/BANDAGES/DRESSINGS)
SPONGE LAP 18X18 X RAY DECT (DISPOSABLE) ×4 IMPLANT
STAPLER VISISTAT 35W (STAPLE) IMPLANT
STOCKINETTE IMPERVIOUS LG (DRAPES) ×4 IMPLANT
SUCTION FRAZIER TIP 10 FR DISP (SUCTIONS) ×4 IMPLANT
SUT ETHILON 2 0 FS 18 (SUTURE) ×4 IMPLANT
SUT ETHILON 2 0 PSLX (SUTURE) ×8 IMPLANT
SUT ETHILON 3 0 PS 1 (SUTURE) ×4 IMPLANT
SUT VIC AB 2-0 FS1 27 (SUTURE) IMPLANT
SYR CONTROL 10ML LL (SYRINGE) ×4 IMPLANT
TOWEL OR 17X24 6PK STRL BLUE (TOWEL DISPOSABLE) ×4 IMPLANT
TOWEL OR 17X26 10 PK STRL BLUE (TOWEL DISPOSABLE) ×4 IMPLANT
TUBE CONNECTING 12'X1/4 (SUCTIONS) ×1
TUBE CONNECTING 12X1/4 (SUCTIONS) ×3 IMPLANT
WATER STERILE IRR 1000ML POUR (IV SOLUTION) ×4 IMPLANT
YANKAUER SUCT BULB TIP NO VENT (SUCTIONS) ×4 IMPLANT

## 2014-08-15 NOTE — Progress Notes (Signed)
Orthopedic Tech Progress Note Patient Details:  Stephen SprinklesJeffrey W Fitzgerald Feb 23, 1961 [id number] Delivered Darco shoe to pt.'s nurse. Ortho Devices Type of Ortho Device: Darco shoe Ortho Device/Splint Interventions: Other (comment)   Lesle ChrisGilliland, Kynsleigh Westendorf L 08/15/2014, 5:39 PM

## 2014-08-15 NOTE — Anesthesia Postprocedure Evaluation (Signed)
Anesthesia Post Note  Patient: Stephen Fitzgerald  Procedure(s) Performed: Procedure(s) (LRB): AMPUTATION LEFT RING FINGER (Left) AMPUTATION RIGHT FOOT FIRST RAY AMPUTATION AND IRRIGATION AND DEBREDMENT OF RIGHT FOOT ABSCESS (Right)  Anesthesia type: general  Patient location: PACU  Post pain: Pain level controlled  Post assessment: Patient's Cardiovascular Status Stable  Last Vitals:  Filed Vitals:   08/15/14 1406  BP: 113/58  Pulse: 90  Temp:   Resp: 17    Post vital signs: Reviewed and stable  Level of consciousness: sedated  Complications: No apparent anesthesia complications

## 2014-08-15 NOTE — Transfer of Care (Signed)
Immediate Anesthesia Transfer of Care Note  Patient: Stephen Fitzgerald  Procedure(s) Performed: Procedure(s): AMPUTATION LEFT RING FINGER (Left) AMPUTATION RIGHT FOOT FIRST RAY AMPUTATION AND IRRIGATION AND DEBREDMENT OF RIGHT FOOT ABSCESS (Right)  Patient Location: PACU  Anesthesia Type:General  Level of Consciousness: awake and oriented  Airway & Oxygen Therapy: Patient Spontanous Breathing and Patient connected to nasal cannula oxygen  Post-op Assessment: Report given to RN  Post vital signs: Reviewed and stable  Last Vitals:  Filed Vitals:   08/15/14 1122  BP: 169/86  Pulse: 90  Temp: 36.6 C  Resp: 19    Complications: No apparent anesthesia complications

## 2014-08-15 NOTE — Progress Notes (Signed)
  Echocardiogram 2D Echocardiogram has been performed.  Danel Studzinski FRANCES 08/15/2014, 10:51 AM

## 2014-08-15 NOTE — Progress Notes (Signed)
Family Medicine Teaching Service Daily Progress Note Intern Pager: [phone]  Patient name: Stephen Fitzgerald Medical record number: [id number] Date of birth: Aug 13, 1960 Age: 54 y.o. Gender: male  Primary Care Provider: Levert Feinstein, MD Consultants: Ortho, VVS Code Status: full  Pt Overview and Major Events to Date:  1/26 - admit for osteomyelitis  Assessment and Plan:  Stephen Fitzgerald is a 54 y.o. male presenting with osteomyelitis and sepsis. 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  Sepsis criteria on admission by tachycardia, fever, foot/hand as source. Pt vitals have improved, now afebrile and tachycardia improved; does not appear clinically ill on exam. ED workup significant for normal lactic acid 1.82 and 1.09, WBC 8.5, electrolytes wnl. Plain films demonstrate bony destruction 4th DIP consistent with osteomyelitis, fracture base of right great toe and extensive soft tissue infection with gas, bony destruction medial margin head of first metatarsal.  - BCx - 1 of 2 with Staph species - continue ceftriaxone, flagyl, Vanc - consults to orthopedics, appreciate recs and assistance in management - plan for OR this afternoon - ABIs- R ant tib 1.68, R PT 1.15 (falsely elevated) - wound care consult - will defer to ortho/vasc - VVS consulting, appreciate recs - intact blood flow to R foot, not etiology of wet gangrene - apap and oxyIR for pain control  # T2DM: poorly controlled. A1c 11.6 on admission - CBG qac, qhs- 220s-300s O/N - SSI, moderate - Continue Lantus 8 units daily - Monitor closely in setting of surgery today  # Systolic Heart murmur:  First recognized in 07/2013.  - Echo pending  # HTN: Currently normotensive. - continue toprol xl   # HLD: Continue atorva  # Probable sebaceous cyst right  upper back: - pt declines any intervention for this  FEN/GI: carb mod diet / NS 75 cc/hr Prophylaxis: heparin sq  Disposition: Pending clinical improvement, ortho/VVS recs  Subjective:  Understands that he is going to OR later today. Has had diarrhea after getting Miralax.  Objective: Temp:  [98.3 F (36.8 C)-98.8 F (37.1 C)] 98.8 F (37.1 C) (01/30 0549) Pulse Rate:  [90-96] 95 (01/30 0549) Resp:  [18] 18 (01/30 0549) BP: (125-143)/(70-81) 143/81 mmHg (01/30 0549) SpO2:  [94 %-97 %] 94 % (01/30 0549) Physical Exam: General: NAD HEENT: PERRL, EOMI, MMM Cardiovascular: tachycardic, regular rhythm, normal s1s2, systolic murmur present. 2+ radial pulses. Left PT and DP pulses 2+, unable to palpate right PT and DP. Respiratory: clear bilaterally, normal effort Abdomen: soft, nontender, nondistended, normal bowel sounds Extremities: pitting edema right foot and ankle. Right foot: great toe black and necrotic down into area of distal metatarsal, erythematous and swollen to mid foot, tender up to lower part of ankle. Left 4th digit swollen, tender, necrotic DIP (SEE PICTURES in H+P) Neuro: alert, aphasia at baseline/word salad, understands language and questions well. No focal deficits otherwise noted  Laboratory:  Recent Labs Lab 08/12/14 0637 08/13/14 0917 08/14/14 0500  WBC 6.5 6.1 5.6  HGB 10.7* 10.9* 10.9*  HCT 32.6* 33.2* 33.4*  PLT 256 265 291    Recent Labs Lab 08/11/14 1644 08/12/14 0637 08/13/14 0917 08/14/14 0500  NA 135 136 138 140  K 5.1 4.2 4.3 4.3  CL 99 105 105 109  CO2 BUN 14 8 <5* 6  CREATININE 0.96 0.67  0.58 0.65  CALCIUM 8.7 7.9* 8.0* 8.2*  PROT 7.5  --   --   --   BILITOT 0.8  --   --   --   ALKPHOS 126*  --   --   --   ALT 13  --   --   --   AST 15  --   --   --   GLUCOSE 373* 224* 253* 239*    Lactic acid 1.09  Hgb A1c 11.6  Imaging/Diagnostic Tests: Dg Chest 1 View (1/26):  No active disease.  Dg Hand Complete Left  (1/26):  Bony destruction about the fourth DIP, suspicious for osteomyelitis  Dg Foot Complete Right (1/26):  Minimally displaced fracture at base of proximal phalanx RIGHT great toe extending intra-articular at first MTP joint. Extensive soft tissue infection by gas-forming organism extending from the level of the distal first metatarsal to the base of the distal phalanx great toe. Osteomyelitis with bone destruction at the medial margin of the head of the first metatarsal.   Shirlee LatchAngela Arlean Thies, MD 08/15/2014, 7:31 AM PGY-1, [facility] Family Medicine FPTS Intern pager: [phone], text pages welcome

## 2014-08-15 NOTE — Anesthesia Procedure Notes (Addendum)
Anesthesia Regional Block:  Popliteal block  Pre-Anesthetic Checklist: ,, timeout performed, Correct Patient, Correct Site, Correct Laterality, Correct Procedure, Correct Position, site marked, Risks and benefits discussed,  Surgical consent,  Pre-op evaluation,  At surgeon's request and post-op pain management  Laterality: Right  Prep: chloraprep       Needles:  Injection technique: Single-shot  Needle Type: Echogenic Stimulator Needle          Additional Needles:  Procedures: ultrasound guided (picture in chart) and nerve stimulator Popliteal block  Nerve Stimulator or Paresthesia:  Response: plantar flexion, 0.45 mA,   Additional Responses:   Narrative:  Start time: 08/15/2014 12:02 PM End time: 08/15/2014 12:12 PM Injection made incrementally with aspirations every 5 mL.  Performed by: Personally  Anesthesiologist: Heather RobertsSINGER, JAMES  Additional Notes: A functioning IV was confirmed and monitors were applied.  Sterile prep and drape, hand hygiene and sterile gloves were used.  Negative aspiration and test dose prior to incremental administration of local anesthetic. The patient tolerated the procedure well.Ultrasound  guidance: relevant anatomy identified, needle position confirmed, local anesthetic spread visualized around nerve(s), vascular puncture avoided.  Image printed for medical record.    Procedure Name: LMA Insertion Date/Time: 08/15/2014 12:28 PM Performed by: Jefm MilesENNIE, Jade Burkard E Pre-anesthesia Checklist: Patient identified, Emergency Drugs available, Suction available, Patient being monitored and Timeout performed Patient Re-evaluated:Patient Re-evaluated prior to inductionOxygen Delivery Method: Circle system utilized Preoxygenation: Pre-oxygenation with 100% oxygen Intubation Type: IV induction Ventilation: Mask ventilation without difficulty LMA: LMA inserted LMA Size: 4.0 Number of attempts: 1 Placement Confirmation: positive ETCO2 and breath sounds checked-  equal and bilateral Tube secured with: Tape Dental Injury: Teeth and Oropharynx as per pre-operative assessment

## 2014-08-15 NOTE — Brief Op Note (Signed)
08/11/2014 - 08/15/2014  1:30 PM  PATIENT:  Stephen Fitzgerald  54 y.o. male  PRE-OPERATIVE DIAGNOSIS:  PERIPHERAL VASCULAR DISEASE  POST-OPERATIVE DIAGNOSIS:  PERIPERAL VASCULAR DISEASE  PROCEDURE:  Procedure(s): AMPUTATION LEFT RING FINGER (Left) AMPUTATION RIGHT FOOT FIRST RAY AMPUTATION AND IRRIGATION AND DEBREDMENT OF RIGHT FOOT ABSCESS (Right)  SURGEON:  Surgeon(s) and Role:    * Kathryne Hitchhristopher Y Blackman, MD - Primary  ANESTHESIA:   local, regional and general  EBL:  Total I/O In: 500 [I.V.:500] Out: 400 [Urine:400]  BLOOD ADMINISTERED:none  DRAINS: none   LOCAL MEDICATIONS USED:  MARCAINE     SPECIMEN:  No Specimen  DISPOSITION OF SPECIMEN:  N/A  COUNTS:  YES  TOURNIQUET:   Total Tourniquet Time Documented: Calf (Right) - 11 minutes Total: Calf (Right) - 11 minutes   DICTATION: .Other Dictation: Dictation Number [id number]  PLAN OF CARE: Admit to inpatient   PATIENT DISPOSITION:  PACU - hemodynamically stable.   Delay start of Pharmacological VTE agent (>24hrs) due to surgical blood loss or risk of bleeding: no

## 2014-08-15 NOTE — Anesthesia Preprocedure Evaluation (Addendum)
Anesthesia Evaluation  Patient identified by MRN, date of birth, ID band  Reviewed: Allergy & Precautions, NPO status , Patient's Chart, lab work & pertinent test results, reviewed documented beta blocker date and time   Airway Mallampati: II  TM Distance: >3 FB     Dental  (+) Teeth Intact, Dental Advisory Given   Pulmonary    Pulmonary exam normal       Cardiovascular hypertension, Pt. on home beta blockers     Neuro/Psych CVA, Residual Symptoms    GI/Hepatic   Endo/Other  diabetes, Poorly Controlled, Type 2  Renal/GU      Musculoskeletal   Abdominal   Peds  Hematology   Anesthesia Other Findings   Reproductive/Obstetrics                            Anesthesia Physical Anesthesia Plan  ASA: III  Anesthesia Plan: General   Post-op Pain Management:    Induction: Intravenous  Airway Management Planned: LMA  Additional Equipment:   Intra-op Plan:   Post-operative Plan: Extubation in OR  Informed Consent: I have reviewed the patients History and Physical, chart, labs and discussed the procedure including the risks, benefits and alternatives for the proposed anesthesia with the patient or authorized representative who has indicated his/her understanding and acceptance.   Dental advisory given  Plan Discussed with: CRNA, Anesthesiologist and Surgeon  Anesthesia Plan Comments:         Anesthesia Quick Evaluation                                  Anesthesia Evaluation  Patient identified by MRN, date of birth, ID band Patient awake    Reviewed: Allergy & Precautions, H&P , NPO status , Patient's Chart, lab work & pertinent test results, reviewed documented beta blocker date and time   Airway Mallampati: I TM Distance: >3 FB Neck ROM: Full    Dental  (+) Teeth Intact and Dental Advisory Given   Pulmonary  breath sounds clear to auscultation        Cardiovascular hypertension, Pt. on medications and Pt. on home beta blockers Rhythm:Regular Rate:Normal     Neuro/Psych    GI/Hepatic   Endo/Other  diabetes, Well Controlled, Type 2, Oral Hypoglycemic Agents  Renal/GU      Musculoskeletal   Abdominal   Peds  Hematology   Anesthesia Other Findings   Reproductive/Obstetrics                           Anesthesia Physical Anesthesia Plan  ASA: III  Anesthesia Plan: General   Post-op Pain Management:    Induction: Intravenous  Airway Management Planned: LMA  Additional Equipment:   Intra-op Plan:   Post-operative Plan: Extubation in OR  Informed Consent: I have reviewed the patients History and Physical, chart, labs and discussed the procedure including the risks, benefits and alternatives for the proposed anesthesia with the patient or authorized representative who has indicated his/her understanding and acceptance.   Dental advisory given  Plan Discussed with: CRNA, Anesthesiologist and Surgeon  Anesthesia Plan Comments:         Anesthesia Quick Evaluation

## 2014-08-16 DIAGNOSIS — M86171 Other acute osteomyelitis, right ankle and foot: Secondary | ICD-10-CM

## 2014-08-16 LAB — CBC
HCT: 33 % — ABNORMAL LOW (ref 39.0–52.0)
HEMOGLOBIN: 10.7 g/dL — AB (ref 13.0–17.0)
MCH: 28.8 pg (ref 26.0–34.0)
MCHC: 32.4 g/dL (ref 30.0–36.0)
MCV: 88.7 fL (ref 78.0–100.0)
Platelets: 305 10*3/uL (ref 150–400)
RBC: 3.72 MIL/uL — ABNORMAL LOW (ref 4.22–5.81)
RDW: 12.7 % (ref 11.5–15.5)
WBC: 8.1 10*3/uL (ref 4.0–10.5)

## 2014-08-16 LAB — GLUCOSE, CAPILLARY
GLUCOSE-CAPILLARY: 277 mg/dL — AB (ref 70–99)
GLUCOSE-CAPILLARY: 268 mg/dL — AB (ref 70–99)
Glucose-Capillary: 174 mg/dL — ABNORMAL HIGH (ref 70–99)
Glucose-Capillary: 245 mg/dL — ABNORMAL HIGH (ref 70–99)

## 2014-08-16 MED ORDER — INSULIN ASPART 100 UNIT/ML ~~LOC~~ SOLN
0.0000 [IU] | Freq: Three times a day (TID) | SUBCUTANEOUS | Status: DC
  Administered 2014-08-16: 11 [IU] via SUBCUTANEOUS
  Administered 2014-08-16: 4 [IU] via SUBCUTANEOUS
  Administered 2014-08-17 (×2): 7 [IU] via SUBCUTANEOUS

## 2014-08-16 MED ORDER — INSULIN GLARGINE 100 UNIT/ML ~~LOC~~ SOLN
10.0000 [IU] | Freq: Every day | SUBCUTANEOUS | Status: DC
  Administered 2014-08-16 – 2014-08-17 (×2): 10 [IU] via SUBCUTANEOUS
  Filled 2014-08-16 (×2): qty 0.1

## 2014-08-16 NOTE — Progress Notes (Signed)
Family Medicine Teaching Service Daily Progress Note Intern Pager: [phone]  Patient name: Stephen Fitzgerald Medical record number: [id number] Date of birth: 1960/09/08 Age: 54 y.o. Gender: male  Primary Care Provider: Levert FeinsteinMcIntyre, Brittany, MD Consultants: Ortho, VVS Code Status: full  Pt Overview and Major Events to Date:  1/26 - admit for osteomyelitis 1/30: Right 1st toe ray resection & left 4th finger amputation.   Assessment and Plan:  Stephen Fitzgerald is a 54 y.o. male presenting with osteomyelitis and sepsis. 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 Hgb stable postop.  - BCx: 1 of 2 with coagulase negative staph. Other NGTD, suspect contaminant.  - Consider discontinuation of antibiotics: ceftriaxone, flagyl, Vanc - wound care consult - will defer to ortho/vasc - VVS consulting, appreciate recs - intact blood flow to R foot, not etiology of wet gangrene  - Pain control: Percocet 5/325mg  1-2 tabs q4h prn pain and morphine 2mg  IV q3h prn pain.   # T2DM: poorly controlled. A1c 11.6 on admission + infectious nidus exacerbating this.  - Only 10 units SSI given in past 24 hours with range of 196-277.  - Increase lantus to 10 units daily (8 + 20% SSI requirement) and increase SSI to resistant scale - Goal post-operative CBG < 140mg /dl fasting  # Systolic Heart murmur:  First recognized in 07/2013. Euvolemic.  - Echo without valvular lesions but normal EF and grade I DD.  # HTN: Currently normotensive. - continue toprol xl 50mg   # HLD: Continue atorva  # Probable sebaceous cyst right upper back: - pt declines any intervention for this  FEN/GI: carb mod diet / NS 75 cc/hr Prophylaxis: heparin sq  Disposition: Pending postoperative recovery, ortho/VVS recs  Subjective:  Pain is controlled, slept ok overnight. Had  early satiety this AM. + flatus, voiding normally.   Objective: Temp:  [97 F (36.1 C)-98.7 F (37.1 C)] 98.3 F (36.8 C) (01/31 0347) Pulse Rate:  [84-103] 103 (01/31 0347) Resp:  [14-19] 18 (01/31 0347) BP: (111-169)/(58-86) 158/72 mmHg (01/31 0347) SpO2:  [96 %-100 %] 97 % (01/31 0347) Physical Exam: General: Overweight man laying in bed in NAD HEENT: PERRL, EOMI, MMM Cardiovascular: RRR, normal s1s2, systolic murmur present. 2+ radial pulses. Left PT and DP pulses 1+, unable to palpate right PT and DP. Respiratory: clear bilaterally, normal effort Abdomen: soft, nontender, nondistended, normal bowel sounds Extremities: pitting edema right foot and ankle. Bandage over surgical wound of R foot c/d/i. Left 4th digit with bandage covering surgical site c/d/i  Neuro: alert, aphasia at baseline/word salad, understands language and questions well. No focal deficits otherwise noted  Laboratory:  Recent Labs Lab 08/13/14 0917 08/14/14 0500 08/16/14 0310  WBC 6.1 5.6 8.1  HGB 10.9* 10.9* 10.7*  HCT 33.2* 33.4* 33.0*  PLT 265 291 305    Recent Labs Lab 08/11/14 1644 08/12/14 0637 08/13/14 0917 08/14/14 0500  NA 135 136 138 140  K 5.1 4.2 4.3 4.3  CL 99 105 105 109  CO2 23 22 24 24   BUN 14 8 <5* 6  CREATININE 0.96 0.67 0.58 0.65  CALCIUM 8.7 7.9* 8.0* 8.2*  PROT 7.5  --   --   --   BILITOT 0.8  --   --   --   ALKPHOS 126*  --   --   --   ALT  13  --   --   --   AST 15  --   --   --   GLUCOSE 373* 224* 253* 239*    Lactic acid 1.09  Hgb A1c 11.6  Imaging/Diagnostic Tests: Dg Chest 1 View (1/26):  No active disease.  Dg Hand Complete Left (1/26):  Bony destruction about the fourth DIP, suspicious for osteomyelitis  Dg Foot Complete Right (1/26):  Minimally displaced fracture at base of proximal phalanx RIGHT great toe extending intra-articular at first MTP joint. Extensive soft tissue infection by gas-forming organism extending from the level of the distal  first metatarsal to the base of the distal phalanx great toe. Osteomyelitis with bone destruction at the medial margin of the head of the first metatarsal.   Tyrone Nine, MD 08/16/2014, 6:51 AM PGY-2, [facility] FPTS Intern pager: [phone], text pages welcome

## 2014-08-16 NOTE — Progress Notes (Signed)
CSW spoke with patient's father Genevie CheshireBilly and bed offers given.  [facility] facility but they do not have an opening. [facility].  Patient is agreeable to d/c there when stable.  CSW will need to monitor for date of stability.  Lorri Frederickonna T. Jaci LazierCrowder, KentuckyLCSW [phone]

## 2014-08-16 NOTE — Op Note (Signed)
NAMCharlynne Fitzgerald:  Fitzgerald, Stephen Fitzgerald           ACCOUNT NO.:  [id number]  MEDICAL RECORD NO.:  [id number]  LOCATION:  2W17C                        FACILITY:  [facility]  PHYSICIAN:  Vanita PandaChristopher Y. Magnus IvanBlackman, M.D.DATE OF BIRTH:  03/17/1961  DATE OF PROCEDURE:  08/15/2014 DATE OF DISCHARGE:                              OPERATIVE REPORT   PREOPERATIVE DIAGNOSES: 1. Right foot ischemia, infection, and first ray necrosis. 2. Left ring finger infection, necrosis, and peripheral vascular     disease.  POSTOPERATIVE DIAGNOSES: 1. Right foot ischemia, infection, and first ray necrosis. 2. Left ring finger infection, necrosis, and peripheral vascular     disease.  PROCEDURES: 1. Sharp excisional debridement of skin, soft tissue, muscle, and     bone, right foot. 2. Right foot first ray resection. 3. Left ring finger amputation through middle phalanx.  SURGEON:  Vanita PandaChristopher Y. Magnus IvanBlackman, M.D.  ANESTHESIA: 1. Right leg popliteal block. 2. General. 3. Digital block, left ring finger with 0.25% plain Marcaine.  BLOOD LOSS:  Less than 100 mL.  COMPLICATIONS:  None.  INDICATIONS:  [name] is a 54 year old diabetic with severe peripheral vascular disease.  He has developed osteomyelitis and infection with ischemic changes of his left hand ring finger and his right foot first ray.  He has necrotic great toe on his right foot.  The end of his ring finger is chronic on his left hand.  At this point with his poor vascular status and ongoing infection, it is recommended he undergo at minimum an I and D of his right foot and a first ray resection with possible need for transmetatarsal amputation and a left hand ring finger amputation through the middle phalanx.  He understands the risks and benefits of this, and the fact that he may need further surgery if he is unable to heal both areas.  PROCEDURE DESCRIPTION:  After informed consent was obtained and both his right foot and left hand were marked,  anesthesia was obtained with popliteal block on the right leg, he was then brought to the operating room, placed supine on the operating table.  Attention was turned first to the right foot.  It was prepped and draped with DuraPrep and sterile drapes.  Time-out was called to identify the correct patient, correct right foot.  I then did a wide excision around the first ray, encountering abscess under the metatarsal head and necrotic toe at the end.  After the wide resection, we used oscillating saw to make metatarsal cut quite proximal to first metatarsal.  We then used a #10 blade to excise necrotic skin, soft tissue, and muscle sharply.  Once we removed the areas of abscess, he had good brisk bleeding tissue surprisingly.  We then used 3 L of pulsatile lavage solution to lavage this part of the foot and then reapproximated the skin flap with interrupted 2-0 nylon suture.  Looking the rest of the foot, it did look stable, however, there is a high likelihood that he may not heal this at all and end up with either transmetatarsal amputation or below-knee amputation.  We then placed Xeroform and a well-padded sterile dressing on the foot.  Attention was then turned to the hand.  After prepping and draping  of the hand with DuraPrep and sterile drapes, we provided a digital block with 1% plain Marcaine around the left hand ring finger. We then used a Penrose drain as a local tourniquet and performed elliptical type of incision with fish-mouth incision along the lateral borders where we then used a bone cutting forceps and removed the bone through the middle phalanx.  I then identified and cauterized the neurovascular bundles.  I irrigated this tissue and then loosely reapproximated the skin with interrupted 3-0 nylon suture.  We let the Penrose drain down from around the finger that was using as a local tourniquet.  Hemostasis was obtained.  We then used the Xeroform and well-padded sterile  dressing around the left hand ring finger.  The patient was then awakened, extubated, and taken to recovery room in stable condition.  All final counts were correct.  There were no complications noted.     Vanita Panda. Magnus Ivan, M.D.     CYB/MEDQ  D:  08/15/2014  T:  08/16/2014  Job:  [id number]

## 2014-08-16 NOTE — Progress Notes (Signed)
PT Cancellation Note  Patient Details Name: Stephen SprinklesJeffrey W Fitzgerald MRN: [id number] DOB: 06/02/1961   Cancelled Treatment:    Reason Eval/Treat Not Completed: Medical issues which prohibited therapy.  Per orders, patient remains on bedrest.  Patient s/p Rt 1st ray amputation on 08/15/14.  Will await post-op orders for activity and weight bearing status.  MD:  Please write orders when appropriate for patient.  PT will initiate evaluation at that time.  Thank you.   [name], [name] 08/16/2014, 9:41 AM Durenda HurtSusan H. Renaldo Fiddleravis, PT, [facility] Acute Rehab Services Pager [phone]

## 2014-08-16 NOTE — Progress Notes (Signed)
Utilization review completed.  

## 2014-08-17 ENCOUNTER — Encounter (HOSPITAL_COMMUNITY): Payer: Self-pay | Admitting: Orthopaedic Surgery

## 2014-08-17 DIAGNOSIS — M86171 Other acute osteomyelitis, right ankle and foot: Secondary | ICD-10-CM | POA: Diagnosis not present

## 2014-08-17 DIAGNOSIS — E785 Hyperlipidemia, unspecified: Secondary | ICD-10-CM | POA: Diagnosis not present

## 2014-08-17 DIAGNOSIS — A419 Sepsis, unspecified organism: Secondary | ICD-10-CM | POA: Diagnosis not present

## 2014-08-17 DIAGNOSIS — M869 Osteomyelitis, unspecified: Secondary | ICD-10-CM | POA: Insufficient documentation

## 2014-08-17 DIAGNOSIS — K5900 Constipation, unspecified: Secondary | ICD-10-CM | POA: Diagnosis not present

## 2014-08-17 DIAGNOSIS — R278 Other lack of coordination: Secondary | ICD-10-CM | POA: Diagnosis not present

## 2014-08-17 DIAGNOSIS — E118 Type 2 diabetes mellitus with unspecified complications: Secondary | ICD-10-CM | POA: Diagnosis not present

## 2014-08-17 DIAGNOSIS — E119 Type 2 diabetes mellitus without complications: Secondary | ICD-10-CM | POA: Diagnosis not present

## 2014-08-17 DIAGNOSIS — Z7982 Long term (current) use of aspirin: Secondary | ICD-10-CM | POA: Diagnosis not present

## 2014-08-17 DIAGNOSIS — E538 Deficiency of other specified B group vitamins: Secondary | ICD-10-CM | POA: Diagnosis not present

## 2014-08-17 DIAGNOSIS — Z4781 Encounter for orthopedic aftercare following surgical amputation: Secondary | ICD-10-CM | POA: Diagnosis not present

## 2014-08-17 DIAGNOSIS — K30 Functional dyspepsia: Secondary | ICD-10-CM | POA: Diagnosis not present

## 2014-08-17 DIAGNOSIS — I6990 Unspecified sequelae of unspecified cerebrovascular disease: Secondary | ICD-10-CM | POA: Diagnosis not present

## 2014-08-17 DIAGNOSIS — Z794 Long term (current) use of insulin: Secondary | ICD-10-CM | POA: Diagnosis not present

## 2014-08-17 DIAGNOSIS — IMO0001 Reserved for inherently not codable concepts without codable children: Secondary | ICD-10-CM | POA: Insufficient documentation

## 2014-08-17 DIAGNOSIS — M6281 Muscle weakness (generalized): Secondary | ICD-10-CM | POA: Diagnosis not present

## 2014-08-17 DIAGNOSIS — R52 Pain, unspecified: Secondary | ICD-10-CM | POA: Diagnosis not present

## 2014-08-17 DIAGNOSIS — Z89422 Acquired absence of other left toe(s): Secondary | ICD-10-CM | POA: Diagnosis not present

## 2014-08-17 DIAGNOSIS — I10 Essential (primary) hypertension: Secondary | ICD-10-CM | POA: Diagnosis not present

## 2014-08-17 DIAGNOSIS — S68119D Complete traumatic metacarpophalangeal amputation of unspecified finger, subsequent encounter: Secondary | ICD-10-CM | POA: Diagnosis not present

## 2014-08-17 DIAGNOSIS — R262 Difficulty in walking, not elsewhere classified: Secondary | ICD-10-CM | POA: Diagnosis not present

## 2014-08-17 DIAGNOSIS — R748 Abnormal levels of other serum enzymes: Secondary | ICD-10-CM | POA: Diagnosis not present

## 2014-08-17 DIAGNOSIS — Z8739 Personal history of other diseases of the musculoskeletal system and connective tissue: Secondary | ICD-10-CM | POA: Diagnosis not present

## 2014-08-17 DIAGNOSIS — E8809 Other disorders of plasma-protein metabolism, not elsewhere classified: Secondary | ICD-10-CM | POA: Diagnosis not present

## 2014-08-17 DIAGNOSIS — I350 Nonrheumatic aortic (valve) stenosis: Secondary | ICD-10-CM | POA: Diagnosis not present

## 2014-08-17 DIAGNOSIS — Z89411 Acquired absence of right great toe: Secondary | ICD-10-CM | POA: Diagnosis not present

## 2014-08-17 DIAGNOSIS — Z8673 Personal history of transient ischemic attack (TIA), and cerebral infarction without residual deficits: Secondary | ICD-10-CM | POA: Diagnosis not present

## 2014-08-17 DIAGNOSIS — Z89022 Acquired absence of left finger(s): Secondary | ICD-10-CM | POA: Diagnosis not present

## 2014-08-17 DIAGNOSIS — R4701 Aphasia: Secondary | ICD-10-CM | POA: Diagnosis not present

## 2014-08-17 LAB — GLUCOSE, CAPILLARY
GLUCOSE-CAPILLARY: 233 mg/dL — AB (ref 70–99)
GLUCOSE-CAPILLARY: 244 mg/dL — AB (ref 70–99)
Glucose-Capillary: 243 mg/dL — ABNORMAL HIGH (ref 70–99)

## 2014-08-17 MED ORDER — OXYCODONE-ACETAMINOPHEN 5-325 MG PO TABS: 1.0000 | ORAL_TABLET | ORAL | Status: DC | PRN

## 2014-08-17 MED ORDER — INSULIN GLARGINE 100 UNIT/ML ~~LOC~~ SOLN: 10.0000 [IU] | Freq: Every day | SUBCUTANEOUS | Status: DC

## 2014-08-17 NOTE — Discharge Instructions (Signed)
Leave your left hand and right foot dressings on and keep them clean and dry. They need to stay on until you follow up with Orthopedics in one week.   Needs continued PT at the SNF.   Needs continued management of blood glucose as an outpatient.   See discharge summary for further details.

## 2014-08-17 NOTE — Progress Notes (Signed)
Patient ID: Stephen Fitzgerald, male   DOB: 02/09/1961, 54 y.o.   MRN: [id number] No acute changes overnight from ortho standpoint.  Can be discharge to home per primary team with leaving his dressings on and alone, clean, and dry for the next week.

## 2014-08-17 NOTE — Evaluation (Signed)
Physical Therapy Evaluation Patient Details Name: Stephen SprinklesJeffrey W Fitzgerald MRN: [id number] DOB: 1960-08-10 Today's Date: 08/17/2014   History of Present Illness  Pt admitted with necrotic left ring finger and right toe s/p Left ring finger partial amputation and right foot 1st ray amputation  Clinical Impression  Pt very pleasant and receptive to instruction but has difficulty retaining instructions for mobility and gait. Pt required assist to don shoe and Darco for stability with gait. Pt lives alone and states father pops in occasionally. Pt will benefit from acute therapy to maximize mobility, gait, balance and function to decrease burden of care.      Follow Up Recommendations SNF;Supervision for mobility/OOB    Equipment Recommendations  Rolling walker with 5" wheels    Recommendations for Other Services       Precautions / Restrictions Precautions Precautions: Fall Required Braces or Orthoses: Other Brace/Splint Other Brace/Splint: Darco shoe Right foot Restrictions Weight Bearing Restrictions: Yes RLE Weight Bearing: Weight bearing as tolerated      Mobility  Bed Mobility Overal bed mobility: Needs Assistance Bed Mobility: Supine to Sit     Supine to sit: Supervision     General bed mobility comments: cues for sequence without physical assist  Transfers Overall transfer level: Needs assistance   Transfers: Sit to/from Stand Sit to Stand: Min assist         General transfer comment: cues for hand placement and safety with min assist for balance with rising  Ambulation/Gait Ambulation/Gait assistance: Min guard Ambulation Distance (Feet): 60 Feet Assistive device: Rolling walker (2 wheeled) Gait Pattern/deviations: Step-to pattern;Decreased stance time - right   Gait velocity interpretation: Below normal speed for age/gender General Gait Details: cues for sequence, weight bearing on heel and stability with Darco shoe. Pt maintaining weight on heel of left  hand but requires DME for balance with DArco  Stairs            Wheelchair Mobility    Modified Rankin (Stroke Patients Only)       Balance Overall balance assessment: Needs assistance   Sitting balance-Leahy Scale: Good       Standing balance-Leahy Scale: Poor                               Pertinent Vitals/Pain Pain Assessment: No/denies pain    Home Living Family/patient expects to be discharged to:: Private residence Living Arrangements: Alone Available Help at Discharge: Family;Available PRN/intermittently Type of Home: Apartment Home Access: Level entry     Home Layout: One level Home Equipment: None      Prior Function Level of Independence: Independent               Hand Dominance   Dominant Hand: Right    Extremity/Trunk Assessment   Upper Extremity Assessment: Defer to OT evaluation           Lower Extremity Assessment: Generalized weakness      Cervical / Trunk Assessment: Normal  Communication   Communication: No difficulties  Cognition Arousal/Alertness: Awake/alert Behavior During Therapy: WFL for tasks assessed/performed Overall Cognitive Status: Impaired/Different from baseline Area of Impairment: Safety/judgement     Memory: Decreased recall of precautions   Safety/Judgement: Decreased awareness of safety          General Comments      Exercises        Assessment/Plan    PT Assessment Patient needs continued PT services  PT Diagnosis Difficulty  walking   PT Problem List Decreased strength;Decreased activity tolerance;Decreased knowledge of precautions;Decreased knowledge of use of DME  PT Treatment Interventions Gait training;Functional mobility training;Therapeutic activities;Therapeutic exercise;Balance training;Patient/family education;DME instruction   PT Goals (Current goals can be found in the Care Plan section) Acute Rehab PT Goals Patient Stated Goal: return to the beach and  shopping at the mall PT Goal Formulation: With patient Time For Goal Achievement: 08/31/14 Potential to Achieve Goals: Fair    Frequency Min 3X/week   Barriers to discharge Decreased caregiver support      Co-evaluation               End of Session Equipment Utilized During Treatment: Gait belt;Other (comment) (Darco shoe) Activity Tolerance: Patient tolerated treatment well Patient left: in chair;with call bell/phone within reach Nurse Communication: Mobility status         Time: 9562-1308 PT Time Calculation (min) (ACUTE ONLY): 18 min   Charges:   PT Evaluation $Initial PT Evaluation Tier I: 1 Procedure PT Treatments $Gait Training: 8-22 mins   PT G CodesDelorse Lek 08/17/2014, 10:46 AM Delaney Meigs, PT [phone]

## 2014-08-17 NOTE — Progress Notes (Signed)
[facility] Report called and given to nurse at facility Discharge instructions given to father Education discussed  IV dc'd  Tele dc'd  Pt discharged via wheelchair with RN All pt belongs at side. Darrel HooverWilson,Forbes Loll S 4:17 PM

## 2014-08-17 NOTE — Progress Notes (Signed)
Medicare Important Message given? YES  (If response is "NO", the following Medicare IM given date fields will be blank)  Date Medicare IM given: 08/17/14 Medicare IM given by:  Canda Podgorski  

## 2014-08-17 NOTE — Evaluation (Signed)
Occupational Therapy Evaluation Patient Details Name: Stephen SprinklesJeffrey W Platts MRN: [id number] DOB: 1960/09/24 Today's Date: 08/17/2014    History of Present Illness Pt admitted with necrotic left ring finger and right toe s/p Left ring finger partial amputation and right foot 1st ray amputation   Clinical Impression   Pt admitted with the above diagnosis and has the deficits listed below. Pt would benefit from cont OT to increase I and safety with all adls so he can eventually dc home by himself.     Follow Up Recommendations  SNF;Supervision/Assistance - 24 hour    Equipment Recommendations  Other (comment) (TBD. Pt would benefit from tub bench but he refuses.)    Recommendations for Other Services       Precautions / Restrictions Precautions Precautions: Fall Required Braces or Orthoses: Other Brace/Splint Other Brace/Splint: Darco shoe Right foot Restrictions Weight Bearing Restrictions: Yes RLE Weight Bearing: Weight bearing as tolerated Other Position/Activity Restrictions: Left sticky note for MD re: WB through L hand.      Mobility Bed Mobility Overal bed mobility: Needs Assistance Bed Mobility: Supine to Sit     Supine to sit: Supervision     General bed mobility comments: pt in chair on arrival.  Transfers Overall transfer level: Needs assistance Equipment used: Rolling walker (2 wheeled) Transfers: Sit to/from UGI CorporationStand;Stand Pivot Transfers Sit to Stand: Min assist Stand pivot transfers: Min assist       General transfer comment: cues for hand placement and safety with min assist for balance with rising    Balance Overall balance assessment: Needs assistance Sitting-balance support: Single extremity supported;Feet supported Sitting balance-Leahy Scale: Good     Standing balance support: Bilateral upper extremity supported;During functional activity Standing balance-Leahy Scale: Poor Standing balance comment: Pt requires use of walker or sink or outside  support to remain standing.  Pt attempted to pull pants up and let go of walker and needed assist maintaiining balance.                            ADL Overall ADL's : Needs assistance/impaired Eating/Feeding: Independent   Grooming: Oral care;Wash/dry face;Wash/dry hands;Standing;Supervision/safety   Upper Body Bathing: Set up;Sitting   Lower Body Bathing: Min guard;Sit to/from stand Lower Body Bathing Details (indicate cue type and reason): min guard when standing due to decreased balance. Upper Body Dressing : Set up;Sitting   Lower Body Dressing: Min guard;Sit to/from stand Lower Body Dressing Details (indicate cue type and reason): Pt donned boot and shoe and socks w/o assist. Pt only required assist to stand and pull pants up because of decreased balance. Toilet Transfer: Minimal assistance;Ambulation;Comfort height toilet;Grab bars Toilet Transfer Details (indicate cue type and reason): min assist for balance and cues to limit weight bearing through L hand if possible. Toileting- ArchitectClothing Manipulation and Hygiene: Min guard;Sit to/from stand       Functional mobility during ADLs: Minimal assistance;Rolling walker General ADL Comments: Pt overall did well with adls. Pt is impulsive and has decreased balance and safety awareness when he is on his feet.     Vision                     Perception     Praxis      Pertinent Vitals/Pain Pain Assessment: No/denies pain     Hand Dominance Right   Extremity/Trunk Assessment Upper Extremity Assessment Upper Extremity Assessment: LUE deficits/detail LUE Deficits / Details: Shoulder and elbow WFL.  Pt with surgery to L hand 18 months ago and pt has been limited with this L hand ever since.  Pt with limited thumb and index ROM with obvious contractures.  Pt can oppose thumb and index but  has no grip strength.  LUE: Unable to fully assess due to immobilization LUE Sensation: decreased light touch;history of  peripheral neuropathy LUE Coordination: decreased fine motor;decreased gross motor   Lower Extremity Assessment Lower Extremity Assessment: Defer to PT evaluation   Cervical / Trunk Assessment Cervical / Trunk Assessment: Normal   Communication Communication Communication: No difficulties;Expressive difficulties   Cognition Arousal/Alertness: Awake/alert Behavior During Therapy: WFL for tasks assessed/performed Overall Cognitive Status: Impaired/Different from baseline Area of Impairment: Safety/judgement     Memory: Decreased recall of precautions   Safety/Judgement: Decreased awareness of safety     General Comments: Pt overall does not seem to think his condition is serious.  Pt wants to drive asap and wants to walk without walker asap.  Pts insight very limited.   General Comments       Exercises       Shoulder Instructions      Home Living Family/patient expects to be discharged to:: Private residence Living Arrangements: Alone Available Help at Discharge: Family;Available PRN/intermittently Type of Home: Apartment Home Access: Level entry     Home Layout: One level     Bathroom Shower/Tub: Tub/shower unit;Curtain Shower/tub characteristics: Engineer, building services: Standard     Home Equipment: None   Additional Comments: Pt very resistant to any equipment.  Pt states getting in and out of tub shower has been difficult lately.  Mentioned a tub bench and pt is adament that he does not want this.  Pt states by the time he is done with rehab he better not need a walker bc he will not get one.      Prior Functioning/Environment Level of Independence: Independent             OT Diagnosis: Generalized weakness;Cognitive deficits   OT Problem List: Decreased strength;Decreased range of motion;Decreased activity tolerance;Impaired balance (sitting and/or standing);Decreased cognition;Decreased safety awareness;Decreased knowledge of use of DME or  AE;Decreased knowledge of precautions;Impaired UE functional use   OT Treatment/Interventions: Self-care/ADL training;DME and/or AE instruction;Therapeutic activities    OT Goals(Current goals can be found in the care plan section) Acute Rehab OT Goals Patient Stated Goal: return to the beach and shopping at the mall OT Goal Formulation: With patient Time For Goal Achievement: 08/31/14 Potential to Achieve Goals: Good ADL Goals Pt Will Perform Lower Body Dressing: with modified independence;sit to/from stand Pt Will Perform Tub/Shower Transfer: Tub transfer;rolling walker;ambulating;with min guard assist Additional ADL Goal #1: Pt will complete al aspects of toileting with mod I   OT Frequency: Min 2X/week   Barriers to D/C: Decreased caregiver support  Pt lives alone       Co-evaluation              End of Session Equipment Utilized During Treatment: Engineer, water Communication: Mobility status  Activity Tolerance: Patient limited by fatigue Patient left: in chair;with call bell/phone within reach   Time: 1041-1108 OT Time Calculation (min): 27 min Charges:  OT General Charges $OT Visit: 1 Procedure OT Evaluation $Initial OT Evaluation Tier I: 1 Procedure OT Treatments $Self Care/Home Management : 8-22 mins G-Codes:    Hope Budds Aug 23, 2014, 11:31 AM  [phone]

## 2014-08-17 NOTE — Progress Notes (Signed)
Inpatient Diabetes Program Recommendations  AACE/ADA: New Consensus Statement on Inpatient Glycemic Control (2013)  Target Ranges:  Prepandial:   less than 140 mg/dL      Peak postprandial:   less than 180 mg/dL (1-2 hours)      Critically ill patients:  140 - 180 mg/dL   Reason for Assessment:  Results for Stephen Fitzgerald, Stephen Fitzgerald (MRN [id number]) as of 08/17/2014 12:45  Ref. Range 08/16/2014 12:06 08/16/2014 16:07 08/16/2014 21:11 08/17/2014 06:20 08/17/2014 11:29  Glucose-Capillary Latest Range: 70-99 mg/dL 409174 (H) 811268 (H) 914245 (H) 243 (H) 244 (H)    CBG's continue to be greater than goal.  Consider further increase of Lantus to 15 units daily.  Based on CBG's and A1C, patient likely needs insulin at discharge also.  It appears that he will be discharged to SNF, where this can be administered.   Thanks, Beryl MeagerJenny Maleko Greulich, RN, BC-ADM Inpatient Diabetes Coordinator Pager [phone]

## 2014-08-18 ENCOUNTER — Non-Acute Institutional Stay: Payer: Medicare Other | Admitting: Family Medicine

## 2014-08-18 ENCOUNTER — Encounter: Payer: Self-pay | Admitting: Family Medicine

## 2014-08-18 DIAGNOSIS — I6990 Unspecified sequelae of unspecified cerebrovascular disease: Secondary | ICD-10-CM | POA: Diagnosis not present

## 2014-08-18 DIAGNOSIS — E538 Deficiency of other specified B group vitamins: Secondary | ICD-10-CM | POA: Diagnosis not present

## 2014-08-18 DIAGNOSIS — R4701 Aphasia: Secondary | ICD-10-CM

## 2014-08-18 DIAGNOSIS — E785 Hyperlipidemia, unspecified: Secondary | ICD-10-CM

## 2014-08-18 DIAGNOSIS — M869 Osteomyelitis, unspecified: Secondary | ICD-10-CM

## 2014-08-18 DIAGNOSIS — E118 Type 2 diabetes mellitus with unspecified complications: Secondary | ICD-10-CM | POA: Diagnosis not present

## 2014-08-18 DIAGNOSIS — I10 Essential (primary) hypertension: Secondary | ICD-10-CM

## 2014-08-18 DIAGNOSIS — M86171 Other acute osteomyelitis, right ankle and foot: Secondary | ICD-10-CM | POA: Diagnosis not present

## 2014-08-18 DIAGNOSIS — E1165 Type 2 diabetes mellitus with hyperglycemia: Secondary | ICD-10-CM

## 2014-08-18 DIAGNOSIS — IMO0002 Reserved for concepts with insufficient information to code with codable children: Secondary | ICD-10-CM

## 2014-08-18 HISTORY — DX: Deficiency of other specified B group vitamins: E53.8

## 2014-08-18 LAB — CULTURE, BLOOD (ROUTINE X 2): Culture: NO GROWTH

## 2014-08-18 NOTE — Assessment & Plan Note (Addendum)
Receiving therapies for diabetes, hypertension, and hyperlipidemia.  Taking an aspirin daily.

## 2014-08-18 NOTE — Assessment & Plan Note (Signed)
POD #3 S/p amputation right foot first ray for osteomyelitis.  No further antibiotic therapy planned.  [facility] Keep Dressing in place alone, Dry and intact. Patient in Darco shoe. May weight bear.   Follow up one week for post-op check with Dr Magnus IvanBlackman (ortho)

## 2014-08-18 NOTE — Assessment & Plan Note (Signed)
Lab Results  Component Value Date   LDLCALC 88 04/02/2014   Tolerating Atorvastatin.  Will continue.

## 2014-08-18 NOTE — Assessment & Plan Note (Signed)
POD#3 S/P left hand 4th digit amputation for osteomyelitis. No further antibiotic therapy planned. Keep current wound dressing in place, dry and intact until seen by Dr Magnus IvanBlackman (Ortho) next week for post-op recheck.  [facility].

## 2014-08-18 NOTE — Assessment & Plan Note (Signed)
Stable. Asked NH Speech Therapy to see if there is any communication assist devices that [name] may use to be able to express his needs to staff.

## 2014-08-18 NOTE — Assessment & Plan Note (Addendum)
Lab Results  Component Value Date   HGBA1C 11.6* 08/11/2014   Inadequate glycemic control. Patient's metformin restarted at discharge.   Pt started on Lantus 10 units [place] qhs during hospitalization. Fasting CBG this morning 156 with Pre-lunch of 454 Started NH facility standard Novulin SSI then consider addition of prandial insulin. Will need to titrate up metformin during NH stay. May start ACEI during stay.  Patient should be offered PSV-23 vaccination before Discharge.

## 2014-08-18 NOTE — Assessment & Plan Note (Signed)
Continue Metoprolol. Consider addition of ACE inhibitor if further BP control needed.

## 2014-08-18 NOTE — Progress Notes (Signed)
Patient ID: Stephen Fitzgerald, male   DOB: December 25, 1960, 54 y.o.   MRN: [id number] [facility]  Visit for Admission  Provider: Family Medicine Geriatric Resident Location of Care: [facility] Visit Information: Admission Patient accompanied by patient Source(s) of information for visit: patient, nursing home and past medical records  Chief Complaint:  Chief Complaint  Patient presents with  . Hand Injury  . Foot Injury    Nursing Concerns: Needs Sliding Scale Insulin orders Nutrition Concerns: None Wound Care Nurse Concerns: None PT / OT Concerns: NH PT and OT have not evaluated patient yet.    Hospital PT - Weight Bearing as tolerated. Use Rolling Walker with 5 wheels. Recommended Gait training, Functional mobility training, Therapeutic activities, Balance training, Patient and Family Education; DME eduction Wound Care: Dr Magnus Ivan at discharge want patient to leave dressings on and alone, clean and dry for next week.  HISTORY OF PRESENT ILLNESS:  Stephen Fitzgerald is 4 days post op for Right foot first Ray amputation and left index finger amputations for osteomyelitis in both locations.  Pt has uncontrolled DMT2. Patient had a Aortogram for  lower extremity PAD assessment on 08/13/13. Patient showed arterial patency on right extremity down to Peroneal and Anterior tibial arterites with good blood flow to right great toe. Left extremity showed similar patencies.   Stephen Fitzgerald had an uneventual post-op course.  He was started on Lantus 10 units at bedtime with a resistant Sliding Scale Insulin protocol during the admission. His A1c was 11.6% on admission.    Stephen Fitzgerald lives alone.   He is admitted to [facility] for PT and OT rehabilitation.   Outpatient Encounter Prescriptions as of 08/18/2014  Medication Sig  . vitamin B-12 (CYANOCOBALAMIN) 1000 MCG tablet Take 1,000 mcg by mouth daily.  Marland Kitchen aspirin 325 MG tablet Take 325 mg by mouth daily.  Marland Kitchen atorvastatin  (LIPITOR) 40 MG tablet TAKE 1 TABLET BY MOUTH DAILY  . insulin glargine (LANTUS) 100 UNIT/ML injection Inject 0.1 mLs (10 Units total) into the skin daily.  . metFORMIN (GLUCOPHAGE-XR) 500 MG 24 hr tablet TAKE 2 TABLETS BY MOUTH DAILY WITH BREAKFAST  . metoprolol succinate (TOPROL-XL) 50 MG 24 hr tablet TAKE 1 TABLET BY MOUTH DAILY  . oxyCODONE-acetaminophen (PERCOCET/ROXICET) 5-325 MG per tablet Take 1-2 tablets by mouth every 4 (four) hours as needed for moderate pain.  . [DISCONTINUED] vitamin B-12 (CYANOCOBALAMIN) 100 MCG tablet Take 50 mcg by mouth daily.   Allergies  Allergen Reactions  . Unasyn [Ampicillin-Sulbactam Sodium] Rash   History Patient Active Problem List   Diagnosis Date Noted  . Vitamin B12 deficiency 08/18/2014  . Osteomyelitis of finger   . Heart murmur, systolic   . Osteomyelitis of ankle or foot, acute 08/11/2014  . Heart murmur 07/24/2013  . Hyperlipidemia 03/06/2013  . Hypertension 03/06/2013  . Tachycardia 01/13/2013  . Type II or unspecified type diabetes mellitus with unspecified complication, uncontrolled 12/08/2012  . Expressive aphasia syndrome 12/08/2012  . 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 Diabetes mellitus without complication   . Stroke     expressive aphasia  . Hypertension   . Flexor tenosynovitis of finger 12/08/2012  . Blood poisoning   . Bacteremia   . Abscess of hand, left 12/08/2012   Past Surgical History  Procedure Laterality Date  . No past surgeries    . I&d extremity Left 12/04/2012    Procedure: IRRIGATION AND DEBRIDEMENT Left Hand with  Ring Removal  times three, Carpal Tunnel , Flexor tendon Synovectomy;  Surgeon: Jodi Marbleavid A Thompson, MD;  [facility];  Service: Orthopedics;  Laterality: Left;  . Amputation Left 12/06/2012    Procedure: AMPUTATION DIGIT LEFT INDEX;  Surgeon: Jodi Marbleavid A Thompson, MD;  [facility];  Service: Orthopedics;  Laterality: Left;  . I&d  extremity Left 12/06/2012    Procedure: IRRIGATION AND DEBRIDEMENT EXTREMITY LEFT HAND;  Surgeon: Jodi Marbleavid A Thompson, MD;  [facility];  Service: Orthopedics;  Laterality: Left;  . Hernia repair      bilat ing age 18months  . Tendon repair Left 12/23/2012    Procedure: DIVISION AND INSETTING FLAP TO LEFT RING FINGER ADJACENT TISSUE REARRANGEMENT LEFT INDEX FINGER AMPUTATION SITE;  Surgeon: Jodi Marbleavid A Thompson, MD;  Location: [facility];  Service: Orthopedics;  Laterality: Left;  . Abdominal aortagram N/A 08/13/2014    Procedure: ABDOMINAL Ronny FlurryAORTAGRAM;  Surgeon: Fransisco HertzBrian L Chen, MD;  [facility];  Service: Cardiovascular;  Laterality: N/A;  . Amputation Left 08/15/2014    Procedure: AMPUTATION LEFT RING FINGER;  Surgeon: Kathryne Hitchhristopher Y Blackman, MD;  Location: [facility] OR;  Service: Orthopedics;  Laterality: Left;  . Amputation Right 08/15/2014    Procedure: AMPUTATION RIGHT FOOT FIRST RAY AMPUTATION AND IRRIGATION AND DEBREDMENT OF RIGHT FOOT ABSCESS;  Surgeon: Kathryne Hitchhristopher Y Blackman, MD;  Location: [facility] OR;  Service: Orthopedics;  Laterality: Right;   Social History: Lives alone.  His father checks in on him.    No family history on file.  reports that he has never smoked. He has never used smokeless tobacco. He reports that he drinks about 4.8 oz of alcohol per week. He reports that he does not use illicit drugs.   Diet:  Regular with No Added Sugar.  Supplemental shakes:  no  Review of Systems  Patient has ability to communicate answers to ROS: yes See HPI   Constipation yes ,   Incontinence no  Dizziness no   Syncope no   Skin problems S/P amputations   Visual Impairment no   Hearing impairment no  Eating impairment no  Impaired Memory or Cognition Patient denies   Behavioral problems Patient has been resistant to assistance with managing his diabetes as outpatient.  He has a mistrust of healthcare providers.    Sleep problems no   Weight loss no    Pain:  Pain  Location: Right foot amputation site and left hand amputation site. Pain Rating: He is currently in no pain.  Pain Duration: days Pain Therapies: Percocet Pain Response to Therapies: Location: Finger amputation site; Severity: 5/10 when aching, occasional sharp pain and burning / tickling sensation in Right foot and left hand Bowel Movement Difficulty: yes   PHYSICAL EXAM:. Wt Readings from Last 3 Encounters:  08/11/14 181 lb 4.8 oz (82.237 kg)  04/02/14 199 lb 9.6 oz (90.538 kg)  12/03/13 212 lb 11.2 oz (96.48 kg)   Temp Readings from Last 3 Encounters:  08/17/14 98.2 F (36.8 C) Oral  04/02/14 98.7 F (37.1 C) Oral  12/03/13 98.3 F (36.8 C) Oral   BP Readings from Last 3 Encounters:  08/17/14 117/65  04/02/14 110/70  12/03/13 167/98   Pulse Readings from Last 3 Encounters:  08/17/14 95  04/02/14 109  12/03/13 109    General: alert, cooperative, no distress, well nourished, pleasant, clean, groomed HEENT:  No scleral icterus, no nasal secretions, [place]romucosa moist and no erythema or lesion Neck:  Supple, No JVD, no lymphadenopathy  CV:  RRR, II/VI SEM RESP: No resp distress or accessory muscle use.  Clear to ausc bilat. No wheezing, no rales, no rhonchi.  ABD:  Soft, Non-tender, non-distended, +bowel sounds, no masses MSK:  Mild lumbar back pain, no joint pain.  No joint swelling or redness EXT: Right foot wrapped. Left hand 4th digit stump wrapped.  Dressings dry and intact.   Good cap refill on right foot's  lateral exposed toes   no edema, amputation present Gait:  Sitting in chair Psych:  Orientation: apears oriented to person, place, and general circumstances;  Insight Fair, Attention Normal;  Mood appropriate; Speech Word searching with word substitutions frequent; Language dysphasic thought content appears to be approaching a meaning relevant to the context        Assessment and Plan:   See Problem List for individual problem's assessment and plans.       Advanced Directives (MOST form, Living Will, HCPOA): unknown Code Status:    Full code  Emergency contact:  Reyn Faivre (Father) [phone] (H); [phone]   Follow Up:  Next 30 days unless acute issues arise.

## 2014-08-19 ENCOUNTER — Other Ambulatory Visit: Payer: Self-pay | Admitting: Family Medicine

## 2014-08-19 MED ORDER — INSULIN ASPART 100 UNIT/ML ~~LOC~~ SOLN: SUBCUTANEOUS | Status: DC

## 2014-08-19 MED ORDER — SACCHAROMYCES BOULARDII 250 MG PO CAPS: 250.0000 mg | ORAL_CAPSULE | Freq: Two times a day (BID) | ORAL | Status: DC

## 2014-08-26 ENCOUNTER — Encounter: Payer: Self-pay | Admitting: Family Medicine

## 2014-08-26 ENCOUNTER — Non-Acute Institutional Stay: Payer: Medicare Other | Admitting: Family Medicine

## 2014-08-26 DIAGNOSIS — R748 Abnormal levels of other serum enzymes: Secondary | ICD-10-CM

## 2014-08-26 DIAGNOSIS — E118 Type 2 diabetes mellitus with unspecified complications: Secondary | ICD-10-CM | POA: Diagnosis not present

## 2014-08-26 DIAGNOSIS — Z8739 Personal history of other diseases of the musculoskeletal system and connective tissue: Secondary | ICD-10-CM | POA: Diagnosis not present

## 2014-08-26 DIAGNOSIS — I6990 Unspecified sequelae of unspecified cerebrovascular disease: Secondary | ICD-10-CM

## 2014-08-26 DIAGNOSIS — E8809 Other disorders of plasma-protein metabolism, not elsewhere classified: Secondary | ICD-10-CM | POA: Diagnosis not present

## 2014-08-26 DIAGNOSIS — R7989 Other specified abnormal findings of blood chemistry: Secondary | ICD-10-CM

## 2014-08-26 DIAGNOSIS — E1165 Type 2 diabetes mellitus with hyperglycemia: Secondary | ICD-10-CM

## 2014-08-26 DIAGNOSIS — IMO0002 Reserved for concepts with insufficient information to code with codable children: Secondary | ICD-10-CM

## 2014-08-26 LAB — BASIC METABOLIC PANEL
BUN: 14 mg/dL (ref 6–23)
CALCIUM: 8.3 mg/dL — AB (ref 8.4–10.5)
CHLORIDE: 108 meq/L (ref 96–112)
CO2: 26 mEq/L (ref 19–32)
Creatinine: 1.1 mg/dL (ref 0.5–1.4)
GLUCOSE: 233 mg/dL — AB (ref 70–99)
Potassium: 4.3 mEq/L (ref 3.5–5.3)
Sodium: 143 mEq/L (ref 135–145)

## 2014-08-26 LAB — HEPATIC FUNCTION PANEL
ALK PHOS: 102 U/L (ref 39–117)
ALT: 13 U/L (ref 0–53)
AST: 11 U/L (ref 0–37)
Albumin: 2.6 g/dL — AB (ref 3.5–5.2)
BILIRUBIN, TOTAL: 0.3 mg/dL (ref 0.2–1.2)
Total Protein: 5.5 g/dL — AB (ref 6.0–8.3)

## 2014-08-26 LAB — HEMOGLOBIN A1C: A1C: 11.5 — AB (ref ?–5.7)

## 2014-08-26 LAB — CBC WITH DIFFERENTIAL
BASOS ABS: 0 10*3/uL (ref 0–0)
Basophils Relative: 1 % (ref 0–1)
Eosinophil percent: 6 % — AB (ref 0–5)
Eosinophils Absolute: 0 10*3/uL (ref 0–1)
GRANULOCYTE COUNT ABSOLUTE: 3.1 10*3/uL (ref 1.7–7.7)
GRANULOCYTE PERCENT: 51 % (ref 43–77)
HEMATOCRIT (KUC): 33.4 % — AB (ref 39.0–52.0)
Hemoglobin: 10.8 g/dL — AB (ref 13–17)
Lymphocytes absolute: 2.1 10*3/uL (ref 0.7–4.0)
Lymphocytes relative %: 35 % (ref 12.0–46.0)
MCH: 28.9 pg (ref 26.0–34.0)
MCHC: 32.3 g/dL (ref 30.0–36.0)
MCV: 89.3 fL (ref 78.0–100.0)
MONOCYTES RELATIVE % (KUC): 7 % (ref 3.0–12.0)
MPV: 10.2 fL (ref 8.6–12.4)
Monocyes absolute: 0.4 10*3/uL (ref 0.1–1)
PLATELET COUNT (KUC): 298 10*3/uL (ref 150–400)
RBC: 3.74 MIL/uL — AB (ref 4.22–5.81)
RDW: 13.4 % (ref 11.5–15.5)
WBC (KUC): 6.1 10*3/uL (ref 4.0–10.5)

## 2014-08-26 LAB — VITAMIN B12: Vitamin B-12: 808 pg/mL (ref 211–911)

## 2014-08-26 LAB — LIPID PANEL
Cholesterol: 114 mg/dL (ref 0–200)
HDL: 30 mg/dL — AB (ref 39–?)
LDL CALC: 55 mg/dL (ref 0–99)
TRIGLYCERIDES: 144 mg/dL (ref ?–150)
Total CHOL/HDL Ratio: 3.8 Ratio
VLDL Cholesterol Cal: 29 mg/dL (ref 0–40)

## 2014-08-26 MED ORDER — INSULIN ASPART 100 UNIT/ML ~~LOC~~ SOLN: 6.0000 [IU] | Freq: Three times a day (TID) | SUBCUTANEOUS | Status: DC

## 2014-08-26 MED ORDER — ASPIRIN 81 MG PO TABS: 81.0000 mg | ORAL_TABLET | Freq: Every day | ORAL | Status: DC

## 2014-08-26 MED ORDER — SULFAMETHOXAZOLE-TRIMETHOPRIM 800-160 MG PO TABS: 1.0000 | ORAL_TABLET | Freq: Two times a day (BID) | ORAL | Status: DC

## 2014-08-26 NOTE — Progress Notes (Signed)
Patient ID: Stephen SprinklesJeffrey W Fitzgerald, male   DOB: [birth year], 54 y.o.   MRN: [id number] S: [facility] visited at [facility] with Dr. McDiarmid for f/u of recent right great toe and left ring finger amputations, as well as to review diabetes care. Overall pt reports he is doing "fine" and has minimal pain. He saw Dr. Magnus IvanBlackman earlier this week and had his bandages removed, going to daily dressing changes and heel weight bearing on his right foot. Dr. Magnus IvanBlackman also started a course of Bactrim. He feels "okay," otherwise, though he dislikes getting his blood sugar checks and getting insulin injections (see previous progress notes; pt remains convinced he does not have diabetes and there is some distrust of physicians playing a role in chronic non-adherence to drug regimens). He denies N/V, fever, abdominal pain, chest pain, trouble breathing, or coryza-type symptoms.  ROS: As above.  O: VS reviewed. No fevers and BP within normal limits. Gen: non-toxic-appearing adult male in NAD HEENT: [place]/AT, EOMI, MMM Pulm: normal WOB Abd: soft, nontender Ext: no LE edema noted  Left hand: clean, dry dressing in place initially; s/p distal 4th finger amputation, sutures in place, no bleeding / drainage / dehiscence  Right foot: clean, dry dressing in place initially; s/p 1st ray amputation, sutures in place, mild surrounding redness but no expressible drainage or bleeding Skin: wounds as above; otherwise warm, no rashes   A: 54yo male with very poorly controlled DM type 2 on metformin and insulin, s/p recent amputations for osteomyelitis, doing well. - possible cellulitic-type infection around foot wound (?indication for Bactrim from orthopedics) but no frank bleeding or drainage - DM control remains an issue, with CBG's ranging widely throughout the day from 160's to 400's - recent labs reviewed - A1c >11, Hb stable, Cr mildly elevated but still in normal range (0.65 to 1.11), albumin markedly low (2.6) - mild expressive aphasia  secondary to CVA remains but pt able to communicate well, today - fixed assertion that he does not have diabetes remains; no definite history of psychiatric issues, does appear to have capacity to make his own decisions - pt DECLINED Prevnar immunization; states he "had a super-flu shot in 1999" and doesn't want or need anything else  P:  - ADD Novolog 6 units scheduled at mealtimes - continue Lantus 20 mg daily and Novolog sliding scale with CBG checks at mealtimes - continue metformin (considered increasing to 1000 mg BID but pt already on 1000 mg of XR formation daily) - continue atorvastatin 40 mg daily; consider increase to 80 mg daily - reduce ASA to 81 mg - consider addition of ACE inhibitor for renal protection but may need to recheck BMP first (?next week) - continue Bactrim until completion per orthopedics recommendations - pt to f/u with orthopedics on 2/17 with Dr. Magnus IvanBlackman for suture removal - will re-evaluate on formal rounds weekly and see acutely PRN - anticipate discharge home later this month  Will attempt to discuss overall health and recent history with pt's father; may need to consider psych evaluation.  Dr. McDiarmid left message with pt's father 2/10.  Bobbye Mortonhristopher M Street, MD PGY-3, [facility] 08/26/2014, 2:18 PM

## 2014-08-27 NOTE — Progress Notes (Signed)
Patient ID: Stephen Fitzgerald, male   DOB: Jul 11, 1961, 54 y.o.   MRN: [id number] I have seen and examined this patient with Dr Casper HarrisonStreet. I have discussed with him.  I agree with their findings and plans as documented in their visit note.  Patient previously documented as having a needle aversion that prevented prior attempt to start insulin as outpatient.   He was on Glipizide 5 mg TWICE DAILY at [facility] 12/03/13, but not at 04/02/14 office visit when he was just on metformin XR 1000 mg daily.  I anticipate that Stephen Fitzgerald will not adhere to an injection therapy for his uncontrolled diabetes at discharge.  Would like to maximize metformin dose by discharge from SNF.

## 2014-08-31 ENCOUNTER — Ambulatory Visit: Payer: Medicare Other | Admitting: Family Medicine

## 2014-09-03 ENCOUNTER — Other Ambulatory Visit: Payer: Self-pay | Admitting: Family Medicine

## 2014-09-03 MED ORDER — METFORMIN HCL ER 500 MG PO TB24: 1000.0000 mg | ORAL_TABLET | Freq: Two times a day (BID) | ORAL | Status: DC

## 2014-09-03 MED ORDER — GLIPIZIDE ER 10 MG PO TB24: 10.0000 mg | ORAL_TABLET | Freq: Every day | ORAL | Status: DC

## 2014-09-09 ENCOUNTER — Non-Acute Institutional Stay: Payer: Medicare Other | Admitting: Family Medicine

## 2014-09-09 DIAGNOSIS — IMO0002 Reserved for concepts with insufficient information to code with codable children: Secondary | ICD-10-CM

## 2014-09-09 DIAGNOSIS — E1165 Type 2 diabetes mellitus with hyperglycemia: Secondary | ICD-10-CM

## 2014-09-09 DIAGNOSIS — E118 Type 2 diabetes mellitus with unspecified complications: Secondary | ICD-10-CM | POA: Diagnosis not present

## 2014-09-09 DIAGNOSIS — Z8739 Personal history of other diseases of the musculoskeletal system and connective tissue: Secondary | ICD-10-CM | POA: Diagnosis not present

## 2014-09-09 NOTE — Assessment & Plan Note (Addendum)
CBGs much improved from previously. Only required 22 u SSI over the past 4 days, though it appears that for an unknown reason his scheduled meal time novolog was added back last week. Has had sugars of 100 and 90 recently. Will d/c scheduled novolog. Will increase metformin to 2 g daily. Glipizide to 5 mg BID. Continue to follow sugars.

## 2014-09-09 NOTE — Progress Notes (Signed)
Patient ID: Stephen SprinklesJeffrey W Fitzgerald, male   DOB: 14-Mar-1961, 54 y.o.   MRN: [id number]  Stephen AlarEric Berton Butrick, MD Phone: [phone]  Stephen Fitzgerald is a 54 y.o. male who was seen today for nursing home visit.  DM: patient continues to believe that he does not have DM. When discussing this with him he talks in circles and does not seem to grasp that his DM contributed to his amputation. He does note today that he may be willing to take PO medications so that he would not have to be stuck at office visits for blood draws. His most recent sugars have been improved with low of 86 and high of 376.  S/p amputation right great toe and left 4th finger: doing well with regards to these areas. Saw ortho and they removed the stitches. Advised to continue soaks. Patient with no complaints of pain to the areas. Notes he does have some dry flaking skin on the back of his right heal. Feels that his wounds are healing well. Of note while in the hospital patient had angiography of right leg on 1/28 showed intact blood flow to the right foot. ABIs of right leg falsely elevated secondary to calcified vessels.    ROS: Per HPI   Physical Exam Filed Vitals:   09/10/14 1533  BP: 145/74  Pulse: 78  Temp: 98 F (36.7 C)  Resp: 18    Gen: Well NAD Exts: Non edematous BL  LE, extremities cool to touch, no pallor Wound on left 4th finger c/d/i Wound on right foot c/d/i - healing well, no surrounding erythema   Assessment/Plan: Please see individual problem list.  Stephen AlarEric Jerimah Witucki, MD [facility] PGY-3

## 2014-09-09 NOTE — Assessment & Plan Note (Signed)
Wounds healing well. Will continue soaks per ortho. Monitor for development of pain.

## 2014-09-10 MED ORDER — METFORMIN HCL ER 500 MG PO TB24: 2000.0000 mg | ORAL_TABLET | Freq: Every day | ORAL | Status: DC

## 2014-09-10 MED ORDER — GLIPIZIDE 5 MG PO TABS: 5.0000 mg | ORAL_TABLET | Freq: Two times a day (BID) | ORAL | Status: DC

## 2014-09-11 NOTE — Progress Notes (Signed)
FMTS Attending Note  I personally saw and evaluated the patient. The plan of care was discussed with the resident team. I agree with the assessment and plan as documented by the resident.   Loanne Emery MD 

## 2014-09-15 ENCOUNTER — Encounter: Payer: Self-pay | Admitting: Family Medicine

## 2014-09-22 ENCOUNTER — Encounter: Payer: Self-pay | Admitting: Family Medicine

## 2014-09-22 ENCOUNTER — Non-Acute Institutional Stay: Payer: Medicare Other | Admitting: Family Medicine

## 2014-09-22 DIAGNOSIS — IMO0002 Reserved for concepts with insufficient information to code with codable children: Secondary | ICD-10-CM

## 2014-09-22 DIAGNOSIS — S68119D Complete traumatic metacarpophalangeal amputation of unspecified finger, subsequent encounter: Secondary | ICD-10-CM

## 2014-09-22 DIAGNOSIS — E785 Hyperlipidemia, unspecified: Secondary | ICD-10-CM | POA: Diagnosis not present

## 2014-09-22 DIAGNOSIS — R4701 Aphasia: Secondary | ICD-10-CM

## 2014-09-22 DIAGNOSIS — E1165 Type 2 diabetes mellitus with hyperglycemia: Secondary | ICD-10-CM

## 2014-09-22 DIAGNOSIS — Z89411 Acquired absence of right great toe: Secondary | ICD-10-CM

## 2014-09-22 DIAGNOSIS — E118 Type 2 diabetes mellitus with unspecified complications: Secondary | ICD-10-CM | POA: Diagnosis not present

## 2014-09-22 NOTE — Progress Notes (Signed)
Patient ID: Stephen Fitzgerald, male   DOB: 03-17-1961, 54 y.o.   MRN: [id number] [facility]  Visit I have seen and examined this patient with Dr Birdie Sons. I have discussed with him the discharge plans.  I agree with their findings and plans as documented in their discharge note.    Primary Care Provider: Levert Feinstein, MD Location of Care: [facility] Visit Information: a scheduled routine follow-up visit and Discharge Visit Patient accompanied by patient Source(s) of information for visit: patient, nursing home and past medical records  Chief Complaint:  Chief Complaint  Patient presents with  . OTHER    Regulatory Visit  . Discharge visit    Nursing Concerns: None Nutrition Concerns: None Wound Care Nurse Concerns: Dr Rayburn Ma (Ortho) saw the patient in his office 09/21/14.  His brief written consult note indicated that patient may get right foot wet.  The left hand finger amputation site needs no further dressing. PT / OT Concerns: Patient has reached his PT/OT goals.   HISTORY OF PRESENT ILLNESS:  Problem List Items Addressed This Visit      Unprioritized   Expressive aphasia syndrome - Chronic Condition - Pt able to express self but word substitution is common. - Perseveration on topic to anger at [employer], former employer - No plans to hurt others or himself   Diabetes mellitus type 2 with complications, uncontrolled - Chronic problem - 0800 CBGs: 67-236; SSI 8 units / 7 days   1200 CBGs: 116-254; SSI 14 units / 7 days   1600 CBGs: 74-178; SSI 6 units / 7 days - Taking Metformin XR 500 mg tab, 4 tab every morning and Glipizide 5 mg twice a day. - Denies experience of hypoglycemic symptoms - Stephen Fitzgerald says he is willing to continue taking metformin and glipizide once he is at home, but he is not willing to have CBGs checked or venupuncture for labs at office.  He has a belief that once he is on the antidiabetic  medications, he no longer needs his glycemic control monitored.      S/P AMPUTATION LEFT RING FINGER;    -Surgeon: [name], MD on 08/15/14             - Dr Rayburn Ma Outpatient surgical recheck on 09/21/14 showed finger amputation surgical site well-healing with no need for further topical care or dressing.             - Patient feels like he has good enough grip with right hand to return to driving             - Patient has not needed Percocet in last week S/P AMPUTATION RIGHT FOOT FIRST RAY AMPUTATION AND IRRIGATION AND DEBREDMENT OF   RIGHT FOOT ABSCESS;    - Surgeon: [name], MD on 08/15/14               -  Dr Rayburn Ma recommended after outpatient surgical recheck on 09/21/14 that Stephen Fitzgerald may bear weight as tolerated while wearing a post-op shoe on the right foot. He should soak the foot daily in Dial soap and wear a sock.  He no longer needs a dressing on the foot.  Dr Rayburn Ma will see Stephen Fitzgerald back in his office in one month for recheck.             - Patient has not needed Percocet in the last week.   Hyperlipidemia - Chronic problem - Taking Atorvastatin 40  mg daily.  No jaundice.  No RUQ pain.   - No chest pain, no claudication, no new limb or facial weakness or numbness           Outpatient Encounter Prescriptions as of 09/22/2014  Medication Sig  . aspirin 81 MG tablet Take 1 tablet (81 mg total) by mouth daily.  Marland Kitchen. atorvastatin (LIPITOR) 40 MG tablet TAKE 1 TABLET BY MOUTH DAILY  . glipiZIDE (GLUCOTROL) 5 MG tablet Take 1 tablet (5 mg total) by mouth 2 (two) times daily before a meal.  . insulin aspart (NOVOLOG) 100 UNIT/ML injection Inject TID according to sliding scale based off of CBG check: <70: implement hypoglycemia protocol 71-150: 0 units 151-200: 2 units 201-250: 4 units 251-300: 6 units 301-350: 8 units >350: 10 units  . metFORMIN (GLUCOPHAGE-XR) 500 MG 24 hr tablet Take 4 tablets (2,000 mg total) by mouth daily with breakfast.   . metoprolol succinate (TOPROL-XL) 50 MG 24 hr tablet TAKE 1 TABLET BY MOUTH DAILY  . vitamin B-12 (CYANOCOBALAMIN) 1000 MCG tablet Take 1,000 mcg by mouth daily.  . [DISCONTINUED] saccharomyces boulardii (FLORASTOR) 250 MG capsule Take 1 capsule (250 mg total) by mouth 2 (two) times daily.  . [DISCONTINUED] sulfamethoxazole-trimethoprim (BACTRIM DS,SEPTRA DS) 800-160 MG per tablet Take 1 tablet by mouth 2 (two) times daily.  Marland Kitchen. oxyCODONE-acetaminophen (PERCOCET/ROXICET) 5-325 MG per tablet Take 1-2 tablets by mouth every 4 (four) hours as needed for moderate pain. (Patient not taking: Reported on 09/23/2014)   Allergies  Allergen Reactions  . Unasyn [Ampicillin-Sulbactam Sodium] Rash   History Patient Active Problem List   Diagnosis Date Noted  . History of osteomyelitis 08/26/2014  . Serum albumin decreased 08/26/2014  . Vitamin B12 deficiency 08/18/2014  . Heart murmur, systolic   . Heart murmur 07/24/2013  . Hyperlipidemia 03/06/2013  . Hypertension 03/06/2013  . Tachycardia 01/13/2013  . Diabetes mellitus type 2 with complications, uncontrolled 12/08/2012  . Expressive aphasia syndrome 12/08/2012  . 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 Diabetes mellitus without complication   . Stroke     expressive aphasia  . Hypertension   . Flexor tenosynovitis of finger 12/08/2012  . Blood poisoning   . Bacteremia   . Abscess of hand, left 12/08/2012   Past Surgical History  Procedure Laterality Date  . No past surgeries    . I&d extremity Left 12/04/2012    Procedure: IRRIGATION AND DEBRIDEMENT Left Hand with Ring Removal  times three, Carpal Tunnel , Flexor tendon Synovectomy;  Surgeon: Jodi Marbleavid A Thompson, MD;  [facility];  Service: Orthopedics;  Laterality: Left;  . Amputation Left 12/06/2012    Procedure: AMPUTATION DIGIT LEFT INDEX;  Surgeon: Jodi Marbleavid A Thompson, MD;  [facility];  Service: Orthopedics;  Laterality: Left;   . I&d extremity Left 12/06/2012    Procedure: IRRIGATION AND DEBRIDEMENT EXTREMITY LEFT HAND;  Surgeon: Jodi Marbleavid A Thompson, MD;  [facility];  Service: Orthopedics;  Laterality: Left;  . Hernia repair      bilat ing age 18months  . Tendon repair Left 12/23/2012    Procedure: DIVISION AND INSETTING FLAP TO LEFT RING FINGER ADJACENT TISSUE REARRANGEMENT LEFT INDEX FINGER AMPUTATION SITE;  Surgeon: Jodi Marbleavid A Thompson, MD;  Location: [facility];  Service: Orthopedics;  Laterality: Left;  . Abdominal aortagram N/A 08/13/2014    Procedure: ABDOMINAL Ronny FlurryAORTAGRAM;  Surgeon: Fransisco HertzBrian L Chen, MD;  [facility];  Service: Cardiovascular;  Laterality: N/A;  . Amputation Left 08/15/2014    Procedure: AMPUTATION LEFT RING FINGER;  Surgeon: [name], MD;  Location: [facility] OR;  Service: Orthopedics;  Laterality: Left;  . Amputation Right 08/15/2014    Procedure: AMPUTATION RIGHT FOOT FIRST RAY AMPUTATION AND IRRIGATION AND DEBREDMENT OF RIGHT FOOT ABSCESS;  Surgeon: [name], MD;  Location: [facility] OR;  Service: Orthopedics;  Laterality: Right;   No family history on file.  reports that he has never smoked. He has never used smokeless tobacco. He reports that he drinks about 4.8 oz of alcohol per week. He reports that he does not use illicit drugs.  Basic Activities of Daily Living   ADLs Independent Needs Assistance Dependent  Bathing x    Dressing x    Ambulation x    Toileting x    Eating x       Falls in the past six months:   no  Diet:  diabetic, No added Sugar calorie Supplemental shakes:  Yes, Med Pass 120 ml daily  Review of Systems  Patient has ability to communicate answers to ROS: yes See HPI  Geriatric Syndromes: Constipation no ,   Incontinence no  Dizziness no   Syncope no   Skin problems no   Visual Impairment no   Hearing impairment no  Eating impairment no    General: Denies fevers,  weight loss, fatigue Eyes: Denies blurred vision   Cardiovascular: Denies chest pains, , dyspnea on exertion, orthopnea, peripheral edema.  Respiratory: Denies cough, sputum, dyspnea.  Gastrointestinal: Denies abd pain, bloating, constipation, diarrhea.  Genitourinary: Denies dysuria, urinary frequency Musculoskeletal: Denies joint pain, swelling, weakness.  Skin: Denies skin rash or ulcers. Neurologic: Denies transient paralysis, weakness, paresthesias, headache.    PHYSICAL EXAM:. Wt Readings from Last 3 Encounters:  09/22/14 181 lb 3.2 oz (82.192 kg)  08/11/14 181 lb 4.8 oz (82.237 kg)  04/02/14 199 lb 9.6 oz (90.538 kg)   Temp Readings from Last 3 Encounters:  09/22/14 97 F (36.1 C)   09/10/14 98 F (36.7 C)   08/17/14 98.2 F (36.8 C) Oral   BP Readings from Last 3 Encounters:  09/22/14 146/68  09/10/14 145/74  08/17/14 117/65   Pulse Readings from Last 3 Encounters:  09/22/14 98  09/10/14 78  08/17/14 95   Physical Exam on 09/22/14 General: alert, cooperative, well nourished, pleasant, clean, groomed o HEENT:  No scleral icterus, CV:  RRR, 2/6 SEMmurmur, no ankle swelling RESP: No resp distress or accessory muscle use.  Clear to ausc bilat. No wheezing, no rales, no rhonchi.  ABD:  Soft, Non-tender, non-distended, +bowel sounds, no masses Ext: Left right finger amputation surgical site with well healing scar. Right foot first ray ampution surgical scar well healing without evidence of dehiscence or infection.  Psych:  Good eye contact, speech clear, language perserverating and intention appears to be to address topic of conversation with limited success due to word substitution. Attentive to examiner throughout exam. Mostly euthymic with occasional profanity when address topics that appear to disturb him.    Assessment and Plan:   See Problem List for individual problem's assessment and plans.    Advanced Directives (MOST form, Living Will, HCPOA): Full Code. Patient's father is primary caretaker.

## 2014-09-23 ENCOUNTER — Other Ambulatory Visit: Payer: Self-pay | Admitting: Family Medicine

## 2014-09-23 ENCOUNTER — Non-Acute Institutional Stay (INDEPENDENT_AMBULATORY_CARE_PROVIDER_SITE_OTHER): Payer: Medicare Other | Admitting: Family Medicine

## 2014-09-23 DIAGNOSIS — S68119D Complete traumatic metacarpophalangeal amputation of unspecified finger, subsequent encounter: Secondary | ICD-10-CM

## 2014-09-23 DIAGNOSIS — E118 Type 2 diabetes mellitus with unspecified complications: Secondary | ICD-10-CM

## 2014-09-23 DIAGNOSIS — S68119A Complete traumatic metacarpophalangeal amputation of unspecified finger, initial encounter: Secondary | ICD-10-CM | POA: Insufficient documentation

## 2014-09-23 DIAGNOSIS — Z89411 Acquired absence of right great toe: Secondary | ICD-10-CM

## 2014-09-23 DIAGNOSIS — E785 Hyperlipidemia, unspecified: Secondary | ICD-10-CM

## 2014-09-23 DIAGNOSIS — E1165 Type 2 diabetes mellitus with hyperglycemia: Secondary | ICD-10-CM

## 2014-09-23 DIAGNOSIS — IMO0002 Reserved for concepts with insufficient information to code with codable children: Secondary | ICD-10-CM

## 2014-09-23 HISTORY — DX: Complete traumatic metacarpophalangeal amputation of unspecified finger, initial encounter: S68.119A

## 2014-09-23 MED ORDER — ATORVASTATIN CALCIUM 40 MG PO TABS: ORAL_TABLET | ORAL | Status: DC

## 2014-09-23 MED ORDER — GLIPIZIDE 5 MG PO TABS: 5.0000 mg | ORAL_TABLET | Freq: Two times a day (BID) | ORAL | Status: DC

## 2014-09-23 MED ORDER — METOPROLOL SUCCINATE ER 50 MG PO TB24: 50.0000 mg | ORAL_TABLET | Freq: Every day | ORAL | Status: DC

## 2014-09-23 MED ORDER — ASPIRIN 81 MG PO TABS: 81.0000 mg | ORAL_TABLET | Freq: Every day | ORAL | Status: DC

## 2014-09-23 MED ORDER — METFORMIN HCL 500 MG PO TABS: 1000.0000 mg | ORAL_TABLET | Freq: Two times a day (BID) | ORAL | Status: DC

## 2014-09-23 NOTE — Assessment & Plan Note (Signed)
Tolerating atorvastatin 40 mg daily Continue atorvastatin 40 mg daily at discharge.

## 2014-09-23 NOTE — Assessment & Plan Note (Signed)
Improved glycemia, but not controlled. Given patient's refusal to use insulin at home, patient will be discharges with Metformin 500 mg tablets, two tablets twice a day and with Glipizide 5 mg twice a day.  [facility] 2 to 4 weeks with PCP

## 2014-09-23 NOTE — Progress Notes (Signed)
Patient ID: Stephen Fitzgerald, male   DOB: 06/05/1961, 54 y.o.   MRN: [id number] [facility] Nursing Home Discharge Summary  Patient name: Stephen Fitzgerald Medical record number: [id number] Date of birth: 06/05/1961 Age: 54 y.o. Gender: male Date of Admission: 08/17/14  Date of Discharge: 09/23/14  Brief Nursing home course: Patient admitted to the nursing home for rehab stay following amputation of right great toe and left 4th finger. Patient participated well with PT and OT and was discharged from their care at the SNF. Patient followed up with Dr Magnus IvanBlackman (ortho) during his SNF stay and his wounds progressed nicely. His last follow up was 09/21/14 and it was recommended that his right foot be weight bearing as tolerated with post op shoe, he is able to get his foot wet in the shower and is to soak his right foot in dial soaps daily, may wear sock on right foot with no dressing needed, left middle finger healed. He was initially started on insulin regimen, though considering the patients refusal to inject himself after discharge he was transitioned to an all oral diabetic regimen. He was discharged on Metformin 1000 mg BID and glipizide 5 mg BID. He tolerated these medications well and stated prior to discharge that he would be willing to take PO medications if he did not have to get blood draws. Patients right foot and left hand wounds c/d/i on day of discharge and well healing, no surrounding erythema or drainage. Patient stable for discharge from SNF this afternoon.   Issues for Follow Up:  1. F/u Dr Magnus IvanBlackman around 10/22/14 2. Consider titration of DM medications - consider A1c if patient will let you check - he would prefer not to have this checked 3. Consider addition of ACEI for HTN/renal protection 4. F/u medication compliance   Discharge Medications:    Medication List       This list is accurate as of: 09/23/14 11:53 AM.  Always use your most recent med list.               aspirin 81 MG tablet  Take 1 tablet (81 mg total) by mouth daily.     atorvastatin 40 MG tablet  Commonly known as:  LIPITOR  TAKE 1 TABLET BY MOUTH DAILY     glipiZIDE 5 MG tablet  Commonly known as:  GLUCOTROL  Take 1 tablet (5 mg total) by mouth 2 (two) times daily before a meal.     metFORMIN 500 MG tablet  Commonly known as:  GLUCOPHAGE  Take 2 tablets (1,000 mg total) by mouth 2 (two) times daily with a meal.     metoprolol succinate 50 MG 24 hr tablet  Commonly known as:  TOPROL-XL  Take 1 tablet (50 mg total) by mouth daily. Take with or immediately following a meal.     vitamin B-12 1000 MCG tablet  Commonly known as:  CYANOCOBALAMIN  Take 1,000 mcg by mouth daily.        Follow-Up Appointments: Will follow-up with Dr Pollie MeyerMcIntyre 09/30/14 2 pm.  Glori LuisEric G Curlie Sittner, MD 09/23/2014, 11:40 AM PGY-3, [facility]

## 2014-09-23 NOTE — Assessment & Plan Note (Signed)
Improved > one month after amputation for osteomyelitis. Patient allowed full weight bearing while wearing post-op shoe May get foot  wet in shower. Soak right foot in Dial soap daily No dressing other than wearing sock. F/U with Dr Rayburn MaBlackmon in one month.

## 2014-09-23 NOTE — Progress Notes (Signed)
Patient ID: Stephen SprinklesJeffrey W Fitzgerald, male   DOB: March 07, 1961, 54 y.o.   MRN: [id number] I have seen and examined this patient. I have discussed with Dr Birdie SonsSonnenberg.  I agree with their findings and plans as documented in their discharge note. See my discharge note.

## 2014-09-23 NOTE — Assessment & Plan Note (Signed)
Over one month post-op Well healing amputation surgical site No further dressing needs F/U Dr Rayburn MaBlackmon in a month.

## 2014-09-30 ENCOUNTER — Encounter: Payer: Self-pay | Admitting: Family Medicine

## 2014-09-30 ENCOUNTER — Ambulatory Visit (INDEPENDENT_AMBULATORY_CARE_PROVIDER_SITE_OTHER): Payer: Medicare Other | Admitting: Family Medicine

## 2014-09-30 VITALS — BP 124/60 | HR 106 | Temp 98.9°F | Ht 71.0 in | Wt 191.1 lb

## 2014-09-30 DIAGNOSIS — E1165 Type 2 diabetes mellitus with hyperglycemia: Secondary | ICD-10-CM

## 2014-09-30 DIAGNOSIS — E118 Type 2 diabetes mellitus with unspecified complications: Secondary | ICD-10-CM

## 2014-09-30 DIAGNOSIS — R4701 Aphasia: Secondary | ICD-10-CM

## 2014-09-30 DIAGNOSIS — I10 Essential (primary) hypertension: Secondary | ICD-10-CM | POA: Diagnosis not present

## 2014-09-30 DIAGNOSIS — IMO0002 Reserved for concepts with insufficient information to code with codable children: Secondary | ICD-10-CM

## 2014-09-30 MED ORDER — METOPROLOL SUCCINATE ER 50 MG PO TB24: 50.0000 mg | ORAL_TABLET | Freq: Every day | ORAL | Status: DC

## 2014-09-30 MED ORDER — METFORMIN HCL ER 500 MG PO TB24: 2000.0000 mg | ORAL_TABLET | Freq: Every day | ORAL | Status: DC

## 2014-09-30 MED ORDER — GLIPIZIDE 5 MG PO TABS: 5.0000 mg | ORAL_TABLET | Freq: Two times a day (BID) | ORAL | Status: DC

## 2014-09-30 MED ORDER — ATORVASTATIN CALCIUM 40 MG PO TABS: ORAL_TABLET | ORAL | Status: DC

## 2014-09-30 NOTE — Patient Instructions (Addendum)
It was great to see you again today!  I sent in your medicines. Follow up with me in 2 months to see how things are going, sooner if needed.  Please call Dr. Doroteo GlassmanBlackmon's office and have Tinnie GensJeffrey seen there within 3-4 days to make sure they are comfortable with the amount of drainage he's having. If the foot becomes more red, swollen, or warm, please go to the ER to have it evaluated.  Be well, Dr. Pollie MeyerMcIntyre

## 2014-09-30 NOTE — Progress Notes (Signed)
Patient ID: Stephen Fitzgerald, male   DOB: [birth year], 54 y.o.   MRN: [id number]  HPI:  Patient presents to follow-up after being discharged from [facility]. He was admitted there after going to the hospital and having a amputation of his right great toe and one of his fingers due to infection. His father accompanies him to this visit.  He currently takes glipizide 5 mg once per day and metformin XR 1000 mg daily. He had been titrated up to metformin 2000 mg daily but needs a new prescription for this. Patient states he feels well. He does not want any blood work or testing. His last A1c was one month ago and was 11.5. He denies having any chest pain or shortness of breath.  I spoke with his father outside of the room. His father reports that back in 2001 or 2002 he saw psychiatry. They deemed that his cognition and understanding of things would likely decline with time due to his stroke and aphasia. Father states that he does not think psychiatry would be of any use now, and that patient would become very frustrated if he heard the psychiatry was being mentioned.  ROS: See HPI.  PMFSH: Diabetes, hypertension, prior CVA  PHYSICAL EXAM: BP 124/60 mmHg  Pulse 106  Temp(Src) 98.9 F (37.2 C) (Oral)  Ht 5\' 11"  (1.803 m)  Wt 191 lb 1.6 oz (86.682 kg)  BMI 26.66 kg/m2 Gen: No acute distress, pleasant, cooperative, wary of being in the doctor's office HEENT: Normocephalic, atraumatic Heart: Regular rate and rhythm, 2/6 systolic murmur loudest at left upper sternal border Lungs: Clear to auscultation bilaterally, normal respiratory effort Neuro: At patient's baseline with some broken speech Ext: Right foot and postop shoe. Removal of soft reveals some mild purulent drainage coming from the incision site. There is no surrounding erythema, fluctuance, tenderness, or warmth.  ASSESSMENT/PLAN:  Hypertension Well controlled. Continue current regimen.    Diabetes mellitus type 2 with  complications, uncontrolled Patient is averse to any CBG testing or needle sticks with insulin. I will refill his metformin. Father requests that I prescribe metformin XR 500 mg pills, with instructions to take 4 pills once per day for a total of 2000mg  daily. This is cheaper with his insurance. Also refilled glipizide, increase to 5 mg twice a day. Follow-up in 2 months.   Expressive aphasia syndrome Helpful discussion with father today. I don't think getting psychiatry involved at this point would be helpful, as father reports full evaluation previously without any ability for meaningful intervention. Patient strongly dislikes going to the doctor or taking medications. He lives a relatively normal life, but obviously has limited insight into his illness.   Foot drainage This was noted on exam today. It does not seem to be acutely infected. I have asked patient and his father to schedule an appointment with Dr. Eliberto IvoryBlackman's office to have this looked at, since the drainage is purulent. We discussed reasons to seek care in the emergency room sooner.  FOLLOW UP: F/u in 2 months for DM  GrenadaBrittany J. Pollie MeyerMcIntyre, MD [facility]

## 2014-10-02 NOTE — Assessment & Plan Note (Signed)
Well-controlled.  Continue current regimen. 

## 2014-10-02 NOTE — Assessment & Plan Note (Signed)
Helpful discussion with father today. I don't think getting psychiatry involved at this point would be helpful, as father reports full evaluation previously without any ability for meaningful intervention. Patient strongly dislikes going to the doctor or taking medications. He lives a relatively normal life, but obviously has limited insight into his illness.

## 2014-10-02 NOTE — Assessment & Plan Note (Signed)
Patient is averse to any CBG testing or needle sticks with insulin. I will refill his metformin. Father requests that I prescribe metformin XR 500 mg pills, with instructions to take 4 pills once per day for a total of 2000mg  daily. This is cheaper with his insurance. Also refilled glipizide, increase to 5 mg twice a day. Follow-up in 2 months.

## 2014-12-10 ENCOUNTER — Ambulatory Visit: Payer: Medicare Other | Admitting: Family Medicine

## 2014-12-16 ENCOUNTER — Ambulatory Visit (INDEPENDENT_AMBULATORY_CARE_PROVIDER_SITE_OTHER): Payer: Medicare Other | Admitting: Family Medicine

## 2014-12-16 ENCOUNTER — Encounter: Payer: Self-pay | Admitting: Family Medicine

## 2014-12-16 VITALS — BP 174/100 | HR 119 | Temp 98.1°F | Ht 71.0 in | Wt 205.8 lb

## 2014-12-16 DIAGNOSIS — M7989 Other specified soft tissue disorders: Secondary | ICD-10-CM | POA: Diagnosis not present

## 2014-12-16 DIAGNOSIS — E1165 Type 2 diabetes mellitus with hyperglycemia: Secondary | ICD-10-CM

## 2014-12-16 DIAGNOSIS — E118 Type 2 diabetes mellitus with unspecified complications: Secondary | ICD-10-CM | POA: Diagnosis not present

## 2014-12-16 DIAGNOSIS — E119 Type 2 diabetes mellitus without complications: Secondary | ICD-10-CM | POA: Diagnosis not present

## 2014-12-16 DIAGNOSIS — I10 Essential (primary) hypertension: Secondary | ICD-10-CM

## 2014-12-16 DIAGNOSIS — IMO0002 Reserved for concepts with insufficient information to code with codable children: Secondary | ICD-10-CM

## 2014-12-16 LAB — POCT GLYCOSYLATED HEMOGLOBIN (HGB A1C): Hemoglobin A1C: 6.4

## 2014-12-16 NOTE — Progress Notes (Signed)
Patient ID: Stephen Fitzgerald, male   DOB: 1960/09/07, 54 y.o.   MRN: [id number]  HPI:  DM: currently taking metformin XR 2000mg  daily and glipizide 5mg  BID. Denies having any chest pain or shortness of breath. No complaints from patient.  Right foot swelling: Noted on examination of foot today. Patient had first ray amputation back in January for necrotic first toe. Patient's father last saw his foot about 3 weeks ago and did not noted to be as swollen as it is today. Patient adamantly denies any pain or changes to his foot. States it has been this way since his surgery.  ROS: See HPI.  PMFSH: hx prior CVA with resultant expressive aphasia, HLD, DM  PHYSICAL EXAM: BP 167/92 mmHg  Pulse 119  Temp(Src) 98.1 F (36.7 C) (Oral)  Ht 5\' 11"  (1.803 m)  Wt 205 lb 12.8 oz (93.35 kg)  BMI 28.72 kg/m2 Gen: NAD HEENT: NCAT Heart: tachycardic, 2/6 systolic murmur loudest RUSB Lungs: NWOB, speaks in full sentences without distress Neuro: grossly nonfocal, speech broken at pt's baseline Ext: Right midfoot with erythema and swelling. No tenderness to palpation or focal sores or abscesses. Status post right first ray amputation. Dorsal pedis pulse nonpalpable on right. Brisk capillary refill on right. See photos below.        ASSESSMENT/PLAN:  Foot swelling Unclear if swelling represents acute infection or chronic postoperative changes. Patient is adamant that there is no acute issue going on with his foot. I'm hesitant to trust his assessment given his history of presenting with advanced necrotic digits requiring amputation on multiple occasions in the past.   After discussion with patient's father and with attending Dr. Gwendolyn GrantWalden, we decided to observe this. Patient's father will check his foot twice a week for the next 3 weeks. He will follow-up with me in clinic in 3 weeks. I have taken pictures to document his current exam. If his foot worsens any time his father's to contact us or take  patient to the emergency room.   Diabetes mellitus type 2 with complications, uncontrolled A1c 6.4 today, excellent control. Clearly removal of infected digits has helped his glycemic control. Continue glipizide and metformin at current doses.  Cardiac: on aspirin, statin Renal: recent normal renal function. Defer microalbumin testing today (pt averse to any tests) Eye: UTD Foot: see separate foot documentation Immunizations: consider pneumovax in future but doubt pt will accept vaccination as he is averse to any medical treatments or interventions.    Hypertension BP elevated by pt stressed by all the fuss we made over his foot. Will recheck BP when he follows up in 3 weeks. Hopefully can avoid making medication changes as it is a struggle to get him tot ake even his current medications.    FOLLOW UP: F/u in 3 weeks for foot swelling  GrenadaBrittany J. Pollie MeyerMcIntyre, MD [facility]

## 2014-12-16 NOTE — Assessment & Plan Note (Signed)
Unclear if swelling represents acute infection or chronic postoperative changes. Patient is adamant that there is no acute issue going on with his foot. I'm hesitant to trust his assessment given his history of presenting with advanced necrotic digits requiring amputation on multiple occasions in the past.   After discussion with patient's father and with attending Dr. Gwendolyn GrantWalden, we decided to observe this. Patient's father will check his foot twice a week for the next 3 weeks. He will follow-up with me in clinic in 3 weeks. I have taken pictures to document his current exam. If his foot worsens any time his father's to contact us or take patient to the emergency room.

## 2014-12-16 NOTE — Assessment & Plan Note (Signed)
BP elevated by pt stressed by all the fuss we made over his foot. Will recheck BP when he follows up in 3 weeks. Hopefully can avoid making medication changes as it is a struggle to get him tot ake even his current medications.

## 2014-12-16 NOTE — Assessment & Plan Note (Signed)
A1c 6.4 today, excellent control. Clearly removal of infected digits has helped his glycemic control. Continue glipizide and metformin at current doses.  Cardiac: on aspirin, statin Renal: recent normal renal function. Defer microalbumin testing today (pt averse to any tests) Eye: UTD Foot: see separate foot documentation Immunizations: consider pneumovax in future but doubt pt will accept vaccination as he is averse to any medical treatments or interventions.

## 2014-12-16 NOTE — Patient Instructions (Signed)
A1c is great today.  Dad should look at foot twice a week until I see him back on 6/23 at 1:45pm If any worsening redness, swelling, drainage, blueness/blackness, call us immediately or go to ER.  Will recheck blood pressure at follow up visit.  Be well, Dr. Pollie MeyerMcIntyre

## 2015-01-07 ENCOUNTER — Ambulatory Visit (INDEPENDENT_AMBULATORY_CARE_PROVIDER_SITE_OTHER): Payer: Medicare Other | Admitting: Family Medicine

## 2015-01-07 VITALS — BP 177/88 | HR 117 | Temp 98.2°F | Ht 71.0 in | Wt 214.2 lb

## 2015-01-07 DIAGNOSIS — I10 Essential (primary) hypertension: Secondary | ICD-10-CM

## 2015-01-07 DIAGNOSIS — M7989 Other specified soft tissue disorders: Secondary | ICD-10-CM | POA: Diagnosis not present

## 2015-01-07 NOTE — Assessment & Plan Note (Addendum)
BP elevated today at 177/88, improving to 150/80 on recheck. Patient reports compliance with his metoprolol. I suspect much of this is whitecoat hypertension as he adamantly hates going to the doctor. I think it would be hard to get him to take an additional blood pressure medicine. We will just recheck his blood pressure in 3 months. Father is agreeable with this plan.

## 2015-01-07 NOTE — Assessment & Plan Note (Signed)
Possible slight improvement in erythema, but certainly not worse than last time. I suspect this is just chronic postoperative change. Patient and his father will continue to evaluate the foot daily. They will return if it changes or worsens. I will see him back in 3 months.

## 2015-01-07 NOTE — Patient Instructions (Signed)
Foot looks good. Continue to check on it every day and come in if it is more swollen, red, etc. Follow up in 3 months.  Be well, Dr. Pollie Meyer

## 2015-01-07 NOTE — Progress Notes (Signed)
Patient ID: Stephen Fitzgerald, male   DOB: 1960/11/16, 54 y.o.   MRN: [id number]  HPI:  Follow-up foot: Patient presents for reassessment of his right foot swelling and erythema. He is accompanied by his father. They report that they think the foot is less erythematous. Patient says he feels well and has no complaints today.  ROS: See HPI.  PMFSH: hx prior CVA with resultant expressive aphasia, HLD, DM, multiple prior amputations for advanced infections  PHYSICAL EXAM: BP 177/88 mmHg  Pulse 117  Temp(Src) 98.2 F (36.8 C) (Oral)  Ht 5\' 11"  (1.803 m)  Wt 214 lb 3.2 oz (97.16 kg)  BMI 29.89 kg/m2 Gen: No acute distress, pleasant, cooperative HEENT: Normocephalic, atraumatic Ext: Right foot with persistent swelling and erythema, most likely chronic postoperative changes, first toe on right foot surgically absent. second toe on right foot remains swollen but is nontender to palpation. Most of toes on right foot are hammered. No skin breakdown or drainage. Slightly less erythematous than last visit.  ASSESSMENT/PLAN:  Foot swelling Possible slight improvement in erythema, but certainly not worse than last time. I suspect this is just chronic postoperative change. Patient and his father will continue to evaluate the foot daily. They will return if it changes or worsens. I will see him back in 3 months.  Hypertension BP elevated today at 177/88, improving to 150/80 on recheck. Patient reports compliance with his metoprolol. I suspect much of this is whitecoat hypertension as he adamantly hates going to the doctor. I think it would be hard to get him to take an additional blood pressure medicine. We will just recheck his blood pressure in 3 months. Father is agreeable with this plan.   FOLLOW UP: F/u in 3 months for diabetes and hypertension.  [place] J. Pollie Meyer, MD [facility]

## 2015-03-21 ENCOUNTER — Other Ambulatory Visit: Payer: Self-pay | Admitting: Family Medicine

## 2015-03-23 NOTE — Telephone Encounter (Signed)
Metformin refilled  Stephen Fitzgerald A. Kennon Rounds MD, MS Family Medicine Resident PGY-2 Pager [phone]

## 2015-04-26 ENCOUNTER — Ambulatory Visit (INDEPENDENT_AMBULATORY_CARE_PROVIDER_SITE_OTHER): Payer: Medicare Other | Admitting: Student

## 2015-04-26 ENCOUNTER — Encounter: Payer: Self-pay | Admitting: Student

## 2015-04-26 VITALS — BP 158/84 | HR 116 | Temp 98.9°F | Wt 228.2 lb

## 2015-04-26 DIAGNOSIS — M7989 Other specified soft tissue disorders: Secondary | ICD-10-CM

## 2015-04-26 DIAGNOSIS — E118 Type 2 diabetes mellitus with unspecified complications: Secondary | ICD-10-CM | POA: Diagnosis present

## 2015-04-26 DIAGNOSIS — E1165 Type 2 diabetes mellitus with hyperglycemia: Secondary | ICD-10-CM | POA: Diagnosis not present

## 2015-04-26 DIAGNOSIS — Z0000 Encounter for general adult medical examination without abnormal findings: Secondary | ICD-10-CM

## 2015-04-26 LAB — POCT GLYCOSYLATED HEMOGLOBIN (HGB A1C): HEMOGLOBIN A1C: 6.7

## 2015-04-26 MED ORDER — GLIPIZIDE 5 MG PO TABS: 5.0000 mg | ORAL_TABLET | Freq: Two times a day (BID) | ORAL | Status: DC

## 2015-04-26 NOTE — Patient Instructions (Addendum)
Return in 3 months for diabetes check Your A1c today is 6.7 Eat more fruits and vegetable and less fats and sweet Schedule a colonoscopy when able

## 2015-04-27 DIAGNOSIS — Z0000 Encounter for general adult medical examination without abnormal findings: Secondary | ICD-10-CM | POA: Insufficient documentation

## 2015-04-27 NOTE — Assessment & Plan Note (Signed)
Greatly resolved. Will continue to monitor. Pt will call if foot again swells

## 2015-04-27 NOTE — Assessment & Plan Note (Signed)
A1c 6.7 will continue current glipizide regimen.  - Discussed diet and exercise and pt endorsed understanding and acceptance of this - Did express anger at having to follow up for A1c every 3 months, but agreed that it is necessary - Refilled glipizide

## 2015-04-27 NOTE — Assessment & Plan Note (Signed)
Colonoscopy information given. Pt stated he did not want ever have one. The risks of colon cancer and need to screening were discussed. Pt expressed his understanding but continued unwillingness to proceed with colonoscopy

## 2015-04-27 NOTE — Assessment & Plan Note (Signed)
Blood pressure today at 158/84, sub optimal especially in the setting of his diabetes. Currently on Metoprolol  - consider addition of ace at next visit - concern for compliance with a new blood pressure medication today as pt was very easily angered

## 2015-04-27 NOTE — Progress Notes (Signed)
Subjective:    Patient ID: Stephen Fitzgerald, male    DOB: 09/28/1960, 54 y.o.   MRN: [id number]   CC: Follow up  HPI:  54 y/o with PMH of DM with recent swelling of right second toe  DM A1c today 6.7. Pt Reports some numbness and tingling of bilateral feet but denies trauma to them. Wears protective foot wear at all times. Denies chest pain, SOB  Right toe swelling - Pt feels toe swelling and redness have improved  Pt became very easily angered when discussing the need to follow up for A1cs as well as needing to potentially take more medication. He became difficult to redirect but after some time he calmed down   Review of Systems   See HPI for ROS.   Past Medical History  Diagnosis Date  . Diabetes mellitus without complication (HCC)   . Stroke [facility])     expressive aphasia  . Hypertension   . Flexor tenosynovitis of finger 12/08/2012  . Blood poisoning (HCC)   . Bacteremia   . Abscess of hand, left 12/08/2012   Past Surgical History  Procedure Laterality Date  . No past surgeries    . I&d extremity Left 12/04/2012    Procedure: IRRIGATION AND DEBRIDEMENT Left Hand with Ring Removal  times three, Carpal Tunnel , Flexor tendon Synovectomy;  Surgeon: [name], MD;  Location: [facility] OR;  Service: Orthopedics;  Laterality: Left;  . Amputation Left 12/06/2012    Procedure: AMPUTATION DIGIT LEFT INDEX;  Surgeon: [name], MD;  Location: [facility] OR;  Service: Orthopedics;  Laterality: Left;  . I&d extremity Left 12/06/2012    Procedure: IRRIGATION AND DEBRIDEMENT EXTREMITY LEFT HAND;  Surgeon: [name], MD;  Location: [facility] OR;  Service: Orthopedics;  Laterality: Left;  . Hernia repair      bilat ing age 18months  . Tendon repair Left 12/23/2012    Procedure: DIVISION AND INSETTING FLAP TO LEFT RING FINGER ADJACENT TISSUE REARRANGEMENT LEFT INDEX FINGER AMPUTATION SITE;  Surgeon: [name], MD;  Location: [facility];  Service: Orthopedics;   Laterality: Left;  . Abdominal aortagram N/A 08/13/2014    Procedure: ABDOMINAL [name];  Surgeon: Fransisco Hertz, MD;  Location: [facility] CATH LAB;  Service: Cardiovascular;  Laterality: N/A;  . Amputation Left 08/15/2014    Procedure: AMPUTATION LEFT RING FINGER;  Surgeon: [name], MD;  Location: [facility] OR;  Service: Orthopedics;  Laterality: Left;  . Amputation Right 08/15/2014    Procedure: AMPUTATION RIGHT FOOT FIRST RAY AMPUTATION AND IRRIGATION AND DEBREDMENT OF RIGHT FOOT ABSCESS;  Surgeon: [name], MD;  Location: [facility] OR;  Service: Orthopedics;  Laterality: Right;    Social History   Social History  . Marital Status: Divorced    Spouse Name: N/A  . Number of Children: N/A  . Years of Education: N/A   Occupational History  . Not on file.   Social History Main Topics  . Smoking status: Never Smoker   . Smokeless tobacco: Never Used  . Alcohol Use: 4.8 oz/week    8 Cans of beer per week     Comment: 8-12 beers qd   . Drug Use: No  . Sexual Activity: Not on file   Other Topics Concern  . Not on file   Social History Narrative   Pt lives alone. His father checks  in on him occasionally.    Objective:  BP 158/84 mmHg  Pulse 116  Temp(Src) 98.9  F (37.2 C) (Oral)  Wt 228 lb 4 oz (103.534 kg) Vitals and nursing note reviewed  General: NAD Cardiac: RRR, no murmurs auscultated Respiratory: CTAB Abdomen: Obese, soft, non tender Extremities: See diabetic foot exam   Assessment & Plan:    Hypertension Blood pressure today at 158/84, sub optimal especially in the setting of his diabetes. Currently on Metoprolol  - consider addition of ace at next visit - concern for compliance with a new blood pressure medication today as pt was very easily angered  Diabetes mellitus type 2 with complications, uncontrolled A1c 6.7 will continue current glipizide regimen.  - Discussed diet and exercise and pt endorsed understanding and acceptance of this - Did  express anger at having to follow up for A1c every 3 months, but agreed that it is necessary - Refilled glipizide  Foot swelling Greatly resolved. Will continue to monitor. Pt will call if foot again swells  Healthcare maintenance Colonoscopy information given. Pt stated he did not want ever have one. The risks of colon cancer and need to screening were discussed. Pt expressed his understanding but continued unwillingness to proceed with colonoscopy     Durward Matranga A. Kennon Rounds MD, MS Family Medicine Resident PGY-1 Pager [phone]

## 2015-04-28 DIAGNOSIS — Z961 Presence of intraocular lens: Secondary | ICD-10-CM | POA: Diagnosis not present

## 2015-04-28 LAB — HM DIABETES EYE EXAM

## 2015-08-10 ENCOUNTER — Encounter: Payer: Self-pay | Admitting: Student

## 2015-08-10 ENCOUNTER — Ambulatory Visit (INDEPENDENT_AMBULATORY_CARE_PROVIDER_SITE_OTHER): Payer: Medicare Other | Admitting: Student

## 2015-08-10 VITALS — BP 158/84 | HR 103 | Temp 98.4°F | Ht 71.0 in | Wt 227.0 lb

## 2015-08-10 DIAGNOSIS — E1165 Type 2 diabetes mellitus with hyperglycemia: Secondary | ICD-10-CM

## 2015-08-10 DIAGNOSIS — E785 Hyperlipidemia, unspecified: Secondary | ICD-10-CM | POA: Diagnosis not present

## 2015-08-10 DIAGNOSIS — E118 Type 2 diabetes mellitus with unspecified complications: Secondary | ICD-10-CM | POA: Diagnosis not present

## 2015-08-10 LAB — POCT GLYCOSYLATED HEMOGLOBIN (HGB A1C): HEMOGLOBIN A1C: 7.2

## 2015-08-10 MED ORDER — ASPIRIN 81 MG PO TABS: 81.0000 mg | ORAL_TABLET | Freq: Every day | ORAL | Status: AC

## 2015-08-10 MED ORDER — METOPROLOL SUCCINATE ER 50 MG PO TB24: 50.0000 mg | ORAL_TABLET | Freq: Every day | ORAL | Status: DC

## 2015-08-10 NOTE — Assessment & Plan Note (Signed)
Patient's blood pressure currently managed with metoprolol succinate 50 mg daily.  Initial blood pressure reading 178/96 in clinic today but on repeat improved to 158/84.  Still above goal however patient does not desire to change his blood pressure regimen -  Continue current metoprolol regimen -  Continue to follow

## 2015-08-10 NOTE — Assessment & Plan Note (Signed)
Patient currently managed with atorvastatin 40 once a day.  Discussed re-checking lipid levels. Patient adamant that he does not want blood testing at this time or in future. -  Will consider testing at a future time should patient be willing

## 2015-08-10 NOTE — Assessment & Plan Note (Addendum)
A1c 7.2 today discussed diet regimen as well as exercise.  Patient reports eating carbohydrates as well as high-fat diet. He is concerned that if he changes his diet he will not feel full. He does walk daily. -  We'll obtain  Urine micro albumin today -  Continue current regimen of glipizide metformin -   Patient is up-to-date on ophthalmic exam last exam September 2016 -  Diabetic foot exam today 08/10/2015

## 2015-08-10 NOTE — Progress Notes (Signed)
   Subjective:    Patient ID: KEYMON MCELROY, male    DOB: Oct 17, 1960, 55 y.o.   MRN: [id number]   CC: HPI: This is a 55 year old male presenting for type 2 diabetes follow-up  Type 2 diabetes -A1c today is  7.2. His last A1c was 6.7 -He denies numbness tingling of his fingers or toes.  -However he reports  occasional tickling in his right great toe -  His father is concerned about irritation of his left second toe.  He feels his bili which is irritating this toe. -  It has unchanged redness. However there is an area of scabbing at the top of that toe  Which has been stable for the last year He denies chest pain or shortness of breath  Htn - Pt takes metoprolol succinate qD and reports compliance - He does not wish to change his blood pressure medication  Review of Systems ROS  He otherwise denies recent illness fever cough nausea vomiting or diarrhea.  he denies headache changes in vision  Past Medical, Surgical, Social, and Family History Reviewed & Updated per EMR.   Objective:  BP 158/84 mmHg  Pulse 103  Temp(Src) 98.4 F (36.9 C) (Oral)  Ht  (1.803 m)  Wt 227 lb (102.967 kg)  BMI 31.67 kg/m2 Vitals and nursing note reviewed  General: NAD Cardiac: RRR,  Respiratory: CTAB, normal effort Abdomen: obese soft, nontender, nondistended. Bowel sounds present Extremities:See quality metrics Skin: Right second toe hammer toe erythema  wiith area of scabbing at the apex of the proximal interphalangeal joint of the second toe Neuro: alert and oriented, no focal deficits   Assessment & Plan:    Hypertension  Patient's blood pressure currently managed with metoprolol succinate 50 mg daily.  Initial blood pressure reading 178/96 in clinic today but on repeat improved to 158/84.  Still above goal however patient does not desire to change his blood pressure regimen -  Continue current metoprolol regimen -  Continue to follow  Diabetes mellitus type 2 with  complications, uncontrolled  A1c 7.2 today discussed diet regimen as well as exercise.  Patient reports eating carbohydrates as well as high-fat diet. He is concerned that if he changes his diet he will not feel full. He does walk daily. -  We'll obtain  Urine micro albumin today -  Continue current regimen of glipizide metformin -   Patient is up-to-date on ophthalmic exam last exam September 2016 -  Diabetic foot exam today 08/10/2015  Hyperlipidemia  Patient currently managed with atorvastatin 40 once a day.  Discussed re-checking lipid levels. Patient adamant that he does not want blood testing at this time or in future. -  Will consider testing at a future time should patient be willing     Arienne Gartin A. Kennon Rounds MD, MS Family Medicine Resident PGY-2 Pager [phone]

## 2015-08-10 NOTE — Patient Instructions (Signed)
Follow-up with PCP in 3 months If you have questions or concerns please call the office at [phone]

## 2015-08-11 LAB — MICROALBUMIN / CREATININE URINE RATIO
Creatinine, Urine: 121 mg/dL (ref 20–370)
MICROALB UR: 1.1 mg/dL
Microalb Creat Ratio: 9 mcg/mg creat (ref <30)

## 2015-11-25 ENCOUNTER — Encounter: Payer: Self-pay | Admitting: Student

## 2015-11-25 ENCOUNTER — Ambulatory Visit (INDEPENDENT_AMBULATORY_CARE_PROVIDER_SITE_OTHER): Payer: Medicare Other | Admitting: Student

## 2015-11-25 VITALS — BP 138/64 | HR 111 | Wt 230.0 lb

## 2015-11-25 DIAGNOSIS — E118 Type 2 diabetes mellitus with unspecified complications: Secondary | ICD-10-CM | POA: Diagnosis present

## 2015-11-25 DIAGNOSIS — E1165 Type 2 diabetes mellitus with hyperglycemia: Secondary | ICD-10-CM

## 2015-11-25 LAB — POCT GLYCOSYLATED HEMOGLOBIN (HGB A1C): Hemoglobin A1C: 8.7

## 2015-11-25 MED ORDER — CLINDAMYCIN HCL 300 MG PO CAPS: 300.0000 mg | ORAL_CAPSULE | Freq: Four times a day (QID) | ORAL | Status: AC

## 2015-11-25 MED ORDER — ATORVASTATIN CALCIUM 40 MG PO TABS: ORAL_TABLET | ORAL | Status: DC

## 2015-11-25 NOTE — Progress Notes (Signed)
   Subjective:    Patient ID: Stephen Fitzgerald, male    DOB: Aug 03, 1960, 55 y.o.   MRN: [id number]   CC: Diabetes check  HPI: 55 year old male with a history of type 2 diabetes presents for diabetes check with new complaint of right toe swelling  Diabetes - A1c 8.7 today - Continues to have numbness and tingling of bilateral feet   Right toe swelling - On exam actually has an ulcer of his right second toe, which is a hammertoe - Reports that this toe rubs against his cowboy boots and he typically has a callus here - The callus fell off approximately 1 week ago - Stephen Fitzgerald has mild cognitive impairment since stroke and his father looks after him - His father last examined his toe approximately 11 days ago at which time it was normal -Denies fevers, pain of his toe, drainage  Smoking status reviewed  Review of Systems Per history of present illness otherwise denies recent illnesses, nausea, vomiting, diarrhea     Objective:  BP 138/64 mmHg  Pulse 111  Wt 104.327 kg (230 lb)  SpO2 93% Vitals and nursing note reviewed  General: NAD Cardiac: RRR  Respiratory: CTAB, normal effort Extremities: Right second hammertoe, with ulceration above the DIP joint, mild erythema over the entire 2nd toe extending to the third toe, no streaking no edema. Unable to probe to bone. No pain to palpation Skin: as above Neuro: alert and oriented   Assessment & Plan:    Diabetes mellitus type 2 with complications, uncontrolled A1c 8.7 today, now with right second toe ulcer. Evidence of cellulitis on exam but patient is afebrile with no evidence of systemic infection, unable to probe to bone. Ulceration likely due to chronic abrasion of cowboy boots. Discussed likelihood that his toe will need to be amputated.  - Started on clindamycin - Referral to wound care clinic - dressing placed over ulcer - counseled to stop wearing his boots or any other shoes that can irritate his feet -  Discussed follow-up with orthopedic surgeon. After wound care clinic appointment had been made patient called back to let us know that he made appointment with his orthopedic surgeon in one week. - Will follow closely.     Romaldo Saville A. Kennon RoundsHaney MD, MS Family Medicine Resident PGY-2 Pager [phone]

## 2015-11-25 NOTE — Assessment & Plan Note (Addendum)
A1c 8.7 today, now with right second toe ulcer. Evidence of cellulitis on exam but patient is afebrile with no evidence of systemic infection, unable to probe to bone. Ulceration likely due to chronic abrasion of cowboy boots. Discussed likelihood that his toe will need to be amputated.  - Started on clindamycin - Referral to wound care clinic - dressing placed over ulcer - counseled to stop wearing his boots or any other shoes that can irritate his feet - Discussed follow-up with orthopedic surgeon. After wound care clinic appointment had been made patient called back to let us know that he made appointment with his orthopedic surgeon in one week. - Will follow closely.

## 2015-11-25 NOTE — Patient Instructions (Addendum)
Follow up in 1 week for foot ulcer Please take Clindamycin 4 times daily, call the office if you have any issues taking this is  Please follow with the wound clinic to your foot ulcer Please follow with your orthopedic surgeon to discuss next steps If you have any questions, please call the office at [phone]

## 2015-12-02 ENCOUNTER — Ambulatory Visit: Payer: Medicare Other | Admitting: Student

## 2015-12-02 DIAGNOSIS — L089 Local infection of the skin and subcutaneous tissue, unspecified: Secondary | ICD-10-CM | POA: Diagnosis not present

## 2015-12-03 ENCOUNTER — Other Ambulatory Visit: Payer: Self-pay | Admitting: Physician Assistant

## 2015-12-06 ENCOUNTER — Other Ambulatory Visit: Payer: Self-pay | Admitting: Physician Assistant

## 2015-12-08 ENCOUNTER — Other Ambulatory Visit: Payer: Self-pay

## 2015-12-08 ENCOUNTER — Encounter (HOSPITAL_COMMUNITY)
Admission: RE | Admit: 2015-12-08 | Discharge: 2015-12-08 | Disposition: A | Discharge status: Home / Self Care | Payer: Medicare Other | Source: Ambulatory Visit | Attending: Orthopaedic Surgery | Admitting: Orthopaedic Surgery

## 2015-12-08 ENCOUNTER — Encounter (HOSPITAL_COMMUNITY): Payer: Self-pay

## 2015-12-08 DIAGNOSIS — E1165 Type 2 diabetes mellitus with hyperglycemia: Secondary | ICD-10-CM | POA: Diagnosis not present

## 2015-12-08 DIAGNOSIS — M79674 Pain in right toe(s): Secondary | ICD-10-CM | POA: Diagnosis present

## 2015-12-08 DIAGNOSIS — M869 Osteomyelitis, unspecified: Secondary | ICD-10-CM | POA: Diagnosis not present

## 2015-12-08 DIAGNOSIS — I350 Nonrheumatic aortic (valve) stenosis: Secondary | ICD-10-CM | POA: Diagnosis not present

## 2015-12-08 DIAGNOSIS — E1169 Type 2 diabetes mellitus with other specified complication: Secondary | ICD-10-CM | POA: Diagnosis not present

## 2015-12-08 DIAGNOSIS — I69320 Aphasia following cerebral infarction: Secondary | ICD-10-CM | POA: Diagnosis not present

## 2015-12-08 DIAGNOSIS — Z6830 Body mass index (BMI) 30.0-30.9, adult: Secondary | ICD-10-CM | POA: Diagnosis not present

## 2015-12-08 DIAGNOSIS — I10 Essential (primary) hypertension: Secondary | ICD-10-CM | POA: Diagnosis not present

## 2015-12-08 DIAGNOSIS — Z89022 Acquired absence of left finger(s): Secondary | ICD-10-CM | POA: Diagnosis not present

## 2015-12-08 DIAGNOSIS — Z89411 Acquired absence of right great toe: Secondary | ICD-10-CM | POA: Diagnosis not present

## 2015-12-08 DIAGNOSIS — Z7984 Long term (current) use of oral hypoglycemic drugs: Secondary | ICD-10-CM | POA: Diagnosis not present

## 2015-12-08 HISTORY — DX: Osteomyelitis, unspecified: M86.9

## 2015-12-08 LAB — CBC
HCT: 41.4 % (ref 39.0–52.0)
Hemoglobin: 13.8 g/dL (ref 13.0–17.0)
MCH: 29.7 pg (ref 26.0–34.0)
MCHC: 33.3 g/dL (ref 30.0–36.0)
MCV: 89.2 fL (ref 78.0–100.0)
PLATELETS: 273 10*3/uL (ref 150–400)
RBC: 4.64 MIL/uL (ref 4.22–5.81)
RDW: 13.4 % (ref 11.5–15.5)
WBC: 9.7 10*3/uL (ref 4.0–10.5)

## 2015-12-08 LAB — BASIC METABOLIC PANEL
Anion gap: 7 (ref 5–15)
BUN: 13 mg/dL (ref 6–20)
CALCIUM: 9.6 mg/dL (ref 8.9–10.3)
CO2: 27 mmol/L (ref 22–32)
CREATININE: 0.97 mg/dL (ref 0.61–1.24)
Chloride: 111 mmol/L (ref 101–111)
Glucose, Bld: 169 mg/dL — ABNORMAL HIGH (ref 65–99)
Potassium: 5.9 mmol/L — ABNORMAL HIGH (ref 3.5–5.1)
SODIUM: 145 mmol/L (ref 135–145)

## 2015-12-08 NOTE — Patient Instructions (Addendum)
Stephen Fitzgerald  12/08/2015   Your procedure is scheduled on: 12-10-15  [facility]  [facility]  elevators to 3rd floor to  Short Stay Center at   1:45 PM.  Call this number if you have problems the morning of surgery [phone]   Remember: ONLY 1 PERSON MAY GO WITH YOU TO SHORT STAY TO GET  READY MORNING OF YOUR SURGERY.  Do not eat food or drink liquids :After Midnight. Exception -may have Clear Liquids only 12 midnight to 0900 AM  Day of, then nothing.  Take these medicines the morning of surgery with A SIP OF WATER: Metoprolol. Atorvastatin.              Do not take any Metformin or Glipizide.  DO NOT TAKE ANY DIABETIC MEDICATIONS DAY OF YOUR SURGERY.                               You may not have any metal on your body including hair pins and              piercings  Do not wear jewelry, make-up, lotions, powders or perfumes, deodorant             Do not wear nail polish.  Do not shave  48 hours prior to surgery.              Men may shave face and neck.   Do not bring valuables to the hospital. [facility] IS NOT             RESPONSIBLE   FOR VALUABLES.  Contacts, dentures or bridgework may not be worn into surgery.  Leave suitcase in the car. After surgery it may be brought to your room.     Patients discharged the day of surgery will not be allowed to drive home.  Name and phone number of your driver: Juleen ChinaBill Geffert ,father -[phone] home  Special Instructions: N/A              Please read over the following fact sheets you were given: _____________________________________________________________________             [facility] - Preparing for Surgery Before surgery, you can play an important role.  Because skin is not sterile, your skin needs to be as free of germs as possible.  You can reduce the number of germs on your skin by washing with CHG (chlorahexidine gluconate) soap before surgery.  CHG is an antiseptic  cleaner which kills germs and bonds with the skin to continue killing germs even after washing. Please DO NOT use if you have an allergy to CHG or antibacterial soaps.  If your skin becomes reddened/irritated stop using the CHG and inform your nurse when you arrive at Short Stay. Do not shave (including legs and underarms) for at least 48 hours prior to the first CHG shower.  You may shave your face/neck. Please follow these instructions carefully:  1.  Shower with CHG Soap the night before surgery and the  morning of Surgery.  2.  If you choose to wash your hair, wash your hair first as usual with your  normal  shampoo.  3.  After you shampoo, rinse your hair and body thoroughly to remove the  shampoo.  4.  Use CHG as you would any other liquid soap.  You can apply chg directly  to the skin and wash                       Gently with a scrungie or clean washcloth.  5.  Apply the CHG Soap to your body ONLY FROM THE NECK DOWN.   Do not use on face/ open                           Wound or open sores. Avoid contact with eyes, ears mouth and genitals (private parts).                       Wash face,  Genitals (private parts) with your normal soap.             6.  Wash thoroughly, paying special attention to the area where your surgery  will be performed.  7.  Thoroughly rinse your body with warm water from the neck down.  8.  DO NOT shower/wash with your normal soap after using and rinsing off  the CHG Soap.                9.  Pat yourself dry with a clean towel.            10.  Wear clean pajamas.            11.  Place clean sheets on your bed the night of your first shower and do not  sleep with pets. Day of Surgery : Do not apply any lotions/deodorants the morning of surgery.  Please wear clean clothes to the hospital/surgery center.  FAILURE TO FOLLOW THESE INSTRUCTIONS MAY RESULT IN THE CANCELLATION OF YOUR SURGERY PATIENT  SIGNATURE_________________________________  NURSE SIGNATURE__________________________________  ________________________________________________________________________  Stephen Fitzgerald  An incentive spirometer is a tool that can help keep your lungs clear and active. This tool measures how well you are filling your lungs with each breath. Taking long deep breaths may help reverse or decrease the chance of developing breathing (pulmonary) problems (especially infection) following:  A long period of time when you are unable to move or be active. BEFORE THE PROCEDURE   If the spirometer includes an indicator to show your best effort, your nurse or respiratory therapist will set it to a desired goal.  If possible, sit up straight or lean slightly forward. Try not to slouch.  Hold the incentive spirometer in an upright position. INSTRUCTIONS FOR USE  1. Sit on the edge of your bed if possible, or sit up as far as you can in bed or on a chair. 2. Hold the incentive spirometer in an upright position. 3. Breathe out normally. 4. Place the mouthpiece in your mouth and seal your lips tightly around it. 5. Breathe in slowly and as deeply as possible, raising the piston or the ball toward the top of the column. 6. Hold your breath for 3-5 seconds or for as long as possible. Allow the piston or ball to fall to the bottom of the column. 7. Remove the mouthpiece from your mouth and breathe out normally. 8. Rest for a few seconds and repeat Steps 1 through 7 at least 10 times every 1-2 hours when you are awake. Take your time and take a few normal breaths between deep breaths. 9. The spirometer may include an indicator to show your  best effort. Use the indicator as a goal to work toward during each repetition. 10. After each set of 10 deep breaths, practice coughing to be sure your lungs are clear. If you have an incision (the cut made at the time of surgery), support your incision when coughing by  placing a pillow or rolled up towels firmly against it. Once you are able to get out of bed, walk around indoors and cough well. You may stop using the incentive spirometer when instructed by your caregiver.  RISKS AND COMPLICATIONS  Take your time so you do not get dizzy or light-headed.  If you are in pain, you may need to take or ask for pain medication before doing incentive spirometry. It is harder to take a deep breath if you are having pain. AFTER USE  Rest and breathe slowly and easily.  It can be helpful to keep track of a log of your progress. Your caregiver can provide you with a simple table to help with this. If you are using the spirometer at home, follow these instructions: SEEK MEDICAL CARE IF:   You are having difficultly using the spirometer.  You have trouble using the spirometer as often as instructed.  Your pain medication is not giving enough relief while using the spirometer.  You develop fever of 100.5 F (38.1 C) or higher. SEEK IMMEDIATE MEDICAL CARE IF:   You cough up bloody sputum that had not been present before.  You develop fever of 102 F (38.9 C) or greater.  You develop worsening pain at or near the incision site. MAKE SURE YOU:   Understand these instructions.  Will watch your condition.  Will get help right away if you are not doing well or get worse. Document Released: 11/13/2006 Document Revised: 09/25/2011 Document Reviewed: 01/14/2007 [facility] Patient Information 2014 [place], [place].   ________________________________________________________________________

## 2015-12-08 NOTE — Pre-Procedure Instructions (Addendum)
EKG done today.Father -Juleen ChinaBill Heidelberg given copy of home instructions per request of pt- he helps prepare daily  medications.

## 2015-12-09 LAB — HEMOGLOBIN A1C
HEMOGLOBIN A1C: 8.9 % — AB (ref 4.8–5.6)
MEAN PLASMA GLUCOSE: 209 mg/dL

## 2015-12-09 NOTE — Progress Notes (Signed)
12-09-15 0910 Hgb A1c = 8.9 .

## 2015-12-10 ENCOUNTER — Ambulatory Visit (HOSPITAL_COMMUNITY): Payer: Medicare Other | Admitting: Registered Nurse

## 2015-12-10 ENCOUNTER — Ambulatory Visit (HOSPITAL_COMMUNITY)
Admission: RE | Admit: 2015-12-10 | Discharge: 2015-12-10 | Disposition: A | Discharge status: Home / Self Care | Payer: Medicare Other | Source: Ambulatory Visit | Attending: Orthopaedic Surgery | Admitting: Orthopaedic Surgery

## 2015-12-10 ENCOUNTER — Encounter (HOSPITAL_COMMUNITY): Payer: Self-pay | Admitting: Registered Nurse

## 2015-12-10 ENCOUNTER — Encounter (HOSPITAL_COMMUNITY)
Admission: RE | Disposition: A | Discharge status: Home / Self Care | Payer: Self-pay | Source: Ambulatory Visit | Attending: Orthopaedic Surgery

## 2015-12-10 DIAGNOSIS — E1165 Type 2 diabetes mellitus with hyperglycemia: Secondary | ICD-10-CM | POA: Insufficient documentation

## 2015-12-10 DIAGNOSIS — I10 Essential (primary) hypertension: Secondary | ICD-10-CM | POA: Insufficient documentation

## 2015-12-10 DIAGNOSIS — E119 Type 2 diabetes mellitus without complications: Secondary | ICD-10-CM | POA: Diagnosis not present

## 2015-12-10 DIAGNOSIS — I350 Nonrheumatic aortic (valve) stenosis: Secondary | ICD-10-CM | POA: Insufficient documentation

## 2015-12-10 DIAGNOSIS — I69320 Aphasia following cerebral infarction: Secondary | ICD-10-CM | POA: Diagnosis not present

## 2015-12-10 DIAGNOSIS — Z7984 Long term (current) use of oral hypoglycemic drugs: Secondary | ICD-10-CM | POA: Insufficient documentation

## 2015-12-10 DIAGNOSIS — Z6830 Body mass index (BMI) 30.0-30.9, adult: Secondary | ICD-10-CM | POA: Insufficient documentation

## 2015-12-10 DIAGNOSIS — Z89411 Acquired absence of right great toe: Secondary | ICD-10-CM | POA: Insufficient documentation

## 2015-12-10 DIAGNOSIS — Z89022 Acquired absence of left finger(s): Secondary | ICD-10-CM | POA: Insufficient documentation

## 2015-12-10 DIAGNOSIS — E1142 Type 2 diabetes mellitus with diabetic polyneuropathy: Secondary | ICD-10-CM | POA: Diagnosis not present

## 2015-12-10 DIAGNOSIS — L089 Local infection of the skin and subcutaneous tissue, unspecified: Secondary | ICD-10-CM | POA: Diagnosis not present

## 2015-12-10 DIAGNOSIS — E1169 Type 2 diabetes mellitus with other specified complication: Secondary | ICD-10-CM | POA: Diagnosis not present

## 2015-12-10 DIAGNOSIS — I70261 Atherosclerosis of native arteries of extremities with gangrene, right leg: Secondary | ICD-10-CM | POA: Diagnosis not present

## 2015-12-10 DIAGNOSIS — M869 Osteomyelitis, unspecified: Secondary | ICD-10-CM | POA: Insufficient documentation

## 2015-12-10 HISTORY — PX: AMPUTATION: SHX166

## 2015-12-10 LAB — GLUCOSE, CAPILLARY
GLUCOSE-CAPILLARY: 145 mg/dL — AB (ref 65–99)
GLUCOSE-CAPILLARY: 148 mg/dL — AB (ref 65–99)

## 2015-12-10 SURGERY — AMPUTATION, FOOT, RAY
Anesthesia: General | Site: Foot | Laterality: Right

## 2015-12-10 MED ORDER — OXYCODONE HCL 5 MG/5ML PO SOLN
5.0000 mg | Freq: Once | ORAL | Status: DC | PRN
  Filled 2015-12-10: qty 5

## 2015-12-10 MED ORDER — ONDANSETRON HCL 4 MG/2ML IJ SOLN
INTRAMUSCULAR | Status: DC | PRN
  Administered 2015-12-10: 4 mg via INTRAVENOUS

## 2015-12-10 MED ORDER — SODIUM CHLORIDE 0.9 % IV SOLN
INTRAVENOUS | Status: DC
  Administered 2015-12-10: 14:00:00 via INTRAVENOUS

## 2015-12-10 MED ORDER — ONDANSETRON HCL 4 MG/2ML IJ SOLN
INTRAMUSCULAR | Status: AC
  Filled 2015-12-10: qty 2

## 2015-12-10 MED ORDER — PROPOFOL 10 MG/ML IV BOLUS
INTRAVENOUS | Status: DC | PRN
  Administered 2015-12-10: 160 mg via INTRAVENOUS

## 2015-12-10 MED ORDER — MIDAZOLAM HCL 2 MG/2ML IJ SOLN
INTRAMUSCULAR | Status: AC
  Filled 2015-12-10: qty 2

## 2015-12-10 MED ORDER — LIDOCAINE HCL (CARDIAC) 10 MG/ML IV SOLN
INTRAVENOUS | Status: DC | PRN
  Administered 2015-12-10: 50 mg via INTRAVENOUS

## 2015-12-10 MED ORDER — CLINDAMYCIN PHOSPHATE 900 MG/50ML IV SOLN
900.0000 mg | INTRAVENOUS | Status: AC
  Administered 2015-12-10: 900 mg via INTRAVENOUS

## 2015-12-10 MED ORDER — MIDAZOLAM HCL 5 MG/5ML IJ SOLN
INTRAMUSCULAR | Status: DC | PRN
  Administered 2015-12-10: 2 mg via INTRAVENOUS

## 2015-12-10 MED ORDER — LACTATED RINGERS IV SOLN
INTRAVENOUS | Status: DC | PRN
  Administered 2015-12-10: 14:00:00 via INTRAVENOUS

## 2015-12-10 MED ORDER — OXYCODONE HCL 5 MG PO TABS: 5.0000 mg | ORAL_TABLET | Freq: Once | ORAL | Status: DC | PRN

## 2015-12-10 MED ORDER — PROPOFOL 10 MG/ML IV BOLUS
INTRAVENOUS | Status: AC
  Filled 2015-12-10: qty 20

## 2015-12-10 MED ORDER — FENTANYL CITRATE (PF) 100 MCG/2ML IJ SOLN
INTRAMUSCULAR | Status: AC
  Filled 2015-12-10: qty 2

## 2015-12-10 MED ORDER — FENTANYL CITRATE (PF) 100 MCG/2ML IJ SOLN
INTRAMUSCULAR | Status: DC | PRN
  Administered 2015-12-10: 50 ug via INTRAVENOUS
  Administered 2015-12-10 (×2): 25 ug via INTRAVENOUS

## 2015-12-10 MED ORDER — HYDROCODONE-ACETAMINOPHEN 5-325 MG PO TABS: 1.0000 | ORAL_TABLET | ORAL | Status: DC | PRN

## 2015-12-10 MED ORDER — CHLORHEXIDINE GLUCONATE 4 % EX LIQD: 60.0000 mL | Freq: Once | CUTANEOUS | Status: DC

## 2015-12-10 MED ORDER — CLINDAMYCIN PHOSPHATE 900 MG/50ML IV SOLN
INTRAVENOUS | Status: AC
  Filled 2015-12-10: qty 50

## 2015-12-10 MED ORDER — LIDOCAINE HCL (CARDIAC) 20 MG/ML IV SOLN
INTRAVENOUS | Status: AC
  Filled 2015-12-10: qty 5

## 2015-12-10 MED ORDER — DEXAMETHASONE SODIUM PHOSPHATE 10 MG/ML IJ SOLN
INTRAMUSCULAR | Status: AC
Start: 2015-12-10 — End: 2015-12-10
  Filled 2015-12-10: qty 1

## 2015-12-10 MED ORDER — PHENYLEPHRINE HCL 10 MG/ML IJ SOLN
INTRAMUSCULAR | Status: DC | PRN
  Administered 2015-12-10: 80 ug via INTRAVENOUS

## 2015-12-10 MED ORDER — HYDROMORPHONE HCL 1 MG/ML IJ SOLN: 0.2500 mg | INTRAMUSCULAR | Status: DC | PRN

## 2015-12-10 MED ORDER — MEPERIDINE HCL 50 MG/ML IJ SOLN: 6.2500 mg | INTRAMUSCULAR | Status: DC | PRN

## 2015-12-10 SURGICAL SUPPLY — 33 items
BAG ZIPLOCK 12X15 (MISCELLANEOUS) ×3 IMPLANT
BANDAGE ACE 4X5 VEL STRL LF (GAUZE/BANDAGES/DRESSINGS) IMPLANT
BANDAGE ACE 6X5 VEL STRL LF (GAUZE/BANDAGES/DRESSINGS) IMPLANT
BANDAGE ELASTIC 4 VELCRO ST LF (GAUZE/BANDAGES/DRESSINGS) ×3 IMPLANT
BANDAGE ESMARK 6X9 LF (GAUZE/BANDAGES/DRESSINGS) ×1 IMPLANT
BNDG COHESIVE 4X5 TAN STRL (GAUZE/BANDAGES/DRESSINGS) IMPLANT
BNDG ESMARK 6X9 LF (GAUZE/BANDAGES/DRESSINGS) ×3
BNDG GAUZE ELAST 4 BULKY (GAUZE/BANDAGES/DRESSINGS) ×3 IMPLANT
CUFF TOURN SGL QUICK 34 (TOURNIQUET CUFF) ×2
CUFF TRNQT CYL 34X4X40X1 (TOURNIQUET CUFF) ×1 IMPLANT
DRAPE U-SHAPE 47X51 STRL (DRAPES) ×3 IMPLANT
DURAPREP 26ML APPLICATOR (WOUND CARE) ×3 IMPLANT
ELECT REM PT RETURN 9FT ADLT (ELECTROSURGICAL) ×3
ELECTRODE REM PT RTRN 9FT ADLT (ELECTROSURGICAL) ×1 IMPLANT
GAUZE SPONGE 4X4 12PLY STRL (GAUZE/BANDAGES/DRESSINGS) ×3 IMPLANT
GAUZE XEROFORM 5X9 LF (GAUZE/BANDAGES/DRESSINGS) ×3 IMPLANT
GLOVE BIOGEL PI IND STRL 8 (GLOVE) ×1 IMPLANT
GLOVE BIOGEL PI INDICATOR 8 (GLOVE) ×2
GLOVE ECLIPSE 8.0 STRL XLNG CF (GLOVE) ×3 IMPLANT
GLOVE ORTHO TXT STRL SZ7.5 (GLOVE) ×3 IMPLANT
GOWN STRL REUS W/TWL XL LVL3 (GOWN DISPOSABLE) ×3 IMPLANT
KIT BASIN OR (CUSTOM PROCEDURE TRAY) ×3 IMPLANT
NS IRRIG 1000ML POUR BTL (IV SOLUTION) ×3 IMPLANT
PACK TOTAL JOINT (CUSTOM PROCEDURE TRAY) ×3 IMPLANT
PAD CAST 4YDX4 CTTN HI CHSV (CAST SUPPLIES) IMPLANT
PADDING CAST COTTON 4X4 STRL (CAST SUPPLIES)
POSITIONER SURGICAL ARM (MISCELLANEOUS) ×3 IMPLANT
SPONGE LAP 18X18 X RAY DECT (DISPOSABLE) ×6 IMPLANT
STAPLER VISISTAT 35W (STAPLE) IMPLANT
STOCKINETTE 8 INCH (MISCELLANEOUS) ×3 IMPLANT
SUT ETHILON 2 0 PSLX (SUTURE) ×6 IMPLANT
TOWEL OR 17X26 10 PK STRL BLUE (TOWEL DISPOSABLE) ×9 IMPLANT
WATER STERILE IRR 1500ML POUR (IV SOLUTION) IMPLANT

## 2015-12-10 NOTE — H&P (Signed)
Stephen Fitzgerald is an 55 y.o. male.   Chief Complaint: right foot 2nd toe drainage and pain; known infection HPI:   55 yo diabetic male with a history of poorly controlled diabetes.  Has has previous foot surgery due to wounds and infection.  Now has developed osteomyelitis of his right foot 2nd toe with pain and drainage.  Past Medical History  Diagnosis Date  . 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 Hypertension   . Flexor tenosynovitis of finger 12/08/2012  . Blood poisoning ([place])   . Bacteremia   . Abscess of hand, left 12/08/2012    left hand with only 3rd to 5th finger, 4th finger to midjoint.- reddened but dry and intact scar line.  . Diabetes mellitus without complication (HCC)     not usually checking blood sugars daily.  . Toe osteomyelitis, right ([place])     right second toe    Past Surgical History  Procedure Laterality Date  . No past surgeries    . I&d extremity Left 12/04/2012    Procedure: IRRIGATION AND DEBRIDEMENT Left Hand with Ring Removal  times three, Carpal Tunnel , Flexor tendon Synovectomy;  Surgeon: Jolyn Nap, MD;  Location: [place];  Service: Orthopedics;  Laterality: Left;  . Amputation Left 12/06/2012    Procedure: AMPUTATION DIGIT LEFT INDEX;  Surgeon: Jolyn Nap, MD;  Location: [place];  Service: Orthopedics;  Laterality: Left;  . I&d extremity Left 12/06/2012    Procedure: IRRIGATION AND DEBRIDEMENT EXTREMITY LEFT HAND;  Surgeon: Jolyn Nap, MD;  Location: [place];  Service: Orthopedics;  Laterality: Left;  . Hernia repair      bilat ing age 78month  . Tendon repair Left 12/23/2012    Procedure: DIVISION AND INSETTING FLAP TO LEFT RING FINGER ADJACENT TISSUE REARRANGEMENT LEFT INDEX FINGER AMPUTATION SITE;  Surgeon: DJolyn Nap MD;  [place]  Service: Orthopedics;  Laterality: Left;  . Abdominal aortagram N/A 08/13/2014    Procedure: ABDOMINAL AMaxcine Ham  Surgeon: BConrad Del Rey Oaks MD;  [facility];   Service: Cardiovascular;  Laterality: N/A;  . Amputation Left 08/15/2014    Procedure: AMPUTATION LEFT RING FINGER;  Surgeon: CMcarthur Rossetti MD;  [place]  Service: Orthopedics;  Laterality: Left;  . Amputation Right 08/15/2014    Procedure: AMPUTATION RIGHT FOOT FIRST RAY AMPUTATION AND IRRIGATION AND [place] RIGHT FOOT ABSCESS;  Surgeon: CMcarthur Rossetti MD;  [place]  Service: Orthopedics;  Laterality: Right;    No family history on file. Social History:  reports that he has never smoked. He has never used smokeless tobacco. He reports that he drinks about 4.8 oz of alcohol per week. He reports that he does not use illicit drugs.  Allergies:  Allergies  Allergen Reactions  . Unasyn [Ampicillin-Sulbactam Sodium] Rash    No prescriptions prior to admission    Results for orders placed or performed during the hospital encounter of 12/08/15 (from the past 48 hour(s))  CBC     Status: None   Collection Time: 12/08/15  2:30 PM  Result Value Ref Range   WBC 9.7 4.0 - 10.5 K/uL   RBC 4.64 4.22 - 5.81 MIL/uL   Hemoglobin 13.8 13.0 - 17.0 g/dL   HCT 41.4 39.0 - 52.0 %   MCV 89.2 78.0 - 100.0 fL   MCH 29.7 26.0 - 34.0 pg   MCHC 33.3 30.0 - 36.0 g/dL   RDW 13.4  11.5 - 15.5 %   Platelets 273 150 - 400 K/uL  Hemoglobin A1c     Status: Abnormal   Collection Time: 12/08/15  2:30 PM  Result Value Ref Range   Hgb A1c MFr Bld 8.9 (H) 4.8 - 5.6 %    Comment: (NOTE)         Pre-diabetes: 5.7 - 6.4         Diabetes: >6.4         Glycemic control for adults with diabetes: <7.0    Mean Plasma Glucose 209 mg/dL    Comment: (NOTE) Performed At: [facility] [place], [place] [id number] Lindon Romp MD [id number]   Basic metabolic panel     Status: Abnormal   Collection Time: 12/08/15  2:30 PM  Result Value Ref Range   Sodium 145 135 - 145 mmol/L   Potassium 5.9 (H) 3.5 - 5.1 mmol/L   Chloride 111 101 - 111 mmol/L   CO2 27  22 - 32 mmol/L   Glucose, Bld 169 (H) 65 - 99 mg/dL   BUN 13 6 - 20 mg/dL   Creatinine, Ser 0.97 0.61 - 1.24 mg/dL   Calcium 9.6 8.9 - 10.3 mg/dL   GFR calc non Af Amer >60 >60 mL/min   GFR calc Af Amer >60 >60 mL/min    Comment: (NOTE) The eGFR has been calculated using the CKD EPI equation. This calculation has not been validated in all clinical situations. eGFR's persistently <60 mL/min signify possible Chronic Kidney Disease.    Anion gap 7 5 - 15   No results found.  Review of Systems  All other systems reviewed and are negative.   There were no vitals taken for this visit. Physical Exam  Constitutional: He is oriented to person, place, and time. He appears well-developed and well-nourished.  HENT:  Head: Normocephalic.  Eyes: Pupils are equal, round, and reactive to light.  Neck: Normal range of motion.  Cardiovascular: Normal rate and regular rhythm.   Respiratory: Effort normal.  GI: Soft. Bowel sounds are normal.  Musculoskeletal:       Right foot: There is swelling and deformity.       Feet:  Neurological: He is alert and oriented to person, place, and time.  Skin: Skin is warm and dry.  Psychiatric: He has a normal mood and affect.     Assessment/Plan Right foot with 2nd toe infection 1)  To the OR today for an amputation of his right 2nd toe given the presence of osteomyelitis and his poorly controlled diabetes.  He understands this fully and informed consent is obtained after a discussion of the risks and benefits of surgery.  Mcarthur Rossetti, MD 12/10/2015, 9:18 AM

## 2015-12-10 NOTE — Anesthesia Preprocedure Evaluation (Addendum)
Anesthesia Evaluation  Patient identified by MRN, date of birth, ID band Patient awake    Reviewed: Allergy & Precautions, NPO status , Patient's Chart, lab work & pertinent test results, reviewed documented beta blocker date and time   Airway Mallampati: I  TM Distance: >3 FB Neck ROM: Full    Dental  (+) Teeth Intact, Dental Advisory Given   Pulmonary    breath sounds clear to auscultation       Cardiovascular hypertension, Pt. on medications and Pt. on home beta blockers + Valvular Problems/Murmurs (Moderate AS) AS  Rhythm:Regular Rate:Normal  Study Conclusions  - Left ventricle: The cavity size was normal. Wall thickness was normal. Systolic function was normal. The estimated ejection fraction was in the range of 55% to 60%. Wall motion was normal; there were no regional wall motion abnormalities. Doppler parameters are consistent with abnormal left ventricular relaxation (grade 1 diastolic dysfunction). - Aortic valve: There was mild stenosis. Valve area (VTI): 1.35 cm^2. Valve area (Vmax): 1.18 cm^2. Valve area (Vmean): 1.16 cm^2.   Neuro/Psych    GI/Hepatic   Endo/Other  diabetes, Well Controlled, Type 2, Oral Hypoglycemic AgentsMorbid obesity  Renal/GU      Musculoskeletal   Abdominal   Peds  Hematology   Anesthesia Other Findings   Reproductive/Obstetrics                           Anesthesia Physical Anesthesia Plan  ASA: III  Anesthesia Plan: General   Post-op Pain Management:    Induction: Intravenous  Airway Management Planned: LMA  Additional Equipment:   Intra-op Plan:   Post-operative Plan: Extubation in OR  Informed Consent: I have reviewed the patients History and Physical, chart, labs and discussed the procedure including the risks, benefits and alternatives for the proposed anesthesia with the patient or authorized representative who has  indicated his/her understanding and acceptance.   Dental advisory given  Plan Discussed with: CRNA, Anesthesiologist and Surgeon  Anesthesia Plan Comments:         Anesthesia Quick Evaluation

## 2015-12-10 NOTE — Anesthesia Procedure Notes (Signed)
Procedure Name: Intubation Date/Time: 12/10/2015 2:49 PM Performed by: Enriqueta ShutterWILLIFORD, Aujanae Mccullum D Pre-anesthesia Checklist: Patient identified, Emergency Drugs available, Suction available and Patient being monitored Patient Re-evaluated:Patient Re-evaluated prior to inductionOxygen Delivery Method: Circle System Utilized Preoxygenation: Pre-oxygenation with 100% oxygen Intubation Type: IV induction Ventilation: Mask ventilation without difficulty LMA Size: 4.0 Number of attempts: 1 Airway Equipment and Method: Oral airway Placement Confirmation: positive ETCO2 and breath sounds checked- equal and bilateral Tube secured with: Tape Dental Injury: Teeth and Oropharynx as per pre-operative assessment

## 2015-12-10 NOTE — Transfer of Care (Signed)
Immediate Anesthesia Transfer of Care Note  Patient: Stephen Fitzgerald  Procedure(s) Performed: Procedure(s): AMPUTATION RIGHT 2ND RAY (Right)  Patient Location: PACU  Anesthesia Type:General  Level of Consciousness: awake, alert  and oriented  Airway & Oxygen Therapy: Patient Spontanous Breathing and Patient connected to face mask oxygen  Post-op Assessment: Report given to RN and Post -op Vital signs reviewed and stable  Post vital signs: Reviewed and stable  Last Vitals:  Filed Vitals:   12/10/15 1342  BP: 150/88  Pulse: 95  Temp: 37 C  Resp: 20    Last Pain: There were no vitals filed for this visit.       Complications: No apparent anesthesia complications

## 2015-12-10 NOTE — Anesthesia Postprocedure Evaluation (Signed)
Anesthesia Post Note  Patient: Merrily PewJeffrey W Valenza  Procedure(s) Performed: Procedure(s) (LRB): AMPUTATION RIGHT 2ND RAY (Right)  Patient location during evaluation: PACU Anesthesia Type: General Level of consciousness: awake and alert Pain management: pain level controlled Vital Signs Assessment: post-procedure vital signs reviewed and stable Respiratory status: spontaneous breathing, nonlabored ventilation and respiratory function stable Cardiovascular status: blood pressure returned to baseline and stable Postop Assessment: no signs of nausea or vomiting Anesthetic complications: no    Last Vitals:  Filed Vitals:   12/10/15 1614 12/10/15 1623  BP: 132/85 153/83  Pulse:  86  Temp: 37.1 C 36.7 C  Resp:  19    Last Pain:  Filed Vitals:   12/10/15 1631  PainSc: 0-No pain                 Aleja Yearwood A

## 2015-12-10 NOTE — Discharge Instructions (Signed)
You may put all of your weight on your right foot in your post-op shoe. Keep your dressing clean and dry. You can remove your dressing in 6 days and start getting your foot wet in the shower daily. In 6 days after you remove your dressing and shower, place a new dry dressing daily       General Anesthesia, Adult, Care After Refer to this sheet in the next few weeks. These instructions provide you with information on caring for yourself after your procedure. Your health care provider may also give you more specific instructions. Your treatment has been planned according to current medical practices, but problems sometimes occur. Call your health care provider if you have any problems or questions after your procedure. WHAT TO EXPECT AFTER THE PROCEDURE After the procedure, it is typical to experience:  Sleepiness.  Nausea and vomiting. HOME CARE INSTRUCTIONS  For the first 24 hours after general anesthesia:  Have a responsible person with you.  Do not drive a car. If you are alone, do not take public transportation.  Do not drink alcohol.  Do not take medicine that has not been prescribed by your health care provider.  Do not sign important papers or make important decisions.  You may resume a normal diet and activities as directed by your health care provider.  Change bandages (dressings) as directed.  If you have questions or problems that seem related to general anesthesia, call the hospital and ask for the anesthetist or anesthesiologist on call. SEEK MEDICAL CARE IF:  You have nausea and vomiting that continue the day after anesthesia.  You develop a rash. SEEK IMMEDIATE MEDICAL CARE IF:   You have difficulty breathing.  You have chest pain.  You have any allergic problems.   This information is not intended to replace advice given to you by your health care provider. Make sure you discuss any questions you have with your health care provider.   Document  Released: 10/09/2000 Document Revised: 07/24/2014 Document Reviewed: 11/01/2011 Elsevier Interactive Patient Education [employer]2016 Elsevier Inc.

## 2015-12-10 NOTE — Progress Notes (Signed)
Discussed with patient and father wound care dressings for right foot after amputation of toe. Family provided with 4x4 gauze, kerlix, and paper tape. Patient had to apply dressing to right foot last year. He still has two ace wraps to secure dressing at home. To start dressing changes 12/16/15 after 6 days of original dressing. He has wooden shoe for right foot to go home in. Tolerates weight bearing with wooden shoe on.

## 2015-12-10 NOTE — Brief Op Note (Signed)
12/10/2015  3:16 PM  PATIENT:  Merrily PewJeffrey W Lajara  55 y.o. male  PRE-OPERATIVE DIAGNOSIS:  Osteomyelitis right 2nd toe  POST-OPERATIVE DIAGNOSIS:  * No post-op diagnosis entered *  PROCEDURE:  Procedure(s): AMPUTATION RIGHT 2ND RAY (Right)  SURGEON:  Surgeon(s) and Role:    * Kathryne Hitchhristopher Y Jamale Spangler, MD - Primary  PHYSICIAN ASSISTANT: Rexene EdisonGil Clark, PA-C  ANESTHESIA:   general  EBL:   minimal  COUNTS:  YES  DICTATION: .Other Dictation: Dictation Number [id number]  PLAN OF CARE: Discharge to home after PACU  PATIENT DISPOSITION:  PACU - hemodynamically stable.   Delay start of Pharmacological VTE agent (>24hrs) due to surgical blood loss or risk of bleeding: no

## 2015-12-11 NOTE — Op Note (Signed)
NAMCharlynne Fitzgerald:  Stephen Fitzgerald, Stephen Fitzgerald           ACCOUNT NO.:  [id number]  MEDICAL RECORD NO.:  [id number]  LOCATION:  [facility]                         [facility]  PHYSICIAN:  Vanita PandaChristopher Y. Magnus IvanBlackman, M.D.DATE OF BIRTH:  1961-05-06  DATE OF PROCEDURE:  12/10/2015 DATE OF DISCHARGE:  12/10/2015                              OPERATIVE REPORT   PREOPERATIVE DIAGNOSIS:  Right foot second toe infection with known osteomyelitis.  POSTOPERATIVE DIAGNOSIS:  Right foot second toe infection with known osteomyelitis.  PROCEDURE:  Right foot second toe amputation through the metatarsal/second ray resection.  SURGEON:  Vanita PandaChristopher Y. Magnus IvanBlackman, M.D.  ASSISTANT:  Richardean CanalGilbert Clark, PA-C.  ANESTHESIA:  General.  BLOOD LOSS:  Minimal.  COMPLICATIONS:  None.  INDICATIONS:  [name] is a 55 year old diabetic male who has developed a chronic wound over the dorsum of his right second toe due to occult deformity.  He has had a previous great toe amputation on that right foot.  He is a diabetic.  He has x-rays of his foot that show evidence of osteomyelitis in the proximal phalanx on the metatarsal head.  At this point, we recommending a second ray resection due to the active infection going on his foot.  He and his parents understand this and did wish to proceed with surgery.  PROCEDURE DESCRIPTION:  After informed consent was obtained, appropriate right foot second toe was marked.  He was brought to the operating room and placed supine on the operating table.  General anesthesia was then obtained.  His right foot was prepped and draped with DuraPrep and then Betadine paint.  Time-out was called.  He was identified as correct patient and correct right foot second toe.  We then used an Esmarch surround the ankle as a local tourniquet.  I ellipsed out the second toe and then dissected down to the MTP joint finding an evidence of osteomyelitis of the metatarsal head.  We then made a cut of that  using cutting forceps of the metatarsal just proximal to the metatarsal neck. We then irrigated the soft tissue in this area with normal saline solution.  We let the Esmarch tourniquet down and hemostasis was obtained with electrocautery.  We then reapproximated the skin with interrupted 2-0 nylon suture.  Xeroform and well-padded sterile dressing were applied.  He was awakened, extubated and taken to the recovery room in stable condition.  All final counts were correct.  There were no complications noted.  Postoperatively, we will have him on the postoperative shoe and let him weightbear as tolerated.  We will give him instructions on dressing changes and can discharge him to home from the PACU with followup in my office in 2 weeks.     Vanita Pandahristopher Y. Magnus IvanBlackman, M.D.     CYB/MEDQ  D:  12/10/2015  T:  12/11/2015  Job:  [id number]

## 2015-12-14 ENCOUNTER — Encounter (HOSPITAL_COMMUNITY): Payer: Self-pay | Admitting: Orthopaedic Surgery

## 2016-03-08 ENCOUNTER — Other Ambulatory Visit: Payer: Self-pay | Admitting: Student

## 2016-03-23 DIAGNOSIS — L089 Local infection of the skin and subcutaneous tissue, unspecified: Secondary | ICD-10-CM | POA: Diagnosis not present

## 2016-03-23 DIAGNOSIS — E1142 Type 2 diabetes mellitus with diabetic polyneuropathy: Secondary | ICD-10-CM | POA: Diagnosis not present

## 2016-03-23 DIAGNOSIS — I70262 Atherosclerosis of native arteries of extremities with gangrene, left leg: Secondary | ICD-10-CM | POA: Diagnosis not present

## 2016-03-23 DIAGNOSIS — I70261 Atherosclerosis of native arteries of extremities with gangrene, right leg: Secondary | ICD-10-CM | POA: Diagnosis not present

## 2016-05-01 DIAGNOSIS — E113291 Type 2 diabetes mellitus with mild nonproliferative diabetic retinopathy without macular edema, right eye: Secondary | ICD-10-CM | POA: Diagnosis not present

## 2016-05-01 DIAGNOSIS — H524 Presbyopia: Secondary | ICD-10-CM | POA: Diagnosis not present

## 2016-06-02 ENCOUNTER — Telehealth: Payer: Self-pay | Admitting: Student

## 2016-06-02 NOTE — Telephone Encounter (Signed)
No Answer. Left message asking to call back -Mesha Guinyard

## 2016-06-07 ENCOUNTER — Other Ambulatory Visit: Payer: Self-pay | Admitting: Student

## 2016-07-20 ENCOUNTER — Ambulatory Visit (INDEPENDENT_AMBULATORY_CARE_PROVIDER_SITE_OTHER): Payer: Self-pay | Admitting: Orthopedic Surgery

## 2016-07-20 ENCOUNTER — Ambulatory Visit (INDEPENDENT_AMBULATORY_CARE_PROVIDER_SITE_OTHER): Payer: Medicare Other | Admitting: Orthopaedic Surgery

## 2016-07-20 DIAGNOSIS — S91102A Unspecified open wound of left great toe without damage to nail, initial encounter: Secondary | ICD-10-CM | POA: Diagnosis not present

## 2016-07-20 NOTE — Progress Notes (Signed)
[name] is well-known to us. He comes in today with a wound on his left foot great toe. He sustained this after mechanical fall. He is a diabetic. He's had osteomyelitis of his right foot great toe and had to have and rotation of that before.  Generalization of his left foot there is no eschar just medial to the great toe is not a ring joint is no exposed bone. There is no gross purulence coming out from this. Detail useful in a red at some point but now it's back down to almost normal appearing skin except for the eschar this about a size of a dime in diameter. He says his blood glucose is running better.  On 1 have an alternate antibiotic ointment and isosorbide ointment every other day on this wound. He can get it wet in the shower but does not need to soak it. I'll see him back in 3 weeks to see how is doing overall. Obviously if he develops any type of evidence infection she will call us sooner. Hopefully this will do well with appropriate wound care

## 2016-08-02 ENCOUNTER — Ambulatory Visit: Payer: Medicare Other | Admitting: Student

## 2016-08-10 ENCOUNTER — Ambulatory Visit (INDEPENDENT_AMBULATORY_CARE_PROVIDER_SITE_OTHER): Payer: Medicare Other

## 2016-08-10 ENCOUNTER — Encounter (INDEPENDENT_AMBULATORY_CARE_PROVIDER_SITE_OTHER): Payer: Self-pay | Admitting: Physician Assistant

## 2016-08-10 ENCOUNTER — Ambulatory Visit (INDEPENDENT_AMBULATORY_CARE_PROVIDER_SITE_OTHER): Payer: Medicare Other | Admitting: Physician Assistant

## 2016-08-10 DIAGNOSIS — L089 Local infection of the skin and subcutaneous tissue, unspecified: Secondary | ICD-10-CM

## 2016-08-10 MED ORDER — SULFAMETHOXAZOLE-TRIMETHOPRIM 800-160 MG PO TABS: 1.0000 | ORAL_TABLET | Freq: Two times a day (BID) | ORAL | 0 refills | Status: DC

## 2016-08-10 NOTE — Progress Notes (Signed)
Office Visit Note   Patient: Stephen Fitzgerald           Date of Birth: 1961/05/05           MRN: [id number] Visit Date: 08/10/2016              Requested by: Bonney AidAlyssa A Haney, MD [address] [place], [place] [address] PCP: Velora HecklerHaney,Alyssa, MD   Assessment & Plan: Visit Diagnoses:  1. Toe infection     Plan: He will wash the foot daily and try applying dry clean dressing. Place him on Bactrim DS. Follow-up 2 weeks sooner if there is any change in the redness of his leg or the toe has purulent drainage.  Follow-Up Instructions: Return in about 2 weeks (around 08/24/2016).   Orders:  Orders Placed This Encounter  Procedures  . XR Toe Great Left   Meds ordered this encounter  Medications  . sulfamethoxazole-trimethoprim (BACTRIM DS,SEPTRA DS) 800-160 MG tablet    Sig: Take 1 tablet by mouth 2 (two) times daily.    Dispense:  14 tablet    Refill:  0      Procedures: No procedures performed   Clinical Data: No additional findings.   Subjective: Chief Complaint  Patient presents with  . Left Foot - Wound Check  . Follow-up    HPI  Stephen Fitzgerald returns today with his father for follow-up of his left great toe wound. He states that the toe has some odor and some blackness to it secondary to the antibiotic  cream that he is applying. Review of Systems   Objective: Vital Signs: There were no vitals taken for this visit.  Physical Exam  Constitutional: He appears well-developed and well-nourished. No distress.  Neurological: He is alert.    Ortho Exam Great toe with slight malodor. Eschar over the plantar surface of the great toe. No expressible purulence some serosanguineous drainage. Left lower leg medial aspect with distal 1/4th region with cellulitis. Specialty Comments:  No specialty comments available.  Imaging: Xr Toe Great Left  Result Date: 08/10/2016 3 views left great toe: No obvious osteomyelitis. Tissue edema noted of the great toe. Acute  fracture    PMFS History: Patient Active Problem List   Diagnosis Date Noted  . Osteomyelitis of toe of right foot (HCC) 12/10/2015  . Healthcare maintenance 04/27/2015  . Foot swelling 12/16/2014  . Amputation of left hand ring finger 09/23/2014  . 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 History of osteomyelitis 08/26/2014  . Serum albumin decreased 08/26/2014  . Vitamin B12 deficiency 08/18/2014  . Heart murmur, systolic   . Heart murmur 07/24/2013  . Hyperlipidemia 03/06/2013  . Hypertension 03/06/2013  . Tachycardia 01/13/2013  . Diabetes mellitus type 2 with complications, uncontrolled (HCC) 12/08/2012  . Expressive aphasia syndrome 12/08/2012  . 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 Abscess of hand, left 12/08/2012   left hand with only 3rd to 5th finger, 4th finger to midjoint.- reddened but dry and intact scar line.  . Bacteremia   . Blood poisoning (HCC)   . Diabetes mellitus without complication (HCC)    not usually checking blood sugars daily.  . Flexor tenosynovitis of finger 12/08/2012  . Hypertension   . Stroke [facility](HCC)    expressive aphasia  . Toe osteomyelitis, right (HCC)    right second toe    No family history on file.  Past Surgical History:  Procedure Laterality Date  .  ABDOMINAL AORTAGRAM N/A 08/13/2014   Procedure: ABDOMINAL [name];  Surgeon: Fransisco Hertz, MD;  Location: [facility] C[facility] LAB;  Service: Cardiovascular;  Laterality: N/A;  . AMPUTATION Left 12/06/2012   Procedure: AMPUTATION DIGIT LEFT INDEX;  Surgeon: [name], MD;  [facility];  Service: Orthopedics;  Laterality: Left;  . AMPUTATION Left 08/15/2014   Procedure: AMPUTATION LEFT RING FINGER;  Surgeon: [name], MD;  Location[facility] OR;  Service: Orthopedics;  Laterality: Left;  . AMPUTATION Right 08/15/2014   Procedure: AMPUTATION RIGHT FOOT FIRST RAY AMPUTATION AND IRRIGATION AND DEBREDMENT OF RIGHT FOOT ABSCESS;   Surgeon: [name], MD;  Location: [facility] OR;  Service: Orthopedics;  Laterality: Right;  . AMPUTATION Right 12/10/2015   Procedure: AMPUTATION RIGHT 2ND RAY;  Surgeon: [name], MD;  Location: [facility];  Service: Orthopedics;  Laterality: Right;  . HERNIA REPAIR     bilat ing age 18months  . I&D EXTREMITY Left 12/04/2012   Procedure: IRRIGATION AND DEBRIDEMENT Left Hand with Ring Removal  times three, Carpal Tunnel , Flexor tendon Synovectomy;  Surgeon: [name], MD;  Location[facility];  Service: Orthopedics;  Laterality: Left;  . I&D EXTREMITY Left 12/06/2012   Procedure: IRRIGATION AND DEBRIDEMENT EXTREMITY LEFT HAND;  Surgeon: [name], MD;  [facility] OR;  Service: Orthopedics;  Laterality: Left;  . NO PAST SURGERIES    . TENDON REPAIR Left 12/23/2012   Procedure: DIVISION AND INSETTING FLAP TO LEFT RING FINGER ADJACENT TISSUE REARRANGEMENT LEFT INDEX FINGER AMPUTATION SITE;  Surgeon: [name], MD;  Location: [facility];  Service: Orthopedics;  Laterality: Left;   Social History   Occupational History  . Not on file.   Social History Main Topics  . Smoking status: Never Smoker  . Smokeless tobacco: Never Used  . Alcohol use 4.8 oz/week    8 Cans of beer per week     Comment: 8-12 beers qd   . Drug use: No  . Sexual activity: Not on file

## 2016-08-21 ENCOUNTER — Encounter (INDEPENDENT_AMBULATORY_CARE_PROVIDER_SITE_OTHER): Payer: Self-pay

## 2016-08-21 ENCOUNTER — Other Ambulatory Visit (INDEPENDENT_AMBULATORY_CARE_PROVIDER_SITE_OTHER): Payer: Self-pay | Admitting: Physician Assistant

## 2016-08-21 ENCOUNTER — Encounter (INDEPENDENT_AMBULATORY_CARE_PROVIDER_SITE_OTHER): Payer: Self-pay | Admitting: Physician Assistant

## 2016-08-21 ENCOUNTER — Ambulatory Visit (INDEPENDENT_AMBULATORY_CARE_PROVIDER_SITE_OTHER): Payer: Medicare Other | Admitting: Physician Assistant

## 2016-08-21 DIAGNOSIS — I96 Gangrene, not elsewhere classified: Secondary | ICD-10-CM

## 2016-08-21 MED ORDER — SULFAMETHOXAZOLE-TRIMETHOPRIM 800-160 MG PO TABS: 1.0000 | ORAL_TABLET | Freq: Two times a day (BID) | ORAL | 0 refills | Status: DC

## 2016-08-21 NOTE — Patient Instructions (Addendum)
Stephen Fitzgerald  08/21/2016   Your procedure is scheduled on: Thursday 08/24/2016  Report to [facility] Main  Entrance take [place]  elevators to 3rd floor to  Short Stay Center at  0830 AM.  Call this number if you have problems the morning of surgery [phone]   Remember: ONLY 1 PERSON MAY GO WITH YOU TO SHORT STAY TO GET  READY MORNING OF YOUR SURGERY.    Do not eat food or drink liquids :After Midnight.    How to Manage Your Diabetes Before and After Surgery  Why is it important to control my blood sugar before and after surgery? . Improving blood sugar levels before and after surgery helps healing and can limit problems. . A way of improving blood sugar control is eating a healthy diet by: o  Eating less sugar and carbohydrates o  Increasing activity/exercise o  Talking with your doctor about reaching your blood sugar goals . High blood sugars (greater than 180 mg/dL) can raise your risk of infections and slow your recovery, so you will need to focus on controlling your diabetes during the weeks before surgery. . Make sure that the doctor who takes care of your diabetes knows about your planned surgery including the date and location.  How do I manage my blood sugar before surgery? . Check your blood sugar at least 4 times a day, starting 2 days before surgery, to make sure that the level is not too high or low. o Check your blood sugar the morning of your surgery when you wake up and every 2 hours until you get to the Short Stay unit. . If your blood sugar is less than 70 mg/dL, you will need to treat for low blood sugar: o Do not take insulin. o Treat a low blood sugar (less than 70 mg/dL) with  cup of clear juice (cranberry or apple), 4 glucose tablets, OR glucose gel. o Recheck blood sugar in 15 minutes after treatment (to make sure it is greater than 70 mg/dL). If your blood sugar is not greater than 70 mg/dL on recheck, call [phone] for  further instructions. . Report your blood sugar to the short stay nurse when you get to Short Stay.  . If you are admitted to the hospital after surgery: o Your blood sugar will be checked by the staff and you will probably be given insulin after surgery (instead of oral diabetes medicines) to make sure you have good blood sugar levels. o The goal for blood sugar control after surgery is 80-180 mg/dL.   WHAT DO I DO ABOUT MY DIABETES MEDICATION?      Day before surgery, take your Glipizide ( Glucotrol) and Metformin (Glucophage XR) as usual!  . Do not take oral diabetes medicines (pills) the morning of surgery.!     Take these medicines the morning of surgery with A SIP OF WATER: Metoprolol, use eye drops as needed                                You may not have any metal on your body including hair pins and              piercings  Do not wear jewelry, make-up, lotions, powders or perfumes, deodorant             Do not  wear nail polish.  Do not shave  48 hours prior to surgery.              Men may shave face and neck.   Do not bring valuables to the hospital.  IS NOT             RESPONSIBLE   FOR VALUABLES.  Contacts, dentures or bridgework may not be worn into surgery.  Leave suitcase in the car. After surgery it may be brought to your room.     Patients discharged the day of surgery will not be allowed to drive home.  Name and phone number of your driver:father- [name]  Special Instructions: N/A              Please read over the following fact sheets you were given: _____________________________________________________________________             [facility] Pre[facility] Before surgery, you can play an important role.  Because skin is not sterile, your skin needs to be as free of germs as possible.  You can reduce the number of germs on your skin by washing with CHG (chlorahexidine gluconate) soap before surgery.  CHG is an antiseptic cleaner  which kills germs and bonds with the skin to continue killing germs even after washing. Please DO NOT use if you have an allergy to CHG or antibacterial soaps.  If your skin becomes reddened/irritated stop using the CHG and inform your nurse when you arrive at Short Stay. Do not shave (including legs and underarms) for at least 48 hours prior to the first CHG shower.  You may shave your face/neck. Please follow these instructions carefully:  1.  Shower with CHG Soap the night before surgery and the  morning of Surgery.  2.  If you choose to wash your hair, wash your hair first as usual with your  normal  shampoo.  3.  After you shampoo, rinse your hair and body thoroughly to remove the  shampoo.                           4.  Use CHG as you would any other liquid soap.  You can apply chg directly  to the skin and wash                       Gently with a scrungie or clean washcloth.  5.  Apply the CHG Soap to your body ONLY FROM THE NECK DOWN.   Do not use on face/ open                           Wound or open sores. Avoid contact with eyes, ears mouth and genitals (private parts).                       Wash face,  Genitals (private parts) with your normal soap.             6.  Wash thoroughly, paying special attention to the area where your surgery  will be performed.  7.  Thoroughly rinse your body with warm water from the neck down.  8.  DO NOT shower/wash with your normal soap after using and rinsing off  the CHG Soap.                9.  Dennie BiblePat  yourself dry with a clean towel.            10.  Wear clean pajamas.            11.  Place clean sheets on your bed the night of your first shower and do not  sleep with pets. Day of Surgery : Do not apply any lotions/deodorants the morning of surgery.  Please wear clean clothes to the hospital/surgery center.  FAILURE TO FOLLOW THESE INSTRUCTIONS MAY RESULT IN THE CANCELLATION OF YOUR SURGERY PATIENT SIGNATURE_________________________________  NURSE  SIGNATURE__________________________________  ________________________________________________________________________   [name]  An incentive spirometer is a tool that can help keep your lungs clear and active. This tool measures how well you are filling your lungs with each breath. Taking long deep breaths may help reverse or decrease the chance of developing breathing (pulmonary) problems (especially infection) following:  A long period of time when you are unable to move or be active. BEFORE THE PROCEDURE   If the spirometer includes an indicator to show your best effort, your nurse or respiratory therapist will set it to a desired goal.  If possible, sit up straight or lean slightly forward. Try not to slouch.  Hold the incentive spirometer in an upright position. INSTRUCTIONS FOR USE  1. Sit on the edge of your bed if possible, or sit up as far as you can in bed or on a chair. 2. Hold the incentive spirometer in an upright position. 3. Breathe out normally. 4. Place the mouthpiece in your mouth and seal your lips tightly around it. 5. Breathe in slowly and as deeply as possible, raising the piston or the ball toward the top of the column. 6. Hold your breath for 3-5 seconds or for as long as possible. Allow the piston or ball to fall to the bottom of the column. 7. Remove the mouthpiece from your mouth and breathe out normally. 8. Rest for a few seconds and repeat Steps 1 through 7 at least 10 times every 1-2 hours when you are awake. Take your time and take a few normal breaths between deep breaths. 9. The spirometer may include an indicator to show your best effort. Use the indicator as a goal to work toward during each repetition. 10. After each set of 10 deep breaths, practice coughing to be sure your lungs are clear. If you have an incision (the cut made at the time of surgery), support your incision when coughing by placing a pillow or rolled up towels firmly  against it. Once you are able to get out of bed, walk around indoors and cough well. You may stop using the incentive spirometer when instructed by your caregiver.  RISKS AND COMPLICATIONS  Take your time so you do not get dizzy or light-headed.  If you are in pain, you may need to take or ask for pain medication before doing incentive spirometry. It is harder to take a deep breath if you are having pain. AFTER USE  Rest and breathe slowly and easily.  It can be helpful to keep track of a log of your progress. Your caregiver can provide you with a simple table to help with this. If you are using the spirometer at home, follow these instructions: [place] IF:   You are having difficultly using the spirometer.  You have trouble using the spirometer as often as instructed.  Your pain medication is not giving enough relief while using the spirometer.  You develop fever of  100.5 F (38.1 C) or higher. SEEK IMMEDIATE MEDICAL CARE IF:   You cough up bloody sputum that had not been present before.  You develop fever of 102 F (38.9 C) or greater.  You develop worsening pain at or near the incision site. MAKE SURE YOU:   Understand these instructions.  Will watch your condition.  Will get help right away if you are not doing well or get worse. Document Released: 11/13/2006 Document Revised: 09/25/2011 Document Reviewed: 01/14/2007 [facility] Patient Information 2014 [place], [place].   ________________________________________________________________________

## 2016-08-21 NOTE — Progress Notes (Signed)
Office Visit Note   Patient: Stephen Fitzgerald           Date of Birth: Mar 01, 1961           MRN: [id number] Visit Date: 08/21/2016              Requested by: Bonney Aid, MD [address] [place], [place] [address] PCP: Velora Heckler, MD   Assessment & Plan: Visit Diagnoses:  1. Gangrene of toe of left foot (HCC)     Plan: Amputation of left great toe/first ray amputation Thursday. We will keep him on the Bactrim DS until then due to cellulitis of the foot and lower leg. He is seeing his primary care physician on Wednesday to check his glucose levels. We discussed with him the importance of keeping his glucose under as tight as control as possible.He'll keep a dry dressing on the wound and changing it daily until we see him on Thursday. He develops fevers chills nausea or vomiting. Emergency room immediately. We'll see him back 1 week postop.  Follow-Up Instructions: Return in about 2 weeks (around 09/04/2016).   Orders:  No orders of the defined types were placed in this encounter.  Meds ordered this encounter  Medications  . sulfamethoxazole-trimethoprim (BACTRIM DS,SEPTRA DS) 800-160 MG tablet    Sig: Take 1 tablet by mouth 2 (two) times daily.    Dispense:  10 tablet    Refill:  0      Procedures: No procedures performed   Clinical Data: No additional findings.   Subjective: Chief Complaint  Patient presents with  . Left Foot - Follow-up  . Left Grt Toe    HPI Stephen Fitzgerald returns today with his dad follow-up of his left great toe. He states that on the bottom takes that the soreness in his leg and foot went away. He had no adverse effects to the Bactrim DS. Reports when finishing up with the antibiotics that the toe turned completely black. He denies any fevers chills nausea or vomiting. Review of Systems   Objective: Vital Signs: There were no vitals taken for this visit.  Physical Exam  Constitutional: He is oriented to person, place, and time. He appears  well-developed and well-nourished. No distress.  Cardiovascular: Intact distal pulses.   Neurological: He is alert and oriented to person, place, and time.    Ortho Exam Cellulitis left lower leg is improved but still present. Great toe with dry gangrene and malodor. None of the lesser toes appear to be involved at this point. Specialty Comments:  No specialty comments available.  Imaging: No results found.   PMFS History: Patient Active Problem List   Diagnosis Date Noted  . Gangrene of toe of left foot (HCC) 08/21/2016  . Osteomyelitis of toe of right foot (HCC) 12/10/2015  . Healthcare maintenance 04/27/2015  . Foot swelling 12/16/2014  . Amputation of left hand ring finger 09/23/2014  . 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 History of osteomyelitis 08/26/2014  . Serum albumin decreased 08/26/2014  . Vitamin B12 deficiency 08/18/2014  . Heart murmur, systolic   . Heart murmur 07/24/2013  . Hyperlipidemia 03/06/2013  . Hypertension 03/06/2013  . Tachycardia 01/13/2013  . Diabetes mellitus type 2 with complications, uncontrolled (HCC) 12/08/2012  . Expressive aphasia syndrome 12/08/2012  . 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 Abscess of hand, left 12/08/2012   left hand with only 3rd to 5th finger, 4th finger to  midjoint.- reddened but dry and intact scar line.  . Bacteremia   . Blood poisoning (HCC)   . Diabetes mellitus without complication (HCC)    not usually checking blood sugars daily.  . Flexor tenosynovitis of finger 12/08/2012  . Hypertension   . Stroke [facility])    expressive aphasia  . Toe osteomyelitis, right (HCC)    right second toe    No family history on file.  Past Surgical History:  Procedure Laterality Date  . ABDOMINAL AORTAGRAM N/A 08/13/2014   Procedure: ABDOMINAL Ronny FlurryAORTAGRAM;  Surgeon: Fransisco HertzBrian L Chen, MD;  [facility];  Service: Cardiovascular;  Laterality: N/A;  . AMPUTATION  Left 12/06/2012   Procedure: AMPUTATION DIGIT LEFT INDEX;  Surgeon: Jodi Marbleavid A Thompson, MD;  [facility];  Service: Orthopedics;  Laterality: Left;  . AMPUTATION Left 08/15/2014   Procedure: AMPUTATION LEFT RING FINGER;  Surgeon: Kathryne Hitchhristopher Y Blackman, MD;  Location: [facility] OR;  Service: Orthopedics;  Laterality: Left;  . AMPUTATION Right 08/15/2014   Procedure: AMPUTATION RIGHT FOOT FIRST RAY AMPUTATION AND IRRIGATION AND DEBREDMENT OF RIGHT FOOT ABSCESS;  Surgeon: Kathryne Hitchhristopher Y Blackman, MD;  Location: [facility] OR;  Service: Orthopedics;  Laterality: Right;  . AMPUTATION Right 12/10/2015   Procedure: AMPUTATION RIGHT 2ND RAY;  Surgeon: Kathryne Hitchhristopher Y Blackman, MD;  Location: [facility] ORS;  Service: Orthopedics;  Laterality: Right;  . HERNIA REPAIR     bilat ing age 18months  . I&D EXTREMITY Left 12/04/2012   Procedure: IRRIGATION AND DEBRIDEMENT Left Hand with Ring Removal  times three, Carpal Tunnel , Flexor tendon Synovectomy;  Surgeon: Jodi Marbleavid A Thompson, MD;  [facility];  Service: Orthopedics;  Laterality: Left;  . I&D EXTREMITY Left 12/06/2012   Procedure: IRRIGATION AND DEBRIDEMENT EXTREMITY LEFT HAND;  Surgeon: Jodi Marbleavid A Thompson, MD;  [facility];  Service: Orthopedics;  Laterality: Left;  . NO PAST SURGERIES    . TENDON REPAIR Left 12/23/2012   Procedure: DIVISION AND INSETTING FLAP TO LEFT RING FINGER ADJACENT TISSUE REARRANGEMENT LEFT INDEX FINGER AMPUTATION SITE;  Surgeon: Jodi Marbleavid A Thompson, MD;  Location: [facility];  Service: Orthopedics;  Laterality: Left;   Social History   Occupational History  . Not on file.   Social History Main Topics  . Smoking status: Never Smoker  . Smokeless tobacco: Never Used  . Alcohol use 4.8 oz/week    8 Cans of beer per week     Comment: 8-12 beers qd   . Drug use: No  . Sexual activity: Not on file

## 2016-08-21 NOTE — Progress Notes (Signed)
12/08/2015-noted in EPIC- EKG

## 2016-08-21 NOTE — Progress Notes (Signed)
[facility] has a Pre[facility] on Wednesday 08/23/2016 at [facility]. Thank you!

## 2016-08-21 NOTE — Progress Notes (Signed)
[facility] on 2/8.  preop on 08/23/16.   Thank You.

## 2016-08-23 ENCOUNTER — Other Ambulatory Visit (INDEPENDENT_AMBULATORY_CARE_PROVIDER_SITE_OTHER): Payer: Self-pay | Admitting: Orthopaedic Surgery

## 2016-08-23 ENCOUNTER — Telehealth (INDEPENDENT_AMBULATORY_CARE_PROVIDER_SITE_OTHER): Payer: Self-pay | Admitting: *Deleted

## 2016-08-23 ENCOUNTER — Encounter (HOSPITAL_COMMUNITY)
Admission: RE | Admit: 2016-08-23 | Discharge: 2016-08-23 | Disposition: A | Discharge status: Home / Self Care | Payer: Medicare Other | Source: Ambulatory Visit | Attending: Orthopaedic Surgery | Admitting: Orthopaedic Surgery

## 2016-08-23 ENCOUNTER — Encounter (HOSPITAL_COMMUNITY): Payer: Self-pay

## 2016-08-23 ENCOUNTER — Ambulatory Visit (INDEPENDENT_AMBULATORY_CARE_PROVIDER_SITE_OTHER): Payer: Medicare Other | Admitting: Student

## 2016-08-23 ENCOUNTER — Encounter: Payer: Self-pay | Admitting: Student

## 2016-08-23 VITALS — BP 128/72 | HR 115 | Temp 99.2°F | Wt 222.0 lb

## 2016-08-23 DIAGNOSIS — I10 Essential (primary) hypertension: Secondary | ICD-10-CM | POA: Diagnosis not present

## 2016-08-23 DIAGNOSIS — Z7984 Long term (current) use of oral hypoglycemic drugs: Secondary | ICD-10-CM | POA: Diagnosis not present

## 2016-08-23 DIAGNOSIS — E1165 Type 2 diabetes mellitus with hyperglycemia: Secondary | ICD-10-CM | POA: Diagnosis not present

## 2016-08-23 DIAGNOSIS — I350 Nonrheumatic aortic (valve) stenosis: Secondary | ICD-10-CM | POA: Diagnosis not present

## 2016-08-23 DIAGNOSIS — Z6830 Body mass index (BMI) 30.0-30.9, adult: Secondary | ICD-10-CM | POA: Diagnosis not present

## 2016-08-23 DIAGNOSIS — E118 Type 2 diabetes mellitus with unspecified complications: Secondary | ICD-10-CM

## 2016-08-23 DIAGNOSIS — M879 Osteonecrosis, unspecified: Secondary | ICD-10-CM | POA: Diagnosis present

## 2016-08-23 DIAGNOSIS — Z8673 Personal history of transient ischemic attack (TIA), and cerebral infarction without residual deficits: Secondary | ICD-10-CM | POA: Diagnosis not present

## 2016-08-23 DIAGNOSIS — M869 Osteomyelitis, unspecified: Secondary | ICD-10-CM | POA: Diagnosis not present

## 2016-08-23 DIAGNOSIS — Z79899 Other long term (current) drug therapy: Secondary | ICD-10-CM | POA: Diagnosis not present

## 2016-08-23 DIAGNOSIS — E1169 Type 2 diabetes mellitus with other specified complication: Secondary | ICD-10-CM | POA: Diagnosis not present

## 2016-08-23 DIAGNOSIS — Z7982 Long term (current) use of aspirin: Secondary | ICD-10-CM | POA: Diagnosis not present

## 2016-08-23 DIAGNOSIS — Z89431 Acquired absence of right foot: Secondary | ICD-10-CM | POA: Diagnosis not present

## 2016-08-23 DIAGNOSIS — Z89022 Acquired absence of left finger(s): Secondary | ICD-10-CM | POA: Diagnosis not present

## 2016-08-23 LAB — BASIC METABOLIC PANEL
Anion gap: 6 (ref 5–15)
BUN: 17 mg/dL (ref 6–20)
CHLORIDE: 109 mmol/L (ref 101–111)
CO2: 21 mmol/L — AB (ref 22–32)
Calcium: 8.9 mg/dL (ref 8.9–10.3)
Creatinine, Ser: 1.07 mg/dL (ref 0.61–1.24)
GFR calc non Af Amer: >60 mL/min (ref >60)
GLUCOSE: 347 mg/dL — AB (ref 65–99)
Potassium: 6.3 mmol/L (ref 3.5–5.1)
Sodium: 136 mmol/L (ref 135–145)

## 2016-08-23 LAB — CBC
HCT: 34.8 % — ABNORMAL LOW (ref 39.0–52.0)
HEMOGLOBIN: 11.2 g/dL — AB (ref 13.0–17.0)
MCH: 28.8 pg (ref 26.0–34.0)
MCHC: 32.2 g/dL (ref 30.0–36.0)
MCV: 89.5 fL (ref 78.0–100.0)
Platelets: 361 10*3/uL (ref 150–400)
RBC: 3.89 MIL/uL — AB (ref 4.22–5.81)
RDW: 13.4 % (ref 11.5–15.5)
WBC: 12.5 10*3/uL — ABNORMAL HIGH (ref 4.0–10.5)

## 2016-08-23 LAB — GLUCOSE, CAPILLARY: Glucose-Capillary: 259 mg/dL — ABNORMAL HIGH (ref 65–99)

## 2016-08-23 MED ORDER — METOPROLOL SUCCINATE ER 50 MG PO TB24: 50.0000 mg | ORAL_TABLET | Freq: Every day | ORAL | 3 refills | Status: DC

## 2016-08-23 MED ORDER — SODIUM POLYSTYRENE SULFONATE PO POWD: Freq: Once | ORAL | 0 refills | Status: AC

## 2016-08-23 NOTE — Telephone Encounter (Signed)
Checking if OTC is ok, it is per CB

## 2016-08-23 NOTE — Assessment & Plan Note (Signed)
Will have amputation of left great toe tomorrow - will follow after surgery and at that time will again discuss adjusting medications of he will allow it. Today he is adamant that he like his medications as is

## 2016-08-23 NOTE — Patient Instructions (Signed)
Follow up after your foot surgery to discuss diabetes If you have questions or concerns, call the office at [phone]

## 2016-08-23 NOTE — Telephone Encounter (Signed)
Pharmacy is calling for clarification on kayexalate order. CB: [phone]

## 2016-08-23 NOTE — Assessment & Plan Note (Addendum)
Stable - continue toprol - follow BMP

## 2016-08-23 NOTE — Progress Notes (Signed)
   Subjective:    Patient ID: Stephen Fitzgerald, male    DOB: [birth year], 56 y.o.   MRN: [id number]   CC:  Medication refill  HPI: 56 y/o M with T2 DM and HTN presents for medication refill  T2DM - was recently found to yhave a gangrenous left great toe and orthopedics plans to amputate it tomorrow - he is on metformin and glipizide - hgb A1c was done wit hhis preop labs today  HTN - reports compliance with toprol - denies chest pain, HTN  Social - Of not his father who is a significant care taker for him was recently diagnosed with bladder cancer and is currently undergoing chemotherapy  Smoking status reviewed  Review of Systems  Per HPI, else denies recent illness, fever, chest pain, shortness of breath, abdominal pain    Objective:  BP 128/72   Pulse (!) 115   Temp 99.2 F (37.3 C) (Oral)   Wt 222 lb (100.7 kg)   SpO2 98%   BMI 30.53 kg/m  Vitals and nursing note reviewed  General: NAD Cardiac: RRR,  Respiratory: CTAB, normal effort Extremities:foot exam deferred as he has been recently evaluated by orthopedics and his left foot is wrapped Skin: warm and dry, no rashes noted Neuro: alert and oriented   Assessment & Plan:    Hypertension Stable - continue toprol - follow BMP  Diabetes mellitus type 2 with complications, uncontrolled Will have amputation of left great toe tomorrow - will follow after surgery and at that time will again discuss adjusting medications of he will allow it. Today he is adamant that he like his medications as is  Bladder Cancer in father Will follow social situation and assist as needed  Shadana Pry A. Kennon RoundsHaney MD, MS Family Medicine Resident PGY-3 Pager [phone]

## 2016-08-23 NOTE — Progress Notes (Signed)
CRITICAL VALUE ALERT  Critical value received:  Potassium- 6.3   from Augusto Gambleammy Mullins, MLT in lab  Date of notification:  08/23/2016  Time of notification:  1353  Critical value read back:yes  Nurse who received alert:  Telford NabBeverly M. Cathlean SauerNanney, BSN,RN  MD notified (1st page):  Dr. Allie Bossierhris Blackman  Time of first page:  1358  MD notified (2nd page):  Time of second page:  Responding MD:  Dr. Allie Bossierhris Blackman  Time MD responded:  1358. Called Dr. Allie Bossierhris Blackman on his cell phone and gave him the Potassium results. No new orders received.

## 2016-08-24 ENCOUNTER — Encounter (HOSPITAL_COMMUNITY): Payer: Self-pay

## 2016-08-24 ENCOUNTER — Ambulatory Visit (HOSPITAL_COMMUNITY)
Admission: RE | Admit: 2016-08-24 | Discharge: 2016-08-24 | Disposition: A | Discharge status: Home / Self Care | Payer: Medicare Other | Source: Ambulatory Visit | Attending: Orthopaedic Surgery | Admitting: Orthopaedic Surgery

## 2016-08-24 ENCOUNTER — Telehealth (INDEPENDENT_AMBULATORY_CARE_PROVIDER_SITE_OTHER): Payer: Self-pay | Admitting: Physician Assistant

## 2016-08-24 ENCOUNTER — Ambulatory Visit (INDEPENDENT_AMBULATORY_CARE_PROVIDER_SITE_OTHER): Payer: Medicare Other | Admitting: Physician Assistant

## 2016-08-24 ENCOUNTER — Ambulatory Visit (HOSPITAL_COMMUNITY): Payer: Medicare Other | Admitting: Anesthesiology

## 2016-08-24 ENCOUNTER — Encounter (HOSPITAL_COMMUNITY)
Admission: RE | Disposition: A | Discharge status: Home / Self Care | Payer: Self-pay | Source: Ambulatory Visit | Attending: Orthopaedic Surgery

## 2016-08-24 DIAGNOSIS — Z7982 Long term (current) use of aspirin: Secondary | ICD-10-CM | POA: Diagnosis not present

## 2016-08-24 DIAGNOSIS — Z8673 Personal history of transient ischemic attack (TIA), and cerebral infarction without residual deficits: Secondary | ICD-10-CM | POA: Insufficient documentation

## 2016-08-24 DIAGNOSIS — Z89022 Acquired absence of left finger(s): Secondary | ICD-10-CM | POA: Insufficient documentation

## 2016-08-24 DIAGNOSIS — E1169 Type 2 diabetes mellitus with other specified complication: Secondary | ICD-10-CM | POA: Insufficient documentation

## 2016-08-24 DIAGNOSIS — L97524 Non-pressure chronic ulcer of other part of left foot with necrosis of bone: Secondary | ICD-10-CM | POA: Diagnosis not present

## 2016-08-24 DIAGNOSIS — Z79899 Other long term (current) drug therapy: Secondary | ICD-10-CM | POA: Diagnosis not present

## 2016-08-24 DIAGNOSIS — E1165 Type 2 diabetes mellitus with hyperglycemia: Secondary | ICD-10-CM | POA: Diagnosis not present

## 2016-08-24 DIAGNOSIS — I10 Essential (primary) hypertension: Secondary | ICD-10-CM | POA: Insufficient documentation

## 2016-08-24 DIAGNOSIS — Z89431 Acquired absence of right foot: Secondary | ICD-10-CM | POA: Insufficient documentation

## 2016-08-24 DIAGNOSIS — M869 Osteomyelitis, unspecified: Secondary | ICD-10-CM | POA: Insufficient documentation

## 2016-08-24 DIAGNOSIS — Z7984 Long term (current) use of oral hypoglycemic drugs: Secondary | ICD-10-CM | POA: Insufficient documentation

## 2016-08-24 DIAGNOSIS — R000 Tachycardia, unspecified: Secondary | ICD-10-CM | POA: Diagnosis not present

## 2016-08-24 DIAGNOSIS — I350 Nonrheumatic aortic (valve) stenosis: Secondary | ICD-10-CM | POA: Diagnosis not present

## 2016-08-24 DIAGNOSIS — E1152 Type 2 diabetes mellitus with diabetic peripheral angiopathy with gangrene: Secondary | ICD-10-CM | POA: Diagnosis not present

## 2016-08-24 DIAGNOSIS — E785 Hyperlipidemia, unspecified: Secondary | ICD-10-CM | POA: Diagnosis not present

## 2016-08-24 DIAGNOSIS — Z6830 Body mass index (BMI) 30.0-30.9, adult: Secondary | ICD-10-CM | POA: Insufficient documentation

## 2016-08-24 HISTORY — PX: AMPUTATION: SHX166

## 2016-08-24 LAB — GLUCOSE, CAPILLARY
GLUCOSE-CAPILLARY: 109 mg/dL — AB (ref 65–99)
Glucose-Capillary: 135 mg/dL — ABNORMAL HIGH (ref 65–99)

## 2016-08-24 LAB — BASIC METABOLIC PANEL
Anion gap: 7 (ref 5–15)
BUN: 15 mg/dL (ref 6–20)
CALCIUM: 9 mg/dL (ref 8.9–10.3)
CHLORIDE: 110 mmol/L (ref 101–111)
CO2: 23 mmol/L (ref 22–32)
CREATININE: 1.08 mg/dL (ref 0.61–1.24)
Glucose, Bld: 127 mg/dL — ABNORMAL HIGH (ref 65–99)
Potassium: 4.8 mmol/L (ref 3.5–5.1)
SODIUM: 140 mmol/L (ref 135–145)

## 2016-08-24 LAB — HEMOGLOBIN A1C
HEMOGLOBIN A1C: 7 % — AB (ref 4.8–5.6)
MEAN PLASMA GLUCOSE: 154 mg/dL

## 2016-08-24 SURGERY — AMPUTATION, FOOT, RAY
Anesthesia: General | Site: Foot | Laterality: Left

## 2016-08-24 MED ORDER — CHLORHEXIDINE GLUCONATE 4 % EX LIQD: 60.0000 mL | Freq: Once | CUTANEOUS | Status: DC

## 2016-08-24 MED ORDER — ONDANSETRON HCL 4 MG/2ML IJ SOLN
INTRAMUSCULAR | Status: DC | PRN
  Administered 2016-08-24: 4 mg via INTRAVENOUS

## 2016-08-24 MED ORDER — FENTANYL CITRATE (PF) 100 MCG/2ML IJ SOLN
INTRAMUSCULAR | Status: DC | PRN
  Administered 2016-08-24: 50 ug via INTRAVENOUS

## 2016-08-24 MED ORDER — SODIUM CHLORIDE 0.9 % IR SOLN
Status: DC | PRN
  Administered 2016-08-24: 1000 mL

## 2016-08-24 MED ORDER — FENTANYL CITRATE (PF) 100 MCG/2ML IJ SOLN
INTRAMUSCULAR | Status: AC
  Filled 2016-08-24: qty 2

## 2016-08-24 MED ORDER — HYDROCODONE-ACETAMINOPHEN 5-325 MG PO TABS
1.0000 | ORAL_TABLET | Freq: Four times a day (QID) | ORAL | Status: DC | PRN
  Administered 2016-08-24: 1 via ORAL
  Filled 2016-08-24: qty 1

## 2016-08-24 MED ORDER — FENTANYL CITRATE (PF) 100 MCG/2ML IJ SOLN
25.0000 ug | INTRAMUSCULAR | Status: DC | PRN
  Administered 2016-08-24 (×2): 25 ug via INTRAVENOUS

## 2016-08-24 MED ORDER — LIDOCAINE 2% (20 MG/ML) 5 ML SYRINGE
INTRAMUSCULAR | Status: DC | PRN
  Administered 2016-08-24: 50 mg via INTRAVENOUS

## 2016-08-24 MED ORDER — PROPOFOL 10 MG/ML IV BOLUS
INTRAVENOUS | Status: DC | PRN
Start: 2016-08-24 — End: 2016-08-24
  Administered 2016-08-24: 200 mg via INTRAVENOUS

## 2016-08-24 MED ORDER — LACTATED RINGERS IV SOLN
INTRAVENOUS | Status: DC
  Administered 2016-08-24: 10:00:00 via INTRAVENOUS

## 2016-08-24 MED ORDER — CLINDAMYCIN PHOSPHATE 900 MG/50ML IV SOLN
INTRAVENOUS | Status: AC
  Filled 2016-08-24: qty 50

## 2016-08-24 MED ORDER — CLINDAMYCIN PHOSPHATE 900 MG/50ML IV SOLN
900.0000 mg | INTRAVENOUS | Status: AC
  Administered 2016-08-24: 900 mg via INTRAVENOUS

## 2016-08-24 MED ORDER — OXYCODONE HCL 5 MG/5ML PO SOLN: 5.0000 mg | Freq: Once | ORAL | Status: DC | PRN

## 2016-08-24 MED ORDER — MIDAZOLAM HCL 2 MG/2ML IJ SOLN
INTRAMUSCULAR | Status: AC
  Filled 2016-08-24: qty 2

## 2016-08-24 MED ORDER — HYDROCODONE-ACETAMINOPHEN 5-325 MG PO TABS: 1.0000 | ORAL_TABLET | Freq: Four times a day (QID) | ORAL | 0 refills | Status: DC | PRN

## 2016-08-24 MED ORDER — MIDAZOLAM HCL 5 MG/5ML IJ SOLN
INTRAMUSCULAR | Status: DC | PRN
  Administered 2016-08-24: 2 mg via INTRAVENOUS

## 2016-08-24 MED ORDER — PROPOFOL 10 MG/ML IV BOLUS
INTRAVENOUS | Status: AC
  Filled 2016-08-24: qty 20

## 2016-08-24 MED ORDER — OXYCODONE HCL 5 MG PO TABS: 5.0000 mg | ORAL_TABLET | Freq: Once | ORAL | Status: DC | PRN

## 2016-08-24 SURGICAL SUPPLY — 34 items
BAG ZIPLOCK 12X15 (MISCELLANEOUS) ×3 IMPLANT
BANDAGE ACE 4X5 VEL STRL LF (GAUZE/BANDAGES/DRESSINGS) ×3 IMPLANT
BANDAGE ACE 6X5 VEL STRL LF (GAUZE/BANDAGES/DRESSINGS) ×3 IMPLANT
BANDAGE ESMARK 6X9 LF (GAUZE/BANDAGES/DRESSINGS) ×1 IMPLANT
BNDG COHESIVE 4X5 TAN STRL (GAUZE/BANDAGES/DRESSINGS) ×3 IMPLANT
BNDG ESMARK 6X9 LF (GAUZE/BANDAGES/DRESSINGS) ×3
BNDG GAUZE ELAST 4 BULKY (GAUZE/BANDAGES/DRESSINGS) ×3 IMPLANT
CUFF TOURN SGL QUICK 34 (TOURNIQUET CUFF) ×2
CUFF TRNQT CYL 34X4X40X1 (TOURNIQUET CUFF) ×1 IMPLANT
DRAPE U-SHAPE 47X51 STRL (DRAPES) ×3 IMPLANT
DRSG KUZMA FLUFF (GAUZE/BANDAGES/DRESSINGS) ×3 IMPLANT
DURAPREP 26ML APPLICATOR (WOUND CARE) ×3 IMPLANT
ELECT REM PT RETURN 9FT ADLT (ELECTROSURGICAL) ×3
ELECTRODE REM PT RTRN 9FT ADLT (ELECTROSURGICAL) ×1 IMPLANT
GAUZE SPONGE 4X4 12PLY STRL (GAUZE/BANDAGES/DRESSINGS) ×3 IMPLANT
GAUZE XEROFORM 5X9 LF (GAUZE/BANDAGES/DRESSINGS) ×3 IMPLANT
GLOVE BIOGEL PI IND STRL 8 (GLOVE) ×4 IMPLANT
GLOVE BIOGEL PI INDICATOR 8 (GLOVE) ×8
GLOVE ECLIPSE 8.0 STRL XLNG CF (GLOVE) ×3 IMPLANT
GLOVE ORTHO TXT STRL SZ7.5 (GLOVE) ×3 IMPLANT
GOWN STRL REUS W/TWL XL LVL3 (GOWN DISPOSABLE) ×9 IMPLANT
KIT BASIN OR (CUSTOM PROCEDURE TRAY) ×3 IMPLANT
NS IRRIG 1000ML POUR BTL (IV SOLUTION) ×3 IMPLANT
PACK TOTAL JOINT (CUSTOM PROCEDURE TRAY) ×3 IMPLANT
PAD ABD 8X10 STRL (GAUZE/BANDAGES/DRESSINGS) ×3 IMPLANT
PAD CAST 4YDX4 CTTN HI CHSV (CAST SUPPLIES) ×1 IMPLANT
PADDING CAST COTTON 4X4 STRL (CAST SUPPLIES) ×2
POSITIONER SURGICAL ARM (MISCELLANEOUS) ×3 IMPLANT
SPONGE LAP 18X18 X RAY DECT (DISPOSABLE) ×6 IMPLANT
STAPLER VISISTAT 35W (STAPLE) ×3 IMPLANT
STOCKINETTE 8 INCH (MISCELLANEOUS) ×3 IMPLANT
SUT ETHILON 2 0 PSLX (SUTURE) ×6 IMPLANT
TOWEL OR 17X26 10 PK STRL BLUE (TOWEL DISPOSABLE) ×9 IMPLANT
WATER STERILE IRR 1500ML POUR (IV SOLUTION) ×3 IMPLANT

## 2016-08-24 NOTE — Op Note (Signed)
[name]:  Hoog[place], Dickson           ACCOUNT NO.:  [id number]  MEDICAL RECORD NO.:  [id number]  LOCATION:                                 FACILITY:  PHYSICIAN:  Vanita PandaChristopher Y. Magnus IvanBlackman, M.D.DATE OF BIRTH:  [birth year]  DATE OF PROCEDURE:  08/24/2016 DATE OF DISCHARGE:                              OPERATIVE REPORT   PREOPERATIVE DIAGNOSES:  Left great toe with necrosis, infection, osteomyelitis and a poorly-controlled diabetic.  POSTOPERATIVE DIAGNOSES:  Left great toe with necrosis, infection, osteomyelitis and a poorly-controlled diabetic.  PROCEDURE:  Left great toe amputation through metatarsophalangeal joint.  SURGEON:  Vanita PandaChristopher Y. Magnus IvanBlackman, M.D.  ASSISTANT:  Richardean CanalGilbert Clark, PA-C.  ANESTHESIA:  General.  BLOOD LOSS:  Minimal.  COMPLICATIONS:  None.  INDICATIONS:  Stephen Fitzgerald is a 56 year old gentleman who is a diabetic, well known to me.  He has soft tissue necrosis of his great toe.  There was a dry gangrene going on for a while, but now it is involving the bone.  He has x-rays showing osteomyelitis of the great toe and a drainage from it, now this become purulent and is malodorous. At this point, we have recommended a great toe amputation through the MTP joint and was still counseled about good blood glucose control.  The risks and benefits of the surgery were explained to him and his dad, and he did wish to proceed with surgery.  PROCEDURE DESCRIPTION:  After informed consent was obtained, appropriate left foot was marked.  He was brought to the operating room and placed supine on the operating table.  General anesthesia was then obtained. His left foot was prepped and draped with Betadine and sterile drapes were applied.  A time-out was called to identify the correct patient and correct left ankle.  We then used an Esmarch around the ankle as a local tourniquet.  We then used a #10 blade and excised around the great toe and removed in its entirety.  We found it  to be grossly infected.  We then removed the metatarsal head with a rongeur and a bone-cutting forceps and backed this up to good bone.  We used a #10 blade to sharply debride necrotic soft tissue.  So, the debridement was an excisional debridement of skin, soft tissue and bone.  We then irrigated the soft tissue with normal saline solution.  We were able to loosely reapproximate the skin with interrupted nylon suture.  Xeroform and well- padded sterile dressing were applied, and he was awakened, extubated and taken to the recovery room in stable condition.  All final counts were correct.  There were no complications noted.     Vanita Pandahristopher Y. Magnus IvanBlackman, M.D.   ______________________________ Vanita Pandahristopher Y. Magnus IvanBlackman, M.D.    CYB/MEDQ  D:  08/24/2016  T:  08/24/2016  Job:  [id number]

## 2016-08-24 NOTE — Anesthesia Postprocedure Evaluation (Addendum)
Anesthesia Post Note  Patient: Stephen Fitzgerald  Procedure(s) Performed: Procedure(s) (LRB): LEFT GREAT TOE AMPUTATION (Left)  Patient location during evaluation: PACU Anesthesia Type: General Level of consciousness: awake and alert Pain management: pain level controlled Vital Signs Assessment: post-procedure vital signs reviewed and stable Respiratory status: spontaneous breathing, nonlabored ventilation, respiratory function stable and patient connected to nasal cannula oxygen Cardiovascular status: blood pressure returned to baseline and stable Postop Assessment: no signs of nausea or vomiting Anesthetic complications: no       Last Vitals:  Vitals:   08/24/16 1232 08/24/16 1332  BP: 129/79 (!) 143/87  Pulse:  98  Resp: 16 17  Temp: 37 C 36.6 C    Last Pain:  Vitals:   08/24/16 1332  TempSrc: Oral  PainSc: 2                  Ahniya Mitchum

## 2016-08-24 NOTE — Brief Op Note (Signed)
08/24/2016  11:19 AM  PATIENT:  Stephen Fitzgerald  56 y.o. male  PRE-OPERATIVE DIAGNOSIS:  gangrene left great toe  POST-OPERATIVE DIAGNOSIS:  gangrene left great toe  PROCEDURE:  Procedure(s): LEFT GREAT TOE AMPUTATION (Left)  SURGEON:  Surgeon(s) and Role:    * Kathryne Hitchhristopher Y Norris Bodley, MD - Primary  PHYSICIAN ASSISTANT: Rexene EdisonGil Clark, PA-C  ANESTHESIA:   general  EBL:  Total I/O In: -  Out: 10 [Blood:10]  COUNTS:  YES  DICTATION: .Other Dictation: Dictation Number [id number]  PLAN OF CARE: Discharge to home after PACU  PATIENT DISPOSITION:  PACU - hemodynamically stable.   Delay start of Pharmacological VTE agent (>24hrs) due to surgical blood loss or risk of bleeding: no

## 2016-08-24 NOTE — H&P (Signed)
Stephen Fitzgerald is an 56 y.o. male.   Chief Complaint:   Left great toe infection HPI:   56 yo male diagetic with necrosis and infection involving his left great toe that now involves the bone.  He understands fully the recommendation and need for an amputation of the left great toe to clear the infection.  Past Medical History:  Diagnosis Date  . Abscess of hand, left 12/08/2012   left hand with only 3rd to 5th finger, 4th finger to midjoint.- reddened but dry and intact scar line.  . Bacteremia   . Blood poisoning ([place])   . Diabetes mellitus without complication (HCC)    not usually checking blood sugars daily.  . Flexor tenosynovitis of finger 12/08/2012  . Hypertension   . Stroke [facility])    expressive aphasia  . Toe osteomyelitis, right (HCC)    right second toe    Past Surgical History:  Procedure Laterality Date  . ABDOMINAL AORTAGRAM N/A 08/13/2014   Procedure: ABDOMINAL Maxcine Ham;  Surgeon: Conrad South Creek, MD;  Location: [facility] CATH LAB;  Service: Cardiovascular;  Laterality: N/A;  . AMPUTATION Left 12/06/2012   Procedure: AMPUTATION DIGIT LEFT INDEX;  Surgeon: Jolyn Nap, MD;  Location: [place];  Service: Orthopedics;  Laterality: Left;  . AMPUTATION Left 08/15/2014   Procedure: AMPUTATION LEFT RING FINGER;  Surgeon: Mcarthur Rossetti, MD;  Location: [place];  Service: Orthopedics;  Laterality: Left;  . AMPUTATION Right 08/15/2014   Procedure: AMPUTATION RIGHT FOOT FIRST RAY AMPUTATION AND IRRIGATION AND [place] OF RIGHT FOOT ABSCESS;  Surgeon: Mcarthur Rossetti, MD;  Location: [place];  Service: Orthopedics;  Laterality: Right;  . AMPUTATION Right 12/10/2015   Procedure: AMPUTATION RIGHT 2ND RAY;  Surgeon: Mcarthur Rossetti, MD;  Location[facility];  Service: Orthopedics;  Laterality: Right;  . HERNIA REPAIR     bilat ing age 58month  . I&D EXTREMITY Left 12/04/2012   Procedure: IRRIGATION AND DEBRIDEMENT Left Hand with Ring Removal  times three, Carpal Tunnel ,  Flexor tendon Synovectomy;  Surgeon: DJolyn Nap MD;  [place]  Service: Orthopedics;  Laterality: Left;  . I&D EXTREMITY Left 12/06/2012   Procedure: IRRIGATION AND DEBRIDEMENT EXTREMITY LEFT HAND;  Surgeon: DJolyn Nap MD;  [place]  Service: Orthopedics;  Laterality: Left;  . NO PAST SURGERIES    . TENDON REPAIR Left 12/23/2012   Procedure: DIVISION AND INSETTING FLAP TO LEFT RING FINGER ADJACENT TISSUE REARRANGEMENT LEFT INDEX FINGER AMPUTATION SITE;  Surgeon: DJolyn Nap MD;  [place]  Service: Orthopedics;  Laterality: Left;    History reviewed. No pertinent family history. Social History:  reports that he has never smoked. He has never used smokeless tobacco. He reports that he drinks about 4.8 oz of alcohol per week . He reports that he does not use drugs.  Allergies:  Allergies  Allergen Reactions  . Unasyn [Ampicillin-Sulbactam Sodium] Rash    Medications Prior to Admission  Medication Sig Dispense Refill  . metoprolol succinate (TOPROL-XL) 50 MG 24 hr tablet Take 1 tablet (50 mg total) by mouth daily. Take with or immediately following a meal. 90 tablet 3  . aspirin 81 MG tablet Take 1 tablet (81 mg total) by mouth daily. 90 tablet 3  . atorvastatin (LIPITOR) 40 MG tablet TAKE 1 TABLET BY MOUTH DAILY (Patient taking differently: Take 40 mg by mouth daily. ) 90 tablet 3  . clindamycin (CLEOCIN) 300 MG capsule Take 300  mg by mouth 4 (four) times daily.    Marland Kitchen glipiZIDE (GLUCOTROL) 5 MG tablet TAKE 1 TABLET BY MOUTH TWICE DAILY BEFORE MEALS 180 tablet 3  . Ibuprofen (ADVIL) 200 MG CAPS Take 200 mg by mouth daily as needed. Takes Advil liquid gel capsules if needed for back soreness- if needed , takes 2    . metFORMIN (GLUCOPHAGE-XR) 500 MG 24 hr tablet TAKE 4 TABLETS BY MOUTH EVERY DAY WITH BREAKFAST 360 tablet 3  . Naphazoline-Glycerin (CLEAR EYES MAX REDNESS RELIEF OP) Apply 2 drops to eye daily after supper.    . naproxen  sodium (ANAPROX) 220 MG tablet Take 220 mg by mouth daily as needed. Takes Aleve 12 hours- takes 2 for joint pain only!      Results for orders placed or performed during the hospital encounter of 08/24/16 (from the past 48 hour(s))  Glucose, capillary     Status: Abnormal   Collection Time: 08/24/16  8:30 AM  Result Value Ref Range   Glucose-Capillary 109 (H) 65 - 99 mg/dL   Comment 1 Notify RN   Basic metabolic panel     Status: Abnormal   Collection Time: 08/24/16  8:45 AM  Result Value Ref Range   Sodium 140 135 - 145 mmol/L   Potassium 4.8 3.5 - 5.1 mmol/L    Comment: DELTA CHECK NOTED REPEATED TO VERIFY    Chloride 110 101 - 111 mmol/L   CO2 23 22 - 32 mmol/L   Glucose, Bld 127 (H) 65 - 99 mg/dL   BUN 15 6 - 20 mg/dL   Creatinine, Ser 1.08 0.61 - 1.24 mg/dL   Calcium 9.0 8.9 - 10.3 mg/dL   GFR calc non Af Amer >60 >60 mL/min   GFR calc Af Amer >60 >60 mL/min    Comment: (NOTE) The eGFR has been calculated using the CKD EPI equation. This calculation has not been validated in all clinical situations. eGFR's persistently <60 mL/min signify possible Chronic Kidney Disease.    Anion gap 7 5 - 15   No results found.  Review of Systems  All other systems reviewed and are negative.   Blood pressure (!) 147/77, pulse (!) 107, temperature 98.9 F (37.2 C), temperature source Oral, resp. rate 18, height 5' 11.5" (1.816 m), weight 222 lb (100.7 kg), SpO2 99 %. Physical Exam  Constitutional: He is oriented to person, place, and time. He appears well-developed and well-nourished.  HENT:  Head: Normocephalic and atraumatic.  Eyes: EOM are normal. Pupils are equal, round, and reactive to light.  Neck: Normal range of motion. Neck supple.  Cardiovascular: Normal rate and regular rhythm.   Respiratory: Effort normal and breath sounds normal.  GI: Soft. Bowel sounds are normal.  Musculoskeletal:       Feet:  Neurological: He is alert and oriented to person, place, and time.    Skin: Skin is warm and dry.  Psychiatric: He has a normal mood and affect.     Assessment/Plan Left great toe infection 1)  To the OR today for amputation of his left great toe due to overwhelming infection and poorly controlled diabetes.  Mcarthur Rossetti, MD 08/24/2016, 10:42 AM

## 2016-08-24 NOTE — Transfer of Care (Signed)
Immediate Anesthesia Transfer of Care Note  Patient: Stephen Fitzgerald  Procedure(s) Performed: Procedure(s): LEFT GREAT TOE AMPUTATION (Left)  Patient Location: PACU  Anesthesia Type:General  Level of Consciousness: sedated, patient cooperative and responds to stimulation  Airway & Oxygen Therapy: Patient Spontanous Breathing and Patient connected to face mask oxygen  Post-op Assessment: Report given to RN and Post -op Vital signs reviewed and stable  Post vital signs: Reviewed and stable  Last Vitals:  Vitals:   08/24/16 0834  BP: (!) 147/77  Pulse: (!) 107  Resp: 18  Temp: 37.2 C    Last Pain:  Vitals:   08/24/16 0856  TempSrc:   PainSc: 0-No pain         Complications: No apparent anesthesia complications

## 2016-08-24 NOTE — Anesthesia Preprocedure Evaluation (Addendum)
Anesthesia Evaluation  Patient identified by MRN, date of birth, ID band Patient awake    Reviewed: Allergy & Precautions, NPO status , Patient's Chart, lab work & pertinent test results, reviewed documented beta blocker date and time   History of Anesthesia Complications Negative for: history of anesthetic complications  Airway Mallampati: I  TM Distance: >3 FB Neck ROM: Full    Dental  (+) Teeth Intact, Dental Advisory Given   Pulmonary neg pulmonary ROS,    breath sounds clear to auscultation       Cardiovascular hypertension, Pt. on medications and Pt. on home beta blockers + Valvular Problems/Murmurs (Moderate AS) AS  Rhythm:Regular Rate:Normal  Study Conclusions  - Left ventricle: The cavity size was normal. Wall thickness was normal. Systolic function was normal. The estimated ejection fraction was in the range of 55% to 60%. Wall motion was normal; there were no regional wall motion abnormalities. Doppler parameters are consistent with abnormal left ventricular relaxation (grade 1 diastolic dysfunction). - Aortic valve: There was mild stenosis. Valve area (VTI): 1.35 cm^2. Valve area (Vmax): 1.18 cm^2. Valve area (Vmean): 1.16 cm^2.   Neuro/Psych CVA negative psych ROS   GI/Hepatic negative GI ROS, Neg liver ROS,   Endo/Other  diabetes, Well Controlled, Type 2, Oral Hypoglycemic AgentsMorbid obesity  Renal/GU negative Renal ROS     Musculoskeletal   Abdominal   Peds  Hematology   Anesthesia Other Findings   Reproductive/Obstetrics                             Anesthesia Physical Anesthesia Plan  ASA: III  Anesthesia Plan: General   Post-op Pain Management:    Induction: Intravenous  Airway Management Planned: Oral ETT and LMA  Additional Equipment: None  Intra-op Plan:   Post-operative Plan: Extubation in OR  Informed Consent: I have reviewed the  patients History and Physical, chart, labs and discussed the procedure including the risks, benefits and alternatives for the proposed anesthesia with the patient or authorized representative who has indicated his/her understanding and acceptance.   Dental advisory given  Plan Discussed with: CRNA and Surgeon  Anesthesia Plan Comments:        Anesthesia Quick Evaluation

## 2016-08-24 NOTE — Anesthesia Procedure Notes (Signed)
Procedure Name: LMA Insertion Performed by: Aksh Swart J Pre-anesthesia Checklist: Patient identified, Emergency Drugs available, Suction available, Patient being monitored and Timeout performed Patient Re-evaluated:Patient Re-evaluated prior to inductionOxygen Delivery Method: Circle system utilized Preoxygenation: Pre-oxygenation with 100% oxygen Intubation Type: IV induction Ventilation: Mask ventilation without difficulty LMA: LMA inserted LMA Size: 4.0 Number of attempts: 1 Placement Confirmation: positive ETCO2,  CO2 detector and breath sounds checked- equal and bilateral Tube secured with: Tape Dental Injury: Teeth and Oropharynx as per pre-operative assessment        

## 2016-08-24 NOTE — Telephone Encounter (Signed)
Patient's dad calling about the Rx Sulfameth 800.  He was told to finish out but discovered when he went to the pharmacy for another med, he had another sulfameth that the pharmacist filled.  Is he to finish this 7 day supply too?  Dad has concerns over this Rx. Please advise.

## 2016-08-24 NOTE — Discharge Instructions (Signed)
General Anesthesia, Adult, Care After These instructions provide you with information about caring for yourself after your procedure. Your health care provider may also give you more specific instructions. Your treatment has been planned according to current medical practices, but problems sometimes occur. Call your health care provider if you have any problems or questions after your procedure. What can I expect after the procedure? After the procedure, it is common to have:  Vomiting.  A sore throat.  Mental slowness. It is common to feel:  Nauseous.  Cold or shivery.  Sleepy.  Tired.  Sore or achy, even in parts of your body where you did not have surgery. Follow these instructions at home: For at least 24 hours after the procedure:  Do not:  Participate in activities where you could fall or become injured.  Drive.  Use heavy machinery.  Drink alcohol.  Take sleeping pills or medicines that cause drowsiness.  Make important decisions or sign legal documents.  Take care of children on your own.  Rest. Eating and drinking  If you vomit, drink water, juice, or soup when you can drink without vomiting.  Drink enough fluid to keep your urine clear or pale yellow.  Make sure you have little or no nausea before eating solid foods.  Follow the diet recommended by your health care provider. General instructions  Have a responsible adult stay with you until you are awake and alert.  Return to your normal activities as told by your health care provider. Ask your health care provider what activities are safe for you.  Take over-the-counter and prescription medicines only as told by your health care provider.  If you smoke, do not smoke without supervision.  Keep all follow-up visits as told by your health care provider. This is important. Contact a health care provider if:  You continue to have nausea or vomiting at home, and medicines are not helpful.  You  cannot drink fluids or start eating again.  You cannot urinate after 8-12 hours.  You develop a skin rash.  You have fever.  You have increasing redness at the site of your procedure. Get help right away if:  You have difficulty breathing.  You have chest pain.  You have unexpected bleeding.  You feel that you are having a life-threatening or urgent problem. This information is not intended to replace advice given to you by your health care provider. Make sure you discuss any questions you have with your health care provider. Document Released: 10/09/2000 Document Revised: 12/06/2015 Document Reviewed: 06/17/2015 Elsevier Interactive Patient Education  2017 [employer]    Try to stay off of your left foot. Keep your current dressing on and in place. Keep your dressing clean and dry.

## 2016-08-25 NOTE — Telephone Encounter (Signed)
He should have his son take it for the next 7 days as well.

## 2016-08-25 NOTE — Telephone Encounter (Signed)
Please advise 

## 2016-08-25 NOTE — Telephone Encounter (Signed)
LMOM for dad of the below message from [facility]

## 2016-08-31 ENCOUNTER — Ambulatory Visit (INDEPENDENT_AMBULATORY_CARE_PROVIDER_SITE_OTHER): Payer: Medicare Other | Admitting: Physician Assistant

## 2016-08-31 DIAGNOSIS — Z89412 Acquired absence of left great toe: Secondary | ICD-10-CM | POA: Insufficient documentation

## 2016-08-31 MED ORDER — DOXYCYCLINE HYCLATE 100 MG PO TABS: 100.0000 mg | ORAL_TABLET | Freq: Two times a day (BID) | ORAL | 0 refills | Status: AC

## 2016-08-31 NOTE — Progress Notes (Signed)
Office Visit Note   Patient: Stephen SprinklesJeffrey W Fitzgerald           Date of Birth: Mar 04, 1961           MRN: [id number] Visit Date: 08/31/2016              Requested by: Bonney AidAlyssa A Haney, MD [address] [place], [place] [address] PCP: Velora HecklerHaney,Alyssa, MD   Assessment & Plan: Visit Diagnoses:  1. Status post amputation of left great toe (HCC)     Plan: Place a clean dressing today with Xeroform on an ligament intact for 2 days. He'll wash the foot and dry completely apply a new dry dressing with Xeroform on it. If it begins to look macerated and he'll just apply dry dressing and leave off the Xeroform. Elevation of foot encouraged. We'll place him on doxycycline for 14 days. Follow-up in one week  Follow-Up Instructions: Return in about 1 week (around 09/07/2016).   Orders:  No orders of the defined types were placed in this encounter.  Meds ordered this encounter  Medications  . doxycycline (VIBRA-TABS) 100 MG tablet    Sig: Take 1 tablet (100 mg total) by mouth 2 (two) times daily.    Dispense:  28 tablet    Refill:  0      Procedures: No procedures performed   Clinical Data: No additional findings.   Subjective: No chief complaint on file.   HPI Stephen Fitzgerald returns today status post left great toe amputation week. He states he is having soreness particularly at night having some throbbing pain otherwise doing well. Next 1 Review of Systems   Objective: Vital Signs: There were no vitals taken for this visit.  Physical Exam  Ortho Exam Left foot he has some maceration and some areas of necrotic tissue tickly over the medial aspect incision. No resident purulence. No malodor. Slight erythema about the wound. The erythema distal left lower leg is resolving. Specialty Comments:  No specialty comments available.  Imaging: No results found.   PMFS History: Patient Active Problem List   Diagnosis Date Noted  . Status post amputation of left great toe (HCC) 08/31/2016    . 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 Gangrene of toe of left foot (HCC) 08/21/2016  . Osteomyelitis of toe of right foot (HCC) 12/10/2015  . Healthcare maintenance 04/27/2015  . Foot swelling 12/16/2014  . Amputation of left hand ring finger 09/23/2014  . 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 History of osteomyelitis 08/26/2014  . Serum albumin decreased 08/26/2014  . Vitamin B12 deficiency 08/18/2014  . Heart murmur, systolic   . Heart murmur 07/24/2013  . Hyperlipidemia 03/06/2013  . Hypertension 03/06/2013  . Tachycardia 01/13/2013  . Diabetes mellitus type 2 with complications, uncontrolled (HCC) 12/08/2012  . Expressive aphasia syndrome 12/08/2012  . 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 Abscess of hand, left 12/08/2012   left hand with only 3rd to 5th finger, 4th finger to midjoint.- reddened but dry and intact scar line.  . Bacteremia   . Blood poisoning (HCC)   . Diabetes mellitus without complication (HCC)    not usually checking blood sugars daily.  . Flexor tenosynovitis of finger 12/08/2012  . Hypertension   . Stroke [facility](HCC)    expressive aphasia  . Toe osteomyelitis, right (HCC)    right second toe    No family  history on file.  Past Surgical History:  Procedure Laterality Date  . ABDOMINAL AORTAGRAM N/A 08/13/2014   Procedure: ABDOMINAL [name];  Surgeon: Fransisco Hertz, MD;  Location: [facility] CATH LAB;  Service: Cardiovascular;  Laterality: N/A;  . AMPUTATION Left 12/06/2012   Procedure: AMPUTATION DIGIT LEFT INDEX;  Surgeon: [name], MD;  Location: [facility] OR;  Service: Orthopedics;  Laterality: Left;  . AMPUTATION Left 08/15/2014   Procedure: AMPUTATION LEFT RING FINGER;  Surgeon: [name], MD;  Location: [facility] OR;  Service: Orthopedics;  Laterality: Left;  . AMPUTATION Right 08/15/2014   Procedure: AMPUTATION RIGHT FOOT FIRST RAY AMPUTATION AND  IRRIGATION AND DEBREDMENT OF RIGHT FOOT ABSCESS;  Surgeon: [name], MD;  Location: [facility] OR;  Service: Orthopedics;  Laterality: Right;  . AMPUTATION Right 12/10/2015   Procedure: AMPUTATION RIGHT 2ND RAY;  Surgeon: [name], MD;  [facility];  Service: Orthopedics;  Laterality: Right;  . AMPUTATION Left 08/24/2016   Procedure: LEFT GREAT TOE AMPUTATION;  Surgeon: [name], MD;  Location[facility];  Service: Orthopedics;  Laterality: Left;  . HERNIA REPAIR     bilat ing age 18months  . I&D EXTREMITY Left 12/04/2012   Procedure: IRRIGATION AND DEBRIDEMENT Left Hand with Ring Removal  times three, Carpal Tunnel , Flexor tendon Synovectomy;  Surgeon: [name], MD;  Location[facility] OR;  Service: Orthopedics;  Laterality: Left;  . I&D EXTREMITY Left 12/06/2012   Procedure: IRRIGATION AND DEBRIDEMENT EXTREMITY LEFT HAND;  Surgeon: [name], MD;  Location: [facility] OR;  Service: Orthopedics;  Laterality: Left;  . NO PAST SURGERIES    . TENDON REPAIR Left 12/23/2012   Procedure: DIVISION AND INSETTING FLAP TO LEFT RING FINGER ADJACENT TISSUE REARRANGEMENT LEFT INDEX FINGER AMPUTATION SITE;  Surgeon: [name], MD;  Location: [facility];  Service: Orthopedics;  Laterality: Left;   Social History   Occupational History  . Not on file.   Social History Main Topics  . Smoking status: Never Smoker  . Smokeless tobacco: Never Used  . Alcohol use 4.8 oz/week    8 Cans of beer per week     Comment: 8-12 beers qd   . Drug use: No  . Sexual activity: Not on file

## 2016-09-07 ENCOUNTER — Ambulatory Visit (INDEPENDENT_AMBULATORY_CARE_PROVIDER_SITE_OTHER): Payer: Medicare Other | Admitting: Physician Assistant

## 2016-09-07 DIAGNOSIS — Z89412 Acquired absence of left great toe: Secondary | ICD-10-CM

## 2016-09-07 NOTE — Progress Notes (Signed)
Office Visit Note   Patient: Stephen Fitzgerald           Date of Birth: 1961/03/22           MRN: [id number] Visit Date: 09/07/2016              Requested by: Bonney Aid, MD [address] [place], [place] [address] PCP: Velora Heckler, MD   Assessment & Plan: Visit Diagnoses:  1. Status post amputation of left great toe (HCC)     Plan:  Wash his foot daily , completely dry and then apply a small amount of iodoform gel to the wound area. We'll see him back in 1 week to check his progress or lack of. We will see him back sooner if there is any significant sign of infection or concerns  Follow-Up Instructions: Return in about 1 week (around 09/14/2016).   Orders:  No orders of the defined types were placed in this encounter.  No orders of the defined types were placed in this encounter.     Procedures: No procedures performed   Clinical Data: No additional findings.   Subjective: No chief complaint on file.   HPI Stephen Fitzgerald returns today follow-up of his left great toe amputation site. Is now 2 weeks status post amputation. Splint using Xeroform over the foot wound with gauze dressing. Currently is on Cleocin. Review of Systems   Objective: Vital Signs: There were no vitals taken for this visit.  Physical Exam Well Developmental well-nourished male in no acute distress. Mood and Affect appropriate. Ortho Exam Left foot surgical incision sites healing well. That most proximal and distal portions. However the mid incision site of dictation is significantly macerated. There is no expressive purulence. No malodor. The actual amputation site of the great toe is filled with fibrinous tissue. Full-thickness debridement is performed today about the incision site. Sutures removed from the incision site Specialty Comments:  No specialty comments available.  Imaging: No results found.   PMFS History: Patient Active Problem List   Diagnosis Date Noted  . Status post  amputation of left great toe (HCC) 08/31/2016  . 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 Gangrene of toe of left foot (HCC) 08/21/2016  . Osteomyelitis of toe of right foot (HCC) 12/10/2015  . Healthcare maintenance 04/27/2015  . Foot swelling 12/16/2014  . Amputation of left hand ring finger 09/23/2014  . 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 History of osteomyelitis 08/26/2014  . Serum albumin decreased 08/26/2014  . Vitamin B12 deficiency 08/18/2014  . Heart murmur, systolic   . Heart murmur 07/24/2013  . Hyperlipidemia 03/06/2013  . Hypertension 03/06/2013  . Tachycardia 01/13/2013  . Diabetes mellitus type 2 with complications, uncontrolled (HCC) 12/08/2012  . Expressive aphasia syndrome 12/08/2012  . 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 Abscess of hand, left 12/08/2012   left hand with only 3rd to 5th finger, 4th finger to midjoint.- reddened but dry and intact scar line.  . Bacteremia   . Blood poisoning (HCC)   . Diabetes mellitus without complication (HCC)    not usually checking blood sugars daily.  . Flexor tenosynovitis of finger 12/08/2012  . Hypertension   . Stroke [facility])    expressive aphasia  . Toe osteomyelitis, right (HCC)    right second toe    No family history on file.  Past Surgical History:  Procedure  Laterality Date  . ABDOMINAL AORTAGRAM N/A 08/13/2014   Procedure: ABDOMINAL Ronny FlurryAORTAGRAM;  Surgeon: Fransisco HertzBrian L Chen, MD;  [facility];  Service: Cardiovascular;  Laterality: N/A;  . AMPUTATION Left 12/06/2012   Procedure: AMPUTATION DIGIT LEFT INDEX;  Surgeon: Jodi Marbleavid A Thompson, MD;  [facility];  Service: Orthopedics;  Laterality: Left;  . AMPUTATION Left 08/15/2014   Procedure: AMPUTATION LEFT RING FINGER;  Surgeon: Kathryne Hitchhristopher Y Blackman, MD;  Location: [facility] OR;  Service: Orthopedics;  Laterality: Left;  . AMPUTATION Right 08/15/2014   Procedure:  AMPUTATION RIGHT FOOT FIRST RAY AMPUTATION AND IRRIGATION AND DEBREDMENT OF RIGHT FOOT ABSCESS;  Surgeon: Kathryne Hitchhristopher Y Blackman, MD;  Location: [facility] OR;  Service: Orthopedics;  Laterality: Right;  . AMPUTATION Right 12/10/2015   Procedure: AMPUTATION RIGHT 2ND RAY;  Surgeon: Kathryne Hitchhristopher Y Blackman, MD;  Location: [facility] ORS;  Service: Orthopedics;  Laterality: Right;  . AMPUTATION Left 08/24/2016   Procedure: LEFT GREAT TOE AMPUTATION;  Surgeon: Kathryne Hitchhristopher Y Blackman, MD;  Location: [facility] ORS;  Service: Orthopedics;  Laterality: Left;  . HERNIA REPAIR     bilat ing age 18months  . I&D EXTREMITY Left 12/04/2012   Procedure: IRRIGATION AND DEBRIDEMENT Left Hand with Ring Removal  times three, Carpal Tunnel , Flexor tendon Synovectomy;  Surgeon: Jodi Marbleavid A Thompson, MD;  [facility];  Service: Orthopedics;  Laterality: Left;  . I&D EXTREMITY Left 12/06/2012   Procedure: IRRIGATION AND DEBRIDEMENT EXTREMITY LEFT HAND;  Surgeon: Jodi Marbleavid A Thompson, MD;  [facility];  Service: Orthopedics;  Laterality: Left;  . NO PAST SURGERIES    . TENDON REPAIR Left 12/23/2012   Procedure: DIVISION AND INSETTING FLAP TO LEFT RING FINGER ADJACENT TISSUE REARRANGEMENT LEFT INDEX FINGER AMPUTATION SITE;  Surgeon: Jodi Marbleavid A Thompson, MD;  Location: [facility];  Service: Orthopedics;  Laterality: Left;   Social History   Occupational History  . Not on file.   Social History Main Topics  . Smoking status: Never Smoker  . Smokeless tobacco: Never Used  . Alcohol use 4.8 oz/week    8 Cans of beer per week     Comment: 8-12 beers qd   . Drug use: No  . Sexual activity: Not on file

## 2016-09-11 ENCOUNTER — Other Ambulatory Visit (INDEPENDENT_AMBULATORY_CARE_PROVIDER_SITE_OTHER): Payer: Self-pay

## 2016-09-11 ENCOUNTER — Telehealth (INDEPENDENT_AMBULATORY_CARE_PROVIDER_SITE_OTHER): Payer: Self-pay | Admitting: Physician Assistant

## 2016-09-11 ENCOUNTER — Other Ambulatory Visit (INDEPENDENT_AMBULATORY_CARE_PROVIDER_SITE_OTHER): Payer: Self-pay | Admitting: Physician Assistant

## 2016-09-11 MED ORDER — CADEXOMER IODINE 0.9 % EX GEL: Freq: Every day | CUTANEOUS | Status: AC

## 2016-09-11 NOTE — Telephone Encounter (Signed)
Done this AM

## 2016-09-11 NOTE — Progress Notes (Unsigned)
iodo

## 2016-09-11 NOTE — Telephone Encounter (Signed)
Please advise 

## 2016-09-11 NOTE — Telephone Encounter (Signed)
Patient's father calling.  Went to pick up Rx Thursday and Friday but wasn't there yet.  [facility]

## 2016-09-14 ENCOUNTER — Ambulatory Visit (INDEPENDENT_AMBULATORY_CARE_PROVIDER_SITE_OTHER): Payer: Medicare Other | Admitting: Physician Assistant

## 2016-09-14 DIAGNOSIS — Z89412 Acquired absence of left great toe: Secondary | ICD-10-CM

## 2016-09-14 NOTE — Progress Notes (Signed)
Office Visit Note   Patient: Stephen Fitzgerald           Date of Birth: Sep 01, 1960           MRN: [id number] Visit Date: 09/14/2016              Requested by: Bonney Aid, MD [address] [place], [place] [address] PCP: Velora Heckler, MD   Assessment & Plan: Visit Diagnoses:  1. Status post amputation of left great toe (HCC)     Plan: Continue to wash the daily and apply iodoform gel. We'll see him back in 2 weeks to check his progress lack of. He is to continue the postop shoe. Callus and dead skin was removed from about the wound site today. No purulence was encountered.  Follow-Up Instructions: Return in about 2 weeks (around 09/28/2016) for wound check.   Orders:  No orders of the defined types were placed in this encounter.  No orders of the defined types were placed in this encounter.     Procedures: No procedures performed   Clinical Data: No additional findings.   Subjective: Chief Complaint  Patient presents with  . Left Foot - Follow-up    Patient here today to followup/wound check left great toe wound. He states overall he is doing well. He is had no fevers or chills. Finished his oral antibiotics as of this morning.    Review of Systems   Objective: Vital Signs: There were no vitals taken for this visit.  Physical Exam  Ortho Exam Great toe amp patient site there's decrease in erythema and maceration. No expressible purulence. The amputation site is filling in with a gritty good granular tissue. There is still some fibrinous tissue about the wound. Specialty Comments:  No specialty comments available.  Imaging: No results found.   PMFS History: Patient Active Problem List   Diagnosis Date Noted  . Status post amputation of left great toe (HCC) 08/31/2016  . 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 Gangrene of toe of left foot (HCC) 08/21/2016  . Osteomyelitis of toe of right foot (HCC)  12/10/2015  . Healthcare maintenance 04/27/2015  . Foot swelling 12/16/2014  . Amputation of left hand ring finger 09/23/2014  . 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 History of osteomyelitis 08/26/2014  . Serum albumin decreased 08/26/2014  . Vitamin B12 deficiency 08/18/2014  . Heart murmur, systolic   . Heart murmur 07/24/2013  . Hyperlipidemia 03/06/2013  . Hypertension 03/06/2013  . Tachycardia 01/13/2013  . Diabetes mellitus type 2 with complications, uncontrolled (HCC) 12/08/2012  . Expressive aphasia syndrome 12/08/2012  . 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 Abscess of hand, left 12/08/2012   left hand with only 3rd to 5th finger, 4th finger to midjoint.- reddened but dry and intact scar line.  . Bacteremia   . Blood poisoning (HCC)   . Diabetes mellitus without complication (HCC)    not usually checking blood sugars daily.  . Flexor tenosynovitis of finger 12/08/2012  . Hypertension   . Stroke [facility])    expressive aphasia  . Toe osteomyelitis, right (HCC)    right second toe    No family history on file.  Past Surgical History:  Procedure Laterality Date  . ABDOMINAL AORTAGRAM N/A 08/13/2014   Procedure: ABDOMINAL [name];  Surgeon: Fransisco Hertz, MD;  [facility] C[facility] LAB;  Service: Cardiovascular;  Laterality: N/A;  . AMPUTATION Left 12/06/2012   Procedure: AMPUTATION DIGIT LEFT INDEX;  Surgeon: Jodi Marbleavid A Thompson, MD;  [facility];  Service: Orthopedics;  Laterality: Left;  . AMPUTATION Left 08/15/2014   Procedure: AMPUTATION LEFT RING FINGER;  Surgeon: Kathryne Hitchhristopher Y Blackman, MD;  Location: [facility] OR;  Service: Orthopedics;  Laterality: Left;  . AMPUTATION Right 08/15/2014   Procedure: AMPUTATION RIGHT FOOT FIRST RAY AMPUTATION AND IRRIGATION AND DEBREDMENT OF RIGHT FOOT ABSCESS;  Surgeon: Kathryne Hitchhristopher Y Blackman, MD;  Location: [facility] OR;  Service: Orthopedics;  Laterality: Right;  . AMPUTATION Right 12/10/2015    Procedure: AMPUTATION RIGHT 2ND RAY;  Surgeon: Kathryne Hitchhristopher Y Blackman, MD;  [facility]: [facility]S;  Service: Orthopedics;  Laterality: Right;  . AMPUTATION Left 08/24/2016   Procedure: LEFT GREAT TOE AMPUTATION;  Surgeon: Kathryne Hitchhristopher Y Blackman, MD;  [facility]: [facility];  Service: Orthopedics;  Laterality: Left;  . HERNIA REPAIR     bilat ing age 18months  . I&D EXTREMITY Left 12/04/2012   Procedure: IRRIGATION AND DEBRIDEMENT Left Hand with Ring Removal  times three, Carpal Tunnel , Flexor tendon Synovectomy;  Surgeon: Jodi Marbleavid A Thompson, MD;  [facility];  Service: Orthopedics;  Laterality: Left;  . I&D EXTREMITY Left 12/06/2012   Procedure: IRRIGATION AND DEBRIDEMENT EXTREMITY LEFT HAND;  Surgeon: Jodi Marbleavid A Thompson, MD;  [facility];  Service: Orthopedics;  Laterality: Left;  . NO PAST SURGERIES    . TENDON REPAIR Left 12/23/2012   Procedure: DIVISION AND INSETTING FLAP TO LEFT RING FINGER ADJACENT TISSUE REARRANGEMENT LEFT INDEX FINGER AMPUTATION SITE;  Surgeon: Jodi Marbleavid A Thompson, MD;  Location: [facility];  Service: Orthopedics;  Laterality: Left;   Social History   Occupational History  . Not on file.   Social History Main Topics  . Smoking status: Never Smoker  . Smokeless tobacco: Never Used  . Alcohol use 4.8 oz/week    8 Cans of beer per week     Comment: 8-12 beers qd   . Drug use: No  . Sexual activity: Not on file

## 2016-09-28 ENCOUNTER — Ambulatory Visit (INDEPENDENT_AMBULATORY_CARE_PROVIDER_SITE_OTHER): Payer: Medicare Other | Admitting: Physician Assistant

## 2016-09-28 ENCOUNTER — Encounter (INDEPENDENT_AMBULATORY_CARE_PROVIDER_SITE_OTHER): Payer: Self-pay | Admitting: Physician Assistant

## 2016-09-28 ENCOUNTER — Encounter (INDEPENDENT_AMBULATORY_CARE_PROVIDER_SITE_OTHER): Payer: Self-pay

## 2016-09-28 DIAGNOSIS — E11621 Type 2 diabetes mellitus with foot ulcer: Secondary | ICD-10-CM

## 2016-09-28 DIAGNOSIS — IMO0002 Reserved for concepts with insufficient information to code with codable children: Secondary | ICD-10-CM

## 2016-09-28 DIAGNOSIS — L97526 Non-pressure chronic ulcer of other part of left foot with bone involvement without evidence of necrosis: Secondary | ICD-10-CM

## 2016-09-28 DIAGNOSIS — Z89412 Acquired absence of left great toe: Secondary | ICD-10-CM

## 2016-09-28 NOTE — Progress Notes (Signed)
Office Visit Note   Patient: Stephen Fitzgerald           Date of Birth: [birth year]           MRN: [id number] Visit Date: 09/28/2016              Requested by: Bonney Aid, MD [address] [place], [place] [address] PCP: Velora Heckler, MD   Assessment & Plan: Visit Diagnoses:  1. Great toe amputation status, left (HCC)   2. Diabetic ulcer of other part of left foot associated with type 2 diabetes mellitus, with bone involvement without evidence of necrosis (HCC)     Plan: We'll set him up for irrigation and debridement of the stage IV diabetic ulcer over the first metatarsal. He noticed that he may in fact have to also amputate his second toe distally getting enough skin coverage. See him back in 2 weeks postop.  Follow-Up Instructions: Return in about 2 weeks (around 10/12/2016) for post op, wound check.   Orders:  No orders of the defined types were placed in this encounter.  No orders of the defined types were placed in this encounter.     Procedures: No procedures performed   Clinical Data: No additional findings.   Subjective: Chief Complaint  Patient presents with  . Left Foot - Follow-up    HPI Stephen Fitzgerald returns today now 5 weeks status post left great toe amputation. He is been doing out of foam gel to the wound. No change in overall dressing. Had no fevers chills. Review of Systems   Objective: Vital Signs: There were no vitals taken for this visit.  Physical Exam  Ortho Exam Left foot toe imitation site significant callus about the amputation site. There is tissue no granular tissue within the wound today. There is exposed bone center of the wound. No malodor. No purulent material  is expressible. Edema of the lower leg is resolved. I did not see any evidence of erythema about the foot otherwise.  Specialty Comments:  No specialty comments available.  Imaging: No results found.   PMFS History: Patient Active Problem List   Diagnosis  Date Noted  . Status post amputation of left great toe (HCC) 08/31/2016  . 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 Gangrene of toe of left foot (HCC) 08/21/2016  . Osteomyelitis of toe of right foot (HCC) 12/10/2015  . Healthcare maintenance 04/27/2015  . Foot swelling 12/16/2014  . Amputation of left hand ring finger 09/23/2014  . 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 History of osteomyelitis 08/26/2014  . Serum albumin decreased 08/26/2014  . Vitamin B12 deficiency 08/18/2014  . Heart murmur, systolic   . Heart murmur 07/24/2013  . Hyperlipidemia 03/06/2013  . Hypertension 03/06/2013  . Tachycardia 01/13/2013  . Diabetes mellitus type 2 with complications, uncontrolled (HCC) 12/08/2012  . Expressive aphasia syndrome 12/08/2012  . 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 Abscess of hand, left 12/08/2012   left hand with only 3rd to 5th finger, 4th finger to midjoint.- reddened but dry and intact scar line.  . Bacteremia   . Blood poisoning (HCC)   . Diabetes mellitus without complication (HCC)    not usually checking blood sugars daily.  . Flexor tenosynovitis of finger 12/08/2012  . Hypertension   . Stroke [facility])    expressive aphasia  . Toe osteomyelitis, right (HCC)  right second toe    No family history on file.  Past Surgical History:  Procedure Laterality Date  . ABDOMINAL AORTAGRAM N/A 08/13/2014   Procedure: ABDOMINAL Ronny FlurryAORTAGRAM;  Surgeon: Fransisco HertzBrian L Chen, MD;  [facility];  Service: Cardiovascular;  Laterality: N/A;  . AMPUTATION Left 12/06/2012   Procedure: AMPUTATION DIGIT LEFT INDEX;  Surgeon: Jodi Marbleavid A Thompson, MD;  [facility];  Service: Orthopedics;  Laterality: Left;  . AMPUTATION Left 08/15/2014   Procedure: AMPUTATION LEFT RING FINGER;  Surgeon: Kathryne Hitchhristopher Y Blackman, MD;  Location: [facility] OR;  Service: Orthopedics;  Laterality: Left;  . AMPUTATION  Right 08/15/2014   Procedure: AMPUTATION RIGHT FOOT FIRST RAY AMPUTATION AND IRRIGATION AND DEBREDMENT OF RIGHT FOOT ABSCESS;  Surgeon: Kathryne Hitchhristopher Y Blackman, MD;  Location: [facility] OR;  Service: Orthopedics;  Laterality: Right;  . AMPUTATION Right 12/10/2015   Procedure: AMPUTATION RIGHT 2ND RAY;  Surgeon: Kathryne Hitchhristopher Y Blackman, MD;  [facility]: [facility]S;  Service: Orthopedics;  Laterality: Right;  . AMPUTATION Left 08/24/2016   Procedure: LEFT GREAT TOE AMPUTATION;  Surgeon: Kathryne Hitchhristopher Y Blackman, MD;  Location: [facility] ORS;  Service: Orthopedics;  Laterality: Left;  . HERNIA REPAIR     bilat ing age 18months  . I&D EXTREMITY Left 12/04/2012   Procedure: IRRIGATION AND DEBRIDEMENT Left Hand with Ring Removal  times three, Carpal Tunnel , Flexor tendon Synovectomy;  Surgeon: Jodi Marbleavid A Thompson, MD;  [facility];  Service: Orthopedics;  Laterality: Left;  . I&D EXTREMITY Left 12/06/2012   Procedure: IRRIGATION AND DEBRIDEMENT EXTREMITY LEFT HAND;  Surgeon: Jodi Marbleavid A Thompson, MD;  [facility];  Service: Orthopedics;  Laterality: Left;  . NO PAST SURGERIES    . TENDON REPAIR Left 12/23/2012   Procedure: DIVISION AND INSETTING FLAP TO LEFT RING FINGER ADJACENT TISSUE REARRANGEMENT LEFT INDEX FINGER AMPUTATION SITE;  Surgeon: Jodi Marbleavid A Thompson, MD;  Location: [facility];  Service: Orthopedics;  Laterality: Left;   Social History   Occupational History  . Not on file.   Social History Main Topics  . Smoking status: Never Smoker  . Smokeless tobacco: Never Used  . Alcohol use 4.8 oz/week    8 Cans of beer per week     Comment: 8-12 beers qd   . Drug use: No  . Sexual activity: Not on file

## 2016-09-29 ENCOUNTER — Other Ambulatory Visit (INDEPENDENT_AMBULATORY_CARE_PROVIDER_SITE_OTHER): Payer: Self-pay | Admitting: Physician Assistant

## 2016-10-02 ENCOUNTER — Encounter (HOSPITAL_COMMUNITY): Payer: Self-pay | Admitting: *Deleted

## 2016-10-02 NOTE — Progress Notes (Signed)
Patient denies chest pain or shortness of breath

## 2016-10-03 ENCOUNTER — Ambulatory Visit (HOSPITAL_COMMUNITY)
Admission: RE | Admit: 2016-10-03 | Discharge: 2016-10-03 | Disposition: A | Discharge status: Home / Self Care | Payer: Medicare Other | Source: Ambulatory Visit | Attending: Orthopaedic Surgery | Admitting: Orthopaedic Surgery

## 2016-10-03 ENCOUNTER — Ambulatory Visit (HOSPITAL_COMMUNITY): Payer: Medicare Other | Admitting: Certified Registered Nurse Anesthetist

## 2016-10-03 ENCOUNTER — Encounter (HOSPITAL_COMMUNITY)
Admission: RE | Disposition: A | Discharge status: Home / Self Care | Payer: Self-pay | Source: Ambulatory Visit | Attending: Orthopaedic Surgery

## 2016-10-03 ENCOUNTER — Encounter (HOSPITAL_COMMUNITY): Payer: Self-pay | Admitting: *Deleted

## 2016-10-03 DIAGNOSIS — Z89412 Acquired absence of left great toe: Secondary | ICD-10-CM | POA: Insufficient documentation

## 2016-10-03 DIAGNOSIS — E1151 Type 2 diabetes mellitus with diabetic peripheral angiopathy without gangrene: Secondary | ICD-10-CM | POA: Insufficient documentation

## 2016-10-03 DIAGNOSIS — Z7982 Long term (current) use of aspirin: Secondary | ICD-10-CM | POA: Insufficient documentation

## 2016-10-03 DIAGNOSIS — Z7984 Long term (current) use of oral hypoglycemic drugs: Secondary | ICD-10-CM | POA: Insufficient documentation

## 2016-10-03 DIAGNOSIS — I69320 Aphasia following cerebral infarction: Secondary | ICD-10-CM | POA: Insufficient documentation

## 2016-10-03 DIAGNOSIS — Z79899 Other long term (current) drug therapy: Secondary | ICD-10-CM | POA: Insufficient documentation

## 2016-10-03 DIAGNOSIS — Z89022 Acquired absence of left finger(s): Secondary | ICD-10-CM | POA: Diagnosis not present

## 2016-10-03 DIAGNOSIS — R011 Cardiac murmur, unspecified: Secondary | ICD-10-CM | POA: Diagnosis not present

## 2016-10-03 DIAGNOSIS — T8189XA Other complications of procedures, not elsewhere classified, initial encounter: Secondary | ICD-10-CM | POA: Diagnosis not present

## 2016-10-03 DIAGNOSIS — L97529 Non-pressure chronic ulcer of other part of left foot with unspecified severity: Secondary | ICD-10-CM | POA: Diagnosis not present

## 2016-10-03 DIAGNOSIS — I10 Essential (primary) hypertension: Secondary | ICD-10-CM | POA: Diagnosis not present

## 2016-10-03 DIAGNOSIS — E11621 Type 2 diabetes mellitus with foot ulcer: Secondary | ICD-10-CM | POA: Diagnosis not present

## 2016-10-03 DIAGNOSIS — Y835 Amputation of limb(s) as the cause of abnormal reaction of the patient, or of later complication, without mention of misadventure at the time of the procedure: Secondary | ICD-10-CM | POA: Diagnosis not present

## 2016-10-03 DIAGNOSIS — Z89431 Acquired absence of right foot: Secondary | ICD-10-CM | POA: Insufficient documentation

## 2016-10-03 DIAGNOSIS — L97524 Non-pressure chronic ulcer of other part of left foot with necrosis of bone: Secondary | ICD-10-CM | POA: Diagnosis not present

## 2016-10-03 HISTORY — PX: I&D EXTREMITY: SHX5045

## 2016-10-03 LAB — COMPREHENSIVE METABOLIC PANEL
ALBUMIN: 3.1 g/dL — AB (ref 3.5–5.0)
ALT: 17 U/L (ref 17–63)
AST: 14 U/L — ABNORMAL LOW (ref 15–41)
Alkaline Phosphatase: 101 U/L (ref 38–126)
Anion gap: 12 (ref 5–15)
BUN: 11 mg/dL (ref 6–20)
CHLORIDE: 109 mmol/L (ref 101–111)
CO2: 21 mmol/L — ABNORMAL LOW (ref 22–32)
Calcium: 9.1 mg/dL (ref 8.9–10.3)
Creatinine, Ser: 0.84 mg/dL (ref 0.61–1.24)
GFR calc Af Amer: >60 mL/min (ref >60)
GLUCOSE: 149 mg/dL — AB (ref 65–99)
POTASSIUM: 4.5 mmol/L (ref 3.5–5.1)
SODIUM: 142 mmol/L (ref 135–145)
Total Bilirubin: 0.8 mg/dL (ref 0.3–1.2)
Total Protein: 6.7 g/dL (ref 6.5–8.1)

## 2016-10-03 LAB — CBC
HCT: 37.3 % — ABNORMAL LOW (ref 39.0–52.0)
Hemoglobin: 12 g/dL — ABNORMAL LOW (ref 13.0–17.0)
MCH: 27.8 pg (ref 26.0–34.0)
MCHC: 32.2 g/dL (ref 30.0–36.0)
MCV: 86.3 fL (ref 78.0–100.0)
PLATELETS: 247 10*3/uL (ref 150–400)
RBC: 4.32 MIL/uL (ref 4.22–5.81)
RDW: 14.5 % (ref 11.5–15.5)
WBC: 9.8 10*3/uL (ref 4.0–10.5)

## 2016-10-03 LAB — GLUCOSE, CAPILLARY
GLUCOSE-CAPILLARY: 145 mg/dL — AB (ref 65–99)
GLUCOSE-CAPILLARY: 116 mg/dL — AB (ref 65–99)

## 2016-10-03 SURGERY — IRRIGATION AND DEBRIDEMENT EXTREMITY
Anesthesia: General | Site: Foot | Laterality: Left

## 2016-10-03 MED ORDER — HYDROMORPHONE HCL 1 MG/ML IJ SOLN
INTRAMUSCULAR | Status: AC
  Administered 2016-10-03: 0.5 mg via INTRAVENOUS
  Filled 2016-10-03: qty 0.5

## 2016-10-03 MED ORDER — PHENYLEPHRINE 40 MCG/ML (10ML) SYRINGE FOR IV PUSH (FOR BLOOD PRESSURE SUPPORT)
PREFILLED_SYRINGE | INTRAVENOUS | Status: AC
  Filled 2016-10-03: qty 10

## 2016-10-03 MED ORDER — MIDAZOLAM HCL 2 MG/2ML IJ SOLN
INTRAMUSCULAR | Status: AC
  Filled 2016-10-03: qty 2

## 2016-10-03 MED ORDER — CLINDAMYCIN PHOSPHATE 900 MG/50ML IV SOLN
900.0000 mg | INTRAVENOUS | Status: AC
  Administered 2016-10-03: 900 mg via INTRAVENOUS
  Filled 2016-10-03: qty 50

## 2016-10-03 MED ORDER — ONDANSETRON HCL 4 MG/2ML IJ SOLN
INTRAMUSCULAR | Status: DC | PRN
  Administered 2016-10-03: 4 mg via INTRAVENOUS

## 2016-10-03 MED ORDER — SODIUM CHLORIDE 0.9 % IR SOLN
Status: DC | PRN
  Administered 2016-10-03: 1000 mL

## 2016-10-03 MED ORDER — PROPOFOL 10 MG/ML IV BOLUS
INTRAVENOUS | Status: AC
  Filled 2016-10-03: qty 20

## 2016-10-03 MED ORDER — FENTANYL CITRATE (PF) 100 MCG/2ML IJ SOLN
INTRAMUSCULAR | Status: DC | PRN
  Administered 2016-10-03: 50 ug via INTRAVENOUS

## 2016-10-03 MED ORDER — PROPOFOL 10 MG/ML IV BOLUS
INTRAVENOUS | Status: DC | PRN
  Administered 2016-10-03: 200 mg via INTRAVENOUS

## 2016-10-03 MED ORDER — ONDANSETRON HCL 4 MG/2ML IJ SOLN
INTRAMUSCULAR | Status: AC
  Filled 2016-10-03: qty 2

## 2016-10-03 MED ORDER — LACTATED RINGERS IV SOLN
INTRAVENOUS | Status: DC
  Administered 2016-10-03 (×2): via INTRAVENOUS

## 2016-10-03 MED ORDER — PHENYLEPHRINE HCL 10 MG/ML IJ SOLN
INTRAVENOUS | Status: DC | PRN
  Administered 2016-10-03: 30 ug/min via INTRAVENOUS

## 2016-10-03 MED ORDER — LIDOCAINE HCL (CARDIAC) 20 MG/ML IV SOLN
INTRAVENOUS | Status: DC | PRN
  Administered 2016-10-03: 100 mg via INTRAVENOUS

## 2016-10-03 MED ORDER — MIDAZOLAM HCL 2 MG/2ML IJ SOLN
INTRAMUSCULAR | Status: DC | PRN
  Administered 2016-10-03: 2 mg via INTRAVENOUS

## 2016-10-03 MED ORDER — HYDROMORPHONE HCL 1 MG/ML IJ SOLN
0.2500 mg | INTRAMUSCULAR | Status: DC | PRN
  Administered 2016-10-03: 0.5 mg via INTRAVENOUS

## 2016-10-03 MED ORDER — PHENYLEPHRINE HCL 10 MG/ML IJ SOLN
INTRAMUSCULAR | Status: DC | PRN
  Administered 2016-10-03 (×2): 120 ug via INTRAVENOUS
  Administered 2016-10-03: 80 ug via INTRAVENOUS

## 2016-10-03 MED ORDER — CHLORHEXIDINE GLUCONATE 4 % EX LIQD: 60.0000 mL | Freq: Once | CUTANEOUS | Status: DC

## 2016-10-03 MED ORDER — HYDROCODONE-ACETAMINOPHEN 5-325 MG PO TABS: 1.0000 | ORAL_TABLET | Freq: Four times a day (QID) | ORAL | 0 refills | Status: DC | PRN

## 2016-10-03 MED ORDER — FENTANYL CITRATE (PF) 100 MCG/2ML IJ SOLN
INTRAMUSCULAR | Status: AC
  Filled 2016-10-03: qty 2

## 2016-10-03 MED ORDER — HYDROCODONE-ACETAMINOPHEN 5-325 MG PO TABS
ORAL_TABLET | ORAL | Status: AC
  Administered 2016-10-03: 1
  Filled 2016-10-03: qty 1

## 2016-10-03 SURGICAL SUPPLY — 50 items
BANDAGE ELASTIC 3 VELCRO ST LF (GAUZE/BANDAGES/DRESSINGS) IMPLANT
BLADE AVERAGE 25MMX9MM (BLADE) ×1
BLADE AVERAGE 25X9 (BLADE) ×3 IMPLANT
BLADE SURG 10 STRL SS (BLADE) ×4 IMPLANT
BNDG COHESIVE 4X5 TAN STRL (GAUZE/BANDAGES/DRESSINGS) ×4 IMPLANT
BNDG COHESIVE 6X5 TAN STRL LF (GAUZE/BANDAGES/DRESSINGS) IMPLANT
BNDG GAUZE ELAST 4 BULKY (GAUZE/BANDAGES/DRESSINGS) ×4 IMPLANT
COVER SURGICAL LIGHT HANDLE (MISCELLANEOUS) ×4 IMPLANT
CUFF TOURNIQUET SINGLE 18IN (TOURNIQUET CUFF) IMPLANT
CUFF TOURNIQUET SINGLE 34IN LL (TOURNIQUET CUFF) IMPLANT
DRAPE ORTHO SPLIT 77X108 STRL (DRAPES)
DRAPE SURG 17X23 STRL (DRAPES) IMPLANT
DRAPE SURG ORHT 6 SPLT 77X108 (DRAPES) IMPLANT
DRAPE U-SHAPE 47X51 STRL (DRAPES) ×4 IMPLANT
DURAPREP 26ML APPLICATOR (WOUND CARE) IMPLANT
ELECT REM PT RETURN 9FT ADLT (ELECTROSURGICAL)
ELECTRODE REM PT RTRN 9FT ADLT (ELECTROSURGICAL) IMPLANT
GAUZE SPONGE 4X4 12PLY STRL (GAUZE/BANDAGES/DRESSINGS) IMPLANT
GAUZE SPONGE 4X4 12PLY STRL LF (GAUZE/BANDAGES/DRESSINGS) ×4 IMPLANT
GAUZE XEROFORM 1X8 LF (GAUZE/BANDAGES/DRESSINGS) ×4 IMPLANT
GLOVE BIO SURGEON STRL SZ8 (GLOVE) ×4 IMPLANT
GLOVE BIOGEL PI IND STRL 8 (GLOVE) ×2 IMPLANT
GLOVE BIOGEL PI INDICATOR 8 (GLOVE) ×2
GLOVE ORTHO TXT STRL SZ7.5 (GLOVE) ×4 IMPLANT
GOWN STRL REUS W/ TWL LRG LVL3 (GOWN DISPOSABLE) ×4 IMPLANT
GOWN STRL REUS W/ TWL XL LVL3 (GOWN DISPOSABLE) IMPLANT
GOWN STRL REUS W/TWL LRG LVL3 (GOWN DISPOSABLE) ×4
GOWN STRL REUS W/TWL XL LVL3 (GOWN DISPOSABLE)
HANDPIECE INTERPULSE COAX TIP (DISPOSABLE)
KIT BASIN OR (CUSTOM PROCEDURE TRAY) ×4 IMPLANT
KIT ROOM TURNOVER OR (KITS) ×4 IMPLANT
MANIFOLD NEPTUNE II (INSTRUMENTS) IMPLANT
NS IRRIG 1000ML POUR BTL (IV SOLUTION) ×4 IMPLANT
PACK ORTHO EXTREMITY (CUSTOM PROCEDURE TRAY) ×4 IMPLANT
PAD ARMBOARD 7.5X6 YLW CONV (MISCELLANEOUS) ×4 IMPLANT
PADDING CAST ABS 4INX4YD NS (CAST SUPPLIES)
PADDING CAST ABS COTTON 4X4 ST (CAST SUPPLIES) IMPLANT
PADDING CAST COTTON 6X4 STRL (CAST SUPPLIES) IMPLANT
SET HNDPC FAN SPRY TIP SCT (DISPOSABLE) IMPLANT
SPONGE LAP 18X18 X RAY DECT (DISPOSABLE) ×4 IMPLANT
STOCKINETTE IMPERVIOUS 9X36 MD (GAUZE/BANDAGES/DRESSINGS) IMPLANT
SUT ETHILON 2 0 FS 18 (SUTURE) ×8 IMPLANT
SUT ETHILON 3 0 PS 1 (SUTURE) IMPLANT
TOWEL OR 17X24 6PK STRL BLUE (TOWEL DISPOSABLE) ×4 IMPLANT
TOWEL OR 17X26 10 PK STRL BLUE (TOWEL DISPOSABLE) ×4 IMPLANT
TUBE ANAEROBIC SPECIMEN COL (MISCELLANEOUS) IMPLANT
TUBE CONNECTING 12'X1/4 (SUCTIONS) ×1
TUBE CONNECTING 12X1/4 (SUCTIONS) ×3 IMPLANT
UNDERPAD 30X30 (UNDERPADS AND DIAPERS) ×4 IMPLANT
YANKAUER SUCT BULB TIP NO VENT (SUCTIONS) ×4 IMPLANT

## 2016-10-03 NOTE — Anesthesia Preprocedure Evaluation (Signed)
Anesthesia Evaluation  Patient identified by MRN, date of birth, ID band Patient awake    Reviewed: Allergy & Precautions, NPO status , Patient's Chart, lab work & pertinent test results  Airway Mallampati: II       Dental   Pulmonary neg pulmonary ROS,    breath sounds clear to auscultation       Cardiovascular hypertension,  Rhythm:Regular Rate:Normal     Neuro/Psych    GI/Hepatic negative GI ROS, Neg liver ROS,   Endo/Other  diabetes  Renal/GU negative Renal ROS     Musculoskeletal   Abdominal   Peds  Hematology   Anesthesia Other Findings   Reproductive/Obstetrics                             Anesthesia Physical Anesthesia Plan  ASA: III  Anesthesia Plan: General   Post-op Pain Management:    Induction: Intravenous  Airway Management Planned: Simple Face Mask  Additional Equipment:   Intra-op Plan:   Post-operative Plan:   Informed Consent: I have reviewed the patients History and Physical, chart, labs and discussed the procedure including the risks, benefits and alternatives for the proposed anesthesia with the patient or authorized representative who has indicated his/her understanding and acceptance.   Dental advisory given  Plan Discussed with: CRNA and Anesthesiologist  Anesthesia Plan Comments:         Anesthesia Quick Evaluation

## 2016-10-03 NOTE — Discharge Instructions (Signed)
Keep your left foot dressing clean and dry. Do not remove your left foot dressing. Only put weight on your left heel. Try to stay off of your foot as much as possible and elevate your foot several times throughout the day to decrease swelling.

## 2016-10-03 NOTE — Brief Op Note (Signed)
10/03/2016  2:43 PM  PATIENT:  Stephen Fitzgerald  56 y.o. male  PRE-OPERATIVE DIAGNOSIS:  Stage 4 Diabetic Ulcer Left Foot  POST-OPERATIVE DIAGNOSIS:  Stage 4 Diabetic Ulcer Left Foot  PROCEDURE:  Procedure(s): IRRIGATION AND DEBRIDEMENT LEFT 1ST METATARSAL with Wound Closure (Left)  SURGEON:  Surgeon(s) and Role:    * Kathryne Hitchhristopher Y Walter Min, MD - Primary  ANESTHESIA:   general  EBL: < 50 cc  COUNTS:  YES  TOURNIQUET:   none  DICTATION: .Other Dictation: Dictation Number [id number]  PLAN OF CARE: Discharge to home after PACU  PATIENT DISPOSITION:  PACU - hemodynamically stable.   Delay start of Pharmacological VTE agent (>24hrs) due to surgical blood loss or risk of bleeding: no

## 2016-10-03 NOTE — Anesthesia Postprocedure Evaluation (Signed)
Anesthesia Post Note  Patient: Stephen Fitzgerald  Procedure(s) Performed: Procedure(s) (LRB): IRRIGATION AND DEBRIDEMENT LEFT 1ST METATARSAL with Wound Closure (Left)  Patient location during evaluation: PACU Anesthesia Type: General Level of consciousness: awake Pain management: pain level controlled Vital Signs Assessment: post-procedure vital signs reviewed and stable Respiratory status: spontaneous breathing Cardiovascular status: stable Anesthetic complications: no       Last Vitals:  Vitals:   10/03/16 1542 10/03/16 1545  BP:  137/81  Pulse: 97 94  Resp: 20 16  Temp:      Last Pain:  Vitals:   10/03/16 1542  TempSrc:   PainSc: 7                  Lucan Riner

## 2016-10-03 NOTE — Anesthesia Procedure Notes (Signed)
Procedure Name: LMA Insertion Date/Time: 10/03/2016 2:10 PM Performed by: Little IshikawaMERCER, Justino Boze L Pre-anesthesia Checklist: Patient identified, Emergency Drugs available, Suction available and Patient being monitored Patient Re-evaluated:Patient Re-evaluated prior to inductionOxygen Delivery Method: Circle System Utilized Preoxygenation: Pre-oxygenation with 100% oxygen Intubation Type: IV induction Ventilation: Mask ventilation without difficulty LMA: LMA inserted LMA Size: 4.0 Number of attempts: 1 Airway Equipment and Method: Bite block Placement Confirmation: positive ETCO2 Tube secured with: Tape Dental Injury: Teeth and Oropharynx as per pre-operative assessment

## 2016-10-03 NOTE — Transfer of Care (Signed)
Immediate Anesthesia Transfer of Care Note  Patient: Stephen Fitzgerald  Procedure(s) Performed: Procedure(s): IRRIGATION AND DEBRIDEMENT LEFT 1ST METATARSAL with Wound Closure (Left)  Patient Location: PACU  Anesthesia Type:General  Level of Consciousness: awake and alert   Airway & Oxygen Therapy: Patient Spontanous Breathing and Patient connected to nasal cannula oxygen  Post-op Assessment: Report given to RN and Post -op Vital signs reviewed and stable  Post vital signs: Reviewed and stable  Last Vitals:  Vitals:   10/03/16 1116  BP: (!) 153/95  Pulse: 100  Resp: 20  Temp: 37.2 C    Last Pain:  Vitals:   10/03/16 1116  TempSrc: Oral         Complications: No apparent anesthesia complications

## 2016-10-03 NOTE — H&P (Signed)
Stephen SprinklesJeffrey W Fitzgerald is an 56 y.o. male.   Chief Complaint:   Chronic left foot wound with exposed bone HPI:   56 yo diabetic who recently underwent a left foot 1st ray resection secondary to chronic osteomyelitis.  He was been unable to fully heal the wound in spite of wound treatment efforts.  He still has exposed bone and presents for further surgery in an attempt to salvage his foot.  Past Medical History:  Diagnosis Date  . Abscess of hand, left 12/08/2012   left hand with only 3rd to 5th finger, 4th finger to midjoint.- reddened but dry and intact scar line.  . Bacteremia   . Blood poisoning (HCC)   . Diabetes mellitus without complication (HCC)    not usually checking blood sugars daily.  . Flexor tenosynovitis of finger 12/08/2012  . Hypertension   . Stroke [facility](HCC)    expressive aphasia  . Toe osteomyelitis, right (HCC)    right second toe    Past Surgical History:  Procedure Laterality Date  . ABDOMINAL AORTAGRAM N/A 08/13/2014   Procedure: ABDOMINAL Ronny FlurryAORTAGRAM;  Surgeon: Fransisco HertzBrian L Chen, MD;  [facility];  Service: Cardiovascular;  Laterality: N/A;  . AMPUTATION Left 12/06/2012   Procedure: AMPUTATION DIGIT LEFT INDEX;  Surgeon: Jodi Marbleavid A Thompson, MD;  [facility];  Service: Orthopedics;  Laterality: Left;  . AMPUTATION Left 08/15/2014   Procedure: AMPUTATION LEFT RING FINGER;  Surgeon: Kathryne Hitchhristopher Y Aviraj Kentner, MD;  Location: [facility] OR;  Service: Orthopedics;  Laterality: Left;  . AMPUTATION Right 08/15/2014   Procedure: AMPUTATION RIGHT FOOT FIRST RAY AMPUTATION AND IRRIGATION AND DEBREDMENT OF RIGHT FOOT ABSCESS;  Surgeon: Kathryne Hitchhristopher Y Bladen Umar, MD;  Location: [facility] OR;  Service: Orthopedics;  Laterality: Right;  . AMPUTATION Right 12/10/2015   Procedure: AMPUTATION RIGHT 2ND RAY;  Surgeon: Kathryne Hitchhristopher Y Londyn Wotton, MD;  Location: [facility] ORS;  Service: Orthopedics;  Laterality: Right;  . AMPUTATION Left 08/24/2016   Procedure: LEFT GREAT TOE AMPUTATION;  Surgeon: Kathryne Hitchhristopher Y Pleas Carneal,  MD;  Location: [facility] ORS;  Service: Orthopedics;  Laterality: Left;  . HERNIA REPAIR     bilat ing age 18months  . I&D EXTREMITY Left 12/04/2012   Procedure: IRRIGATION AND DEBRIDEMENT Left Hand with Ring Removal  times three, Carpal Tunnel , Flexor tendon Synovectomy;  Surgeon: Jodi Marbleavid A Thompson, MD;  [facility];  Service: Orthopedics;  Laterality: Left;  . I&D EXTREMITY Left 12/06/2012   Procedure: IRRIGATION AND DEBRIDEMENT EXTREMITY LEFT HAND;  Surgeon: Jodi Marbleavid A Thompson, MD;  [facility];  Service: Orthopedics;  Laterality: Left;  . TENDON REPAIR Left 12/23/2012   Procedure: DIVISION AND INSETTING FLAP TO LEFT RING FINGER ADJACENT TISSUE REARRANGEMENT LEFT INDEX FINGER AMPUTATION SITE;  Surgeon: Jodi Marbleavid A Thompson, MD;  Location: [facility];  Service: Orthopedics;  Laterality: Left;    History reviewed. No pertinent family history. Social History:  reports that he has never smoked. He has never used smokeless tobacco. He reports that he drinks about 33.6 oz of alcohol per week . He reports that he does not use drugs.  Allergies:  Allergies  Allergen Reactions  . Unasyn [Ampicillin-Sulbactam Sodium] Rash    Facility-Administered Medications Prior to Admission  Medication Dose Route Frequency Provider Last Rate Last Dose  . cadexomer iodine (IODOSORB) 0.9 % gel   Topical Daily Kirtland BouchardGilbert W Clark, PA-C       Medications Prior to Admission  Medication Sig Dispense Refill  . atorvastatin (LIPITOR) 40 MG tablet TAKE 1  TABLET BY MOUTH DAILY (Patient taking differently: Take 40 mg by mouth daily. ) 90 tablet 3  . Cyanocobalamin (B-12) 2000 MCG TABS Take 1 tablet by mouth daily.    Marland Kitchen glipiZIDE (GLUCOTROL) 5 MG tablet TAKE 1 TABLET BY MOUTH TWICE DAILY BEFORE MEALS 180 tablet 3  . metFORMIN (GLUCOPHAGE-XR) 500 MG 24 hr tablet TAKE 4 TABLETS BY MOUTH EVERY DAY WITH BREAKFAST 360 tablet 3  . metoprolol succinate (TOPROL-XL) 50 MG 24 hr tablet Take 1 tablet (50 mg total) by mouth  daily. Take with or immediately following a meal. 90 tablet 3  . Naphazoline-Glycerin (CLEAR EYES MAX REDNESS RELIEF OP) Apply 2 drops to eye daily after supper.    Marland Kitchen aspirin 81 MG tablet Take 1 tablet (81 mg total) by mouth daily. 90 tablet 3    No results found for this or any previous visit (from the past 48 hour(s)). No results found.  Review of Systems  All other systems reviewed and are negative.   Blood pressure (!) 153/95, pulse 100, temperature 99 F (37.2 C), temperature source Oral, resp. rate 20, height 6' (1.829 m), weight 200 lb (90.7 kg), SpO2 100 %. Physical Exam  Constitutional: He is oriented to person, place, and time. He appears well-developed and well-nourished.  HENT:  Head: Normocephalic and atraumatic.  Eyes: EOM are normal. Pupils are equal, round, and reactive to light.  Neck: Normal range of motion. Neck supple.  Cardiovascular: Normal rate and regular rhythm.   Respiratory: Effort normal and breath sounds normal.  GI: Soft. Bowel sounds are normal.  Musculoskeletal:       Feet:  Neurological: He is alert and oriented to person, place, and time.  Skin: Skin is warm and dry.  Psychiatric: He has a normal mood and affect.     Assessment/Plan Chronic left foot wound post-1st ray amputation 1)  To the OR today as an outpatient for repeat irrigation and debridement of his left foot wound with an attempt to close the wound.  He understands fully that we may need to amputate his 2nd toe to get wound closure.  [name], MD 10/03/2016, 12:07 PM

## 2016-10-04 ENCOUNTER — Encounter (HOSPITAL_COMMUNITY): Payer: Self-pay | Admitting: Orthopaedic Surgery

## 2016-10-04 NOTE — Op Note (Signed)
NAME:  Stephen Fitzgerald, Stephen Fitzgerald                ACCOUNT NO.:  MEDICAL RECORD NO.:  [id number]  LOCATION:                                 FACILITY:  PHYSICIAN:  Vanita PandaChristopher Y. Magnus IvanBlackman, M.D.DATE OF BIRTH:  DATE OF PROCEDURE:  10/03/2016 DATE OF DISCHARGE:                              OPERATIVE REPORT   PREOPERATIVE DIAGNOSIS:  Left foot wound with exposed bone status post 1st ray amputation.  POSTOPERATIVE DIAGNOSIS:  Left foot wound with exposed bone status post 1st ray amputation.  PROCEDURE: 1. Irrigation and debridement of left foot 1st ray wound with sharp     excisional debridement of necrotic skin and debridement of bone     using an oscillating saw. 2. Delayed primary closure of left foot 1st ray wound.  SURGEON:  Vanita PandaChristopher Y. Magnus IvanBlackman, M.D.  ANESTHESIA:  General.  ESTIMATED BLOOD LOSS:  Less than 50 mL.  COMPLICATIONS:  None.  INDICATIONS:  [name] is a 56 year old diabetic with peripheral vascular disease, who had developed a necrotic wound on his left great toe.  He ended up developing purulence and osteomyelitis and recently had to undergo a 1st ray amputation of his big toe just proximal to the MTP joint.  We have been seeing him in the office since then and the wound has broken down.  He has had exposed bone, but no gross purulence. No evidence of infection.  We have not been able to get him to heal the wound.  He actually has decent blood flow to that foot and he has worked on his blood glucose control now with a hemoglobin A1c of just 7.  At this point, we recommended he undergo a debridement of the 1st ray with shortening the metatarsal further to hopefully get some closure of the wound.  He understands we may amputate the 2nd toe.  We did this on his other foot and he end up healing that wound remotely.  PROCEDURE DESCRIPTION:  After informed consent was obtained, appropriate left foot was marked.  He was brought to the operating room, placed supine  on the operating room table.  General anesthesia was then obtained.  His left foot and ankle were prepped and draped with Betadine scrub and paint.  Time-out was called and he was identified as correct patient and correct left foot.  I then used a #10 blade to sharply excise the skin to ellipse out the old wound and get rid of all the necrotic tissue.  I exposed the remaining portion of the metatarsal and was able to use a Key elevator to back up soft tissue and I used an oscillating saw to make more proximal metatarsal bone cut.  We used a rongeur to remove remaining bone in this area and including removing the sesamoids.  We had good bleeding tissue throughout and there was no gross purulence.  No infection encountered and then we were able to easily bring the skin back together.  We irrigated soft tissue with normal saline solution and then, we reapproximated the skin with interrupted #2 nylon suture with a nice loose closure.  Xeroform and well-padded sterile dressing was applied. He was awakened, extubated, and taken to the recovery  room in stable condition.  All final counts were correct.  There were no complications noted.     Vanita Panda. Magnus Ivan, M.D.     CYB/MEDQ  D:  10/03/2016  T:  10/03/2016  Job:  [id number]

## 2016-10-09 ENCOUNTER — Ambulatory Visit (INDEPENDENT_AMBULATORY_CARE_PROVIDER_SITE_OTHER): Payer: Medicare Other | Admitting: Orthopaedic Surgery

## 2016-10-09 DIAGNOSIS — I96 Gangrene, not elsewhere classified: Secondary | ICD-10-CM

## 2016-10-09 NOTE — Progress Notes (Signed)
The patient is 6 days status post a repeat irrigation and debridement of his left foot first ray.  On examination the foot wound is breaking down and there is malodorous drainage. I cleaned a roll-on place new dry dressing.  We'll start doxycycline and he'll keep office foot today and keep his foot clean and dry. I'll see him back in 2 days to terminal what else may need to be done. Certainly he will likely end up with a transmetatarsal and dictation is the next step.

## 2016-10-11 ENCOUNTER — Ambulatory Visit (INDEPENDENT_AMBULATORY_CARE_PROVIDER_SITE_OTHER): Payer: Medicare Other | Admitting: Physician Assistant

## 2016-10-11 DIAGNOSIS — L97524 Non-pressure chronic ulcer of other part of left foot with necrosis of bone: Secondary | ICD-10-CM

## 2016-10-11 MED ORDER — DOXYCYCLINE HYCLATE 100 MG PO TABS: 100.0000 mg | ORAL_TABLET | Freq: Two times a day (BID) | ORAL | 0 refills | Status: DC

## 2016-10-11 NOTE — Progress Notes (Signed)
Office Visit Note   Patient: Stephen Fitzgerald           Date of Birth: October 27, 1960           MRN: [id number] Visit Date: 10/11/2016              Requested by: Stephen AidAlyssa A Haney, MD [address] [place], [place] [address] PCP: Stephen HecklerHaney,Alyssa, MD   Assessment & Plan: Visit Diagnoses:  1. 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 Again recommended trans-metatarsal amputation of his left foot he did like to consider this and think about it for a while. If he develops any signs of sepsis the hospital immediately. These were symptoms were reviewed with the patient. Questions encouraged and answered. We'll continue his doxycycline. He will continue to do to try dressing changes daily rating get the wound wet in the shower but no soaking  Follow-Up Instructions: No Follow-up on file.   Orders:  No orders of the defined types were placed in this encounter.  Meds ordered this encounter  Medications  . doxycycline (VIBRA-TABS) 100 MG tablet    Sig: Take 1 tablet (100 mg total) by mouth 2 (two) times daily.    Dispense:  14 tablet    Refill:  0      Procedures: No procedures performed   Clinical Data: No additional findings.   Subjective: Postop   HPI Returns today follow-up of his left foot wound. He's had no fevers chills shortness breath. He is on doxycycline. Review of Systems   Objective: Vital Signs: There were no vitals taken for this visit.  Physical Exam  Ortho Exam Foot eschar wound there is fluctuance in this area and no expressible purulence. No exposed bone. He has erythema over the dorsal aspect of the foot involving the lesser toes. There is a new abrasion over the medial aspect second toe over the proximal phalanx region. Malodor of the foot is present. Sutures remain intact. Specialty Comments:  No specialty comments available.  Imaging: No results found.   PMFS  History: Patient Active Problem List   Diagnosis Date Noted  . Status post amputation of left great toe (HCC) 08/31/2016  . 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 Gangrene of toe of left foot (HCC) 08/21/2016  . Osteomyelitis of toe of right foot (HCC) 12/10/2015  . Healthcare maintenance 04/27/2015  . Foot swelling 12/16/2014  . Amputation of left hand ring finger 09/23/2014  . 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 History of osteomyelitis 08/26/2014  . Serum albumin decreased 08/26/2014  . Vitamin B12 deficiency 08/18/2014  . Heart murmur, systolic   . Heart murmur 07/24/2013  . Hyperlipidemia 03/06/2013  . Hypertension 03/06/2013  . Tachycardia 01/13/2013  . Diabetes mellitus type 2 with complications, uncontrolled (HCC) 12/08/2012  . Expressive aphasia syndrome 12/08/2012  . 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 Abscess of hand, left 12/08/2012   left hand with only 3rd to 5th finger, 4th finger to midjoint.- reddened but dry and intact scar line.  . Bacteremia   . Blood poisoning (HCC)   . Diabetes mellitus without complication (HCC)    not usually checking blood sugars daily.  . Flexor tenosynovitis of finger 12/08/2012  . Hypertension   . Stroke (  HCC)    expressive aphasia  . Toe osteomyelitis, right (HCC)    right second toe    No family history on file.  Past Surgical History:  Procedure Laterality Date  . ABDOMINAL AORTAGRAM N/A 08/13/2014   Procedure: ABDOMINAL [name];  Surgeon: Fransisco Hertz, MD;  [facility] C[facility] LAB;  Service: Cardiovascular;  Laterality: N/A;  . AMPUTATION Left 12/06/2012   Procedure: AMPUTATION DIGIT LEFT INDEX;  Surgeon: [name], MD;  Location: [facility] OR;  Service: Orthopedics;  Laterality: Left;  . AMPUTATION Left 08/15/2014   Procedure: AMPUTATION LEFT RING FINGER;  Surgeon: [name], MD;  Location: [facility] OR;  Service:  Orthopedics;  Laterality: Left;  . AMPUTATION Right 08/15/2014   Procedure: AMPUTATION RIGHT FOOT FIRST RAY AMPUTATION AND IRRIGATION AND DEBREDMENT OF RIGHT FOOT ABSCESS;  Surgeon: [name], MD;  Location: [facility] OR;  Service: Orthopedics;  Laterality: Right;  . AMPUTATION Right 12/10/2015   Procedure: AMPUTATION RIGHT 2ND RAY;  Surgeon: [name], MD;  Location: [facility] ORS;  Service: Orthopedics;  Laterality: Right;  . AMPUTATION Left 08/24/2016   Procedure: LEFT GREAT TOE AMPUTATION;  Surgeon: [name], MD;  Location: [facility] ORS;  Service: Orthopedics;  Laterality: Left;  . HERNIA REPAIR     bilat ing age 19months  . I&D EXTREMITY Left 12/04/2012   Procedure: IRRIGATION AND DEBRIDEMENT Left Hand with Ring Removal  times three, Carpal Tunnel , Flexor tendon Synovectomy;  Surgeon: [name], MD;  Location: [facility] OR;  Service: Orthopedics;  Laterality: Left;  . I&D EXTREMITY Left 12/06/2012   Procedure: IRRIGATION AND DEBRIDEMENT EXTREMITY LEFT HAND;  Surgeon: [name], MD;  Location: [facility] OR;  Service: Orthopedics;  Laterality: Left;  . I&D EXTREMITY Left 10/03/2016   Procedure: IRRIGATION AND DEBRIDEMENT LEFT 1ST METATARSAL with Wound Closure;  Surgeon: [name], MD;  Location: [facility] OR;  Service: Orthopedics;  Laterality: Left;  . TENDON REPAIR Left 12/23/2012   Procedure: DIVISION AND INSETTING FLAP TO LEFT RING FINGER ADJACENT TISSUE REARRANGEMENT LEFT INDEX FINGER AMPUTATION SITE;  Surgeon: [name], MD;  Location: [facility];  Service: Orthopedics;  Laterality: Left;   Social History   Occupational History  . Not on file.   Social History Main Topics  . Smoking status: Never Smoker  . Smokeless tobacco: Never Used  . Alcohol use 33.6 oz/week    56 Cans of beer per week     Comment: 8-12 beers daily  . Drug use: No  . Sexual activity: Not on file

## 2016-10-17 ENCOUNTER — Inpatient Hospital Stay (INDEPENDENT_AMBULATORY_CARE_PROVIDER_SITE_OTHER): Payer: Medicare Other | Admitting: Physician Assistant

## 2016-10-18 ENCOUNTER — Ambulatory Visit (INDEPENDENT_AMBULATORY_CARE_PROVIDER_SITE_OTHER): Payer: Medicare Other | Admitting: Physician Assistant

## 2016-10-18 ENCOUNTER — Ambulatory Visit (INDEPENDENT_AMBULATORY_CARE_PROVIDER_SITE_OTHER): Payer: Medicare Other | Admitting: Orthopedic Surgery

## 2016-10-19 ENCOUNTER — Other Ambulatory Visit (INDEPENDENT_AMBULATORY_CARE_PROVIDER_SITE_OTHER): Payer: Self-pay | Admitting: Family

## 2016-10-19 ENCOUNTER — Ambulatory Visit (INDEPENDENT_AMBULATORY_CARE_PROVIDER_SITE_OTHER): Payer: Medicare Other | Admitting: Orthopedic Surgery

## 2016-10-19 ENCOUNTER — Encounter (INDEPENDENT_AMBULATORY_CARE_PROVIDER_SITE_OTHER): Payer: Self-pay | Admitting: Orthopedic Surgery

## 2016-10-19 DIAGNOSIS — T8131XD Disruption of external operation (surgical) wound, not elsewhere classified, subsequent encounter: Secondary | ICD-10-CM | POA: Diagnosis not present

## 2016-10-19 DIAGNOSIS — I96 Gangrene, not elsewhere classified: Secondary | ICD-10-CM

## 2016-10-19 DIAGNOSIS — T8131XA Disruption of external operation (surgical) wound, not elsewhere classified, initial encounter: Secondary | ICD-10-CM | POA: Insufficient documentation

## 2016-10-19 NOTE — Progress Notes (Signed)
Several unsuccessful attempts were made to contact pt. Left voice message on pt home phone with pre-op instructions. Pt made aware of diabetes protocol to not take Glipizide diabetes medication tonight or tomorrow morning and do not take Metformin the morning of surgery,  check BG every 2 hours prior to arrival to hospital, if BG < 70 take 4 glucose tabs, or glucose gel or 4 ounces of apple or cranberry juice, wait 15 minutes and recheck BG, if BG remains < 70, call SS.

## 2016-10-19 NOTE — Progress Notes (Signed)
Office Visit Note   Patient: Stephen Fitzgerald           Date of Birth: 14-Feb-1961           MRN: [id number] Visit Date: 10/19/2016              Requested by: Bonney Aid, MD [address] [place], [place] [address] PCP: Velora Heckler, MD  Chief Complaint  Patient presents with  . Left Foot - Follow-up      HPI: Patient is status post left foot first ray amputation. Patient has had progressive gangrenous changes he has a gangrenous ulcer beneath the fifth metatarsal head black gangrene of the second toe with a black eschar over the surgical incision.  Assessment & Plan: Visit Diagnoses:  1. Postoperative wound dehiscence, subsequent encounter   2. Gangrene of toe of left foot (HCC)     Plan: Patient has a transmetatarsal amputation on the right and a recommended proceeding with a transmetatarsal amputation the left. Discussed the patient has increased risks of the wound not healing potential for transtibial amputation. Patient states he wants to proceed with foot salvage surgery. All questions were answered with the patient and his father. Patient's father does have cancer and is undergoing treatment for this. Patient's father will be his caregiver. Patient and father wish to proceed with outpatient surgery with discharge to home.  Follow-Up Instructions: Return in about 1 week (around 10/26/2016).   Ortho Exam  Patient is alert, oriented, no adenopathy, well-dressed, normal affect, normal respiratory effort. Examination patient is alert and oriented he has an antalgic gait. Examination has black eschar with dehiscence of the surgical wound the eschar area 6 cm x 3 cm. There is black dry gangrenous changes to the second toe as well as a ischemic ulcer beneath the fifth metatarsal head. There is a area of skin breakdown dorsally on the foot and this appears to be due to his postoperative shoe. Patient does have a palpable dorsalis pedis and posterior tibial pulse. The Doppler was  used and he does have triphasic flow at the dorsalis pedis pulse  Imaging: No results found.  Labs: Lab Results  Component Value Date   HGBA1C 7.0 (H) 08/23/2016   HGBA1C 8.9 (H) 12/08/2015   HGBA1C 8.7 11/25/2015   ESRSEDRATE 118 (H) 08/11/2014   ESRSEDRATE 70 (H) 12/03/2012   CRP 24.4 (H) 08/11/2014   REPTSTATUS 08/18/2014 FINAL 08/11/2014   GRAMSTAIN  12/04/2012    NO WBC SEEN NO SQUAMOUS EPITHELIAL CELLS SEEN RARE GRAM POSITIVE COCCI IN PAIRS   CULT  08/11/2014    STAPHYLOCOCCUS SPECIES (COAGULASE NEGATIVE) Note: This organism is presumed to be Clindamycin resistant based on detection of inducible Clindamycin resistance. Note: Gram Stain Report Called to,Read Back By and Verified With: Gae Gallop RN [phone] Performed at [facility]    LABORGA STAPHYLOCOCCUS SPECIES (COAGULASE NEGATIVE) 08/11/2014    Orders:  No orders of the defined types were placed in this encounter.  No orders of the defined types were placed in this encounter.    Procedures: No procedures performed  Clinical Data: No additional findings.  ROS:  All other systems negative, except as noted in the HPI. Review of Systems  Objective: Vital Signs: There were no vitals taken for this visit.  Specialty Comments:  No specialty comments available.  PMFS History: Patient Active Problem List   Diagnosis Date Noted  . Wound dehiscence, surgical 10/19/2016  . Status post amputation of left great toe (HCC)  08/31/2016  . 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 Gangrene of toe of left foot (HCC) 08/21/2016  . Osteomyelitis of toe of right foot (HCC) 12/10/2015  . Healthcare maintenance 04/27/2015  . Foot swelling 12/16/2014  . Amputation of left hand ring finger 09/23/2014  . 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 History of osteomyelitis 08/26/2014  . Serum albumin decreased 08/26/2014  . Vitamin B12 deficiency 08/18/2014  . Heart murmur,  systolic   . Heart murmur 07/24/2013  . Hyperlipidemia 03/06/2013  . Hypertension 03/06/2013  . Tachycardia 01/13/2013  . Diabetes mellitus type 2 with complications, uncontrolled (HCC) 12/08/2012  . Expressive aphasia syndrome 12/08/2012  . 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 Abscess of hand, left 12/08/2012   left hand with only 3rd to 5th finger, 4th finger to midjoint.- reddened but dry and intact scar line.  . Bacteremia   . Blood poisoning (HCC)   . Diabetes mellitus without complication (HCC)    not usually checking blood sugars daily.  . Flexor tenosynovitis of finger 12/08/2012  . Hypertension   . Stroke [facility]    expressive aphasia  . Toe osteomyelitis, right (HCC)    right second toe    History reviewed. No pertinent family history.  Past Surgical History:  Procedure Laterality Date  . ABDOMINAL AORTAGRAM N/A 08/13/2014   Procedure: ABDOMINAL [name];  Surgeon: Fransisco Hertz, MD;  Location: [facility] CATH LAB;  Service: Cardiovascular;  Laterality: N/A;  . AMPUTATION Left 12/06/2012   Procedure: AMPUTATION DIGIT LEFT INDEX;  Surgeon: [name], MD;  [facility] OR;  Service: Orthopedics;  Laterality: Left;  . AMPUTATION Left 08/15/2014   Procedure: AMPUTATION LEFT RING FINGER;  Surgeon: [name], MD;  Location: [facility] OR;  Service: Orthopedics;  Laterality: Left;  . AMPUTATION Right 08/15/2014   Procedure: AMPUTATION RIGHT FOOT FIRST RAY AMPUTATION AND IRRIGATION AND DEBREDMENT OF RIGHT FOOT ABSCESS;  Surgeon: [name], MD;  Location: [facility] OR;  Service: Orthopedics;  Laterality: Right;  . AMPUTATION Right 12/10/2015   Procedure: AMPUTATION RIGHT 2ND RAY;  Surgeon: [name], MD;  Location: [facility] ORS;  Service: Orthopedics;  Laterality: Right;  . AMPUTATION Left 08/24/2016   Procedure: LEFT GREAT TOE AMPUTATION;  Surgeon: [name], MD;  Location: [facility] ORS;  Service:  Orthopedics;  Laterality: Left;  . HERNIA REPAIR     bilat ing age 19months  . I&D EXTREMITY Left 12/04/2012   Procedure: IRRIGATION AND DEBRIDEMENT Left Hand with Ring Removal  times three, Carpal Tunnel , Flexor tendon Synovectomy;  Surgeon: [name], MD;  Location[facility] OR;  Service: Orthopedics;  Laterality: Left;  . I&D EXTREMITY Left 12/06/2012   Procedure: IRRIGATION AND DEBRIDEMENT EXTREMITY LEFT HAND;  Surgeon: [name], MD;  Location: [facility] OR;  Service: Orthopedics;  Laterality: Left;  . I&D EXTREMITY Left 10/03/2016   Procedure: IRRIGATION AND DEBRIDEMENT LEFT 1ST METATARSAL with Wound Closure;  Surgeon: [name], MD;  Location: [facility] OR;  Service: Orthopedics;  Laterality: Left;  . TENDON REPAIR Left 12/23/2012   Procedure: DIVISION AND INSETTING FLAP TO LEFT RING FINGER ADJACENT TISSUE REARRANGEMENT LEFT INDEX FINGER AMPUTATION SITE;  Surgeon: [name], MD;  Location: [facility];  Service: Orthopedics;  Laterality: Left;   Social History   Occupational History  . Not on file.   Social History Main  Topics  . Smoking status: Never Smoker  . Smokeless tobacco: Never Used  . Alcohol use 33.6 oz/week    56 Cans of beer per week     Comment: 8-12 beers daily  . Drug use: No  . Sexual activity: Not on file

## 2016-10-20 ENCOUNTER — Telehealth (INDEPENDENT_AMBULATORY_CARE_PROVIDER_SITE_OTHER): Payer: Self-pay | Admitting: Radiology

## 2016-10-20 ENCOUNTER — Encounter (HOSPITAL_COMMUNITY): Payer: Self-pay

## 2016-10-20 ENCOUNTER — Encounter (HOSPITAL_COMMUNITY)
Admission: RE | Disposition: A | Discharge status: Skilled Nursing Facility | Payer: Self-pay | Source: Ambulatory Visit | Attending: Orthopedic Surgery

## 2016-10-20 ENCOUNTER — Inpatient Hospital Stay (HOSPITAL_COMMUNITY)
Admission: RE | Admit: 2016-10-20 | Discharge: 2016-10-23 | DRG: 240 | Disposition: A | Discharge status: Skilled Nursing Facility | Payer: Medicare Other | Source: Ambulatory Visit | Attending: Orthopedic Surgery | Admitting: Orthopedic Surgery

## 2016-10-20 ENCOUNTER — Ambulatory Visit (HOSPITAL_COMMUNITY): Payer: Medicare Other | Admitting: Anesthesiology

## 2016-10-20 DIAGNOSIS — Z8673 Personal history of transient ischemic attack (TIA), and cerebral infarction without residual deficits: Secondary | ICD-10-CM | POA: Diagnosis not present

## 2016-10-20 DIAGNOSIS — R262 Difficulty in walking, not elsewhere classified: Secondary | ICD-10-CM | POA: Diagnosis not present

## 2016-10-20 DIAGNOSIS — E1140 Type 2 diabetes mellitus with diabetic neuropathy, unspecified: Secondary | ICD-10-CM | POA: Diagnosis present

## 2016-10-20 DIAGNOSIS — Z89432 Acquired absence of left foot: Secondary | ICD-10-CM | POA: Diagnosis not present

## 2016-10-20 DIAGNOSIS — E1165 Type 2 diabetes mellitus with hyperglycemia: Secondary | ICD-10-CM | POA: Diagnosis not present

## 2016-10-20 DIAGNOSIS — Z881 Allergy status to other antibiotic agents status: Secondary | ICD-10-CM

## 2016-10-20 DIAGNOSIS — Z7984 Long term (current) use of oral hypoglycemic drugs: Secondary | ICD-10-CM | POA: Diagnosis not present

## 2016-10-20 DIAGNOSIS — Z7982 Long term (current) use of aspirin: Secondary | ICD-10-CM

## 2016-10-20 DIAGNOSIS — L97509 Non-pressure chronic ulcer of other part of unspecified foot with unspecified severity: Secondary | ICD-10-CM | POA: Diagnosis not present

## 2016-10-20 DIAGNOSIS — M869 Osteomyelitis, unspecified: Secondary | ICD-10-CM | POA: Diagnosis not present

## 2016-10-20 DIAGNOSIS — E11621 Type 2 diabetes mellitus with foot ulcer: Secondary | ICD-10-CM

## 2016-10-20 DIAGNOSIS — D62 Acute posthemorrhagic anemia: Secondary | ICD-10-CM | POA: Diagnosis not present

## 2016-10-20 DIAGNOSIS — T8131XD Disruption of external operation (surgical) wound, not elsewhere classified, subsequent encounter: Secondary | ICD-10-CM | POA: Diagnosis not present

## 2016-10-20 DIAGNOSIS — E1152 Type 2 diabetes mellitus with diabetic peripheral angiopathy with gangrene: Secondary | ICD-10-CM | POA: Diagnosis not present

## 2016-10-20 DIAGNOSIS — Z4789 Encounter for other orthopedic aftercare: Secondary | ICD-10-CM | POA: Diagnosis not present

## 2016-10-20 DIAGNOSIS — R278 Other lack of coordination: Secondary | ICD-10-CM | POA: Diagnosis not present

## 2016-10-20 DIAGNOSIS — E785 Hyperlipidemia, unspecified: Secondary | ICD-10-CM | POA: Diagnosis not present

## 2016-10-20 DIAGNOSIS — M6281 Muscle weakness (generalized): Secondary | ICD-10-CM | POA: Diagnosis not present

## 2016-10-20 DIAGNOSIS — I10 Essential (primary) hypertension: Secondary | ICD-10-CM | POA: Diagnosis present

## 2016-10-20 DIAGNOSIS — R4701 Aphasia: Secondary | ICD-10-CM | POA: Diagnosis not present

## 2016-10-20 DIAGNOSIS — S98919D Complete traumatic amputation of unspecified foot, level unspecified, subsequent encounter: Secondary | ICD-10-CM | POA: Diagnosis not present

## 2016-10-20 DIAGNOSIS — R011 Cardiac murmur, unspecified: Secondary | ICD-10-CM | POA: Diagnosis not present

## 2016-10-20 HISTORY — DX: Unspecified osteoarthritis, unspecified site: M19.90

## 2016-10-20 HISTORY — PX: AMPUTATION: SHX166

## 2016-10-20 HISTORY — PX: TRANSMETATARSAL AMPUTATION: SHX6197

## 2016-10-20 HISTORY — PX: APPLICATION OF WOUND VAC: SHX5189

## 2016-10-20 HISTORY — DX: Cardiac murmur, unspecified: R01.1

## 2016-10-20 LAB — BASIC METABOLIC PANEL
ANION GAP: 11 (ref 5–15)
BUN: 16 mg/dL (ref 6–20)
CO2: 22 mmol/L (ref 22–32)
Calcium: 9.4 mg/dL (ref 8.9–10.3)
Chloride: 107 mmol/L (ref 101–111)
Creatinine, Ser: 0.93 mg/dL (ref 0.61–1.24)
GFR calc Af Amer: >60 mL/min (ref >60)
Glucose, Bld: 138 mg/dL — ABNORMAL HIGH (ref 65–99)
POTASSIUM: 4.7 mmol/L (ref 3.5–5.1)
Sodium: 140 mmol/L (ref 135–145)

## 2016-10-20 LAB — CBC
HCT: 34.1 % — ABNORMAL LOW (ref 39.0–52.0)
HEMOGLOBIN: 10.8 g/dL — AB (ref 13.0–17.0)
MCH: 26.9 pg (ref 26.0–34.0)
MCHC: 31.7 g/dL (ref 30.0–36.0)
MCV: 84.8 fL (ref 78.0–100.0)
PLATELETS: 447 10*3/uL — AB (ref 150–400)
RBC: 4.02 MIL/uL — AB (ref 4.22–5.81)
RDW: 14.2 % (ref 11.5–15.5)
WBC: 11.3 10*3/uL — AB (ref 4.0–10.5)

## 2016-10-20 LAB — GLUCOSE, CAPILLARY
Glucose-Capillary: 134 mg/dL — ABNORMAL HIGH (ref 65–99)
Glucose-Capillary: 108 mg/dL — ABNORMAL HIGH (ref 65–99)

## 2016-10-20 SURGERY — AMPUTATION, FOOT, PARTIAL
Anesthesia: General | Laterality: Left

## 2016-10-20 MED ORDER — ASPIRIN EC 325 MG PO TBEC
325.0000 mg | DELAYED_RELEASE_TABLET | Freq: Every day | ORAL | Status: DC
  Administered 2016-10-21 – 2016-10-23 (×3): 325 mg via ORAL
  Filled 2016-10-20 (×3): qty 1

## 2016-10-20 MED ORDER — GLIPIZIDE 5 MG PO TABS
5.0000 mg | ORAL_TABLET | Freq: Two times a day (BID) | ORAL | Status: DC
  Administered 2016-10-21 – 2016-10-23 (×6): 5 mg via ORAL
  Filled 2016-10-20 (×6): qty 1

## 2016-10-20 MED ORDER — ONDANSETRON HCL 4 MG/2ML IJ SOLN: 4.0000 mg | Freq: Four times a day (QID) | INTRAMUSCULAR | Status: DC | PRN

## 2016-10-20 MED ORDER — HYDROMORPHONE HCL 1 MG/ML IJ SOLN
0.2500 mg | INTRAMUSCULAR | Status: DC | PRN
Start: 2016-10-20 — End: 2016-10-20
  Administered 2016-10-20: 0.5 mg via INTRAVENOUS

## 2016-10-20 MED ORDER — METOPROLOL SUCCINATE ER 50 MG PO TB24
50.0000 mg | ORAL_TABLET | Freq: Every day | ORAL | Status: DC
  Administered 2016-10-21 – 2016-10-23 (×3): 50 mg via ORAL
  Filled 2016-10-20 (×3): qty 1

## 2016-10-20 MED ORDER — DOCUSATE SODIUM 100 MG PO CAPS
100.0000 mg | ORAL_CAPSULE | Freq: Two times a day (BID) | ORAL | Status: DC
  Administered 2016-10-20 – 2016-10-23 (×6): 100 mg via ORAL
  Filled 2016-10-20 (×6): qty 1

## 2016-10-20 MED ORDER — METOCLOPRAMIDE HCL 5 MG/ML IJ SOLN: 5.0000 mg | Freq: Three times a day (TID) | INTRAMUSCULAR | Status: DC | PRN

## 2016-10-20 MED ORDER — MIDAZOLAM HCL 2 MG/2ML IJ SOLN
INTRAMUSCULAR | Status: AC
  Filled 2016-10-20: qty 2

## 2016-10-20 MED ORDER — ONDANSETRON HCL 4 MG PO TABS: 4.0000 mg | ORAL_TABLET | Freq: Four times a day (QID) | ORAL | Status: DC | PRN

## 2016-10-20 MED ORDER — METHOCARBAMOL 1000 MG/10ML IJ SOLN
500.0000 mg | Freq: Four times a day (QID) | INTRAVENOUS | Status: DC | PRN
  Filled 2016-10-20: qty 5

## 2016-10-20 MED ORDER — PROPOFOL 10 MG/ML IV BOLUS
INTRAVENOUS | Status: DC | PRN
  Administered 2016-10-20: 50 mg via INTRAVENOUS
  Administered 2016-10-20: 150 mg via INTRAVENOUS

## 2016-10-20 MED ORDER — MIDAZOLAM HCL 5 MG/5ML IJ SOLN
INTRAMUSCULAR | Status: DC | PRN
  Administered 2016-10-20: 2 mg via INTRAVENOUS

## 2016-10-20 MED ORDER — INSULIN ASPART 100 UNIT/ML ~~LOC~~ SOLN
0.0000 [IU] | Freq: Three times a day (TID) | SUBCUTANEOUS | Status: DC
  Administered 2016-10-21: 5 [IU] via SUBCUTANEOUS
  Administered 2016-10-21: 2 [IU] via SUBCUTANEOUS
  Administered 2016-10-22: 5 [IU] via SUBCUTANEOUS
  Administered 2016-10-22: 2 [IU] via SUBCUTANEOUS
  Administered 2016-10-23: 8 [IU] via SUBCUTANEOUS

## 2016-10-20 MED ORDER — METFORMIN HCL ER 500 MG PO TB24
500.0000 mg | ORAL_TABLET | Freq: Two times a day (BID) | ORAL | Status: DC
  Administered 2016-10-21 – 2016-10-23 (×5): 500 mg via ORAL
  Filled 2016-10-20 (×5): qty 1

## 2016-10-20 MED ORDER — CLINDAMYCIN PHOSPHATE 900 MG/50ML IV SOLN
INTRAVENOUS | Status: AC
  Filled 2016-10-20: qty 50

## 2016-10-20 MED ORDER — ACETAMINOPHEN 650 MG RE SUPP: 650.0000 mg | Freq: Four times a day (QID) | RECTAL | Status: DC | PRN

## 2016-10-20 MED ORDER — CHLORHEXIDINE GLUCONATE 4 % EX LIQD: 60.0000 mL | Freq: Once | CUTANEOUS | Status: DC

## 2016-10-20 MED ORDER — MEPERIDINE HCL 25 MG/ML IJ SOLN: 6.2500 mg | INTRAMUSCULAR | Status: DC | PRN

## 2016-10-20 MED ORDER — HYDROMORPHONE HCL 1 MG/ML IJ SOLN
INTRAMUSCULAR | Status: AC
  Filled 2016-10-20: qty 0.5

## 2016-10-20 MED ORDER — ACETAMINOPHEN 325 MG PO TABS
650.0000 mg | ORAL_TABLET | Freq: Four times a day (QID) | ORAL | Status: DC | PRN
  Administered 2016-10-23: 650 mg via ORAL
  Filled 2016-10-20: qty 2

## 2016-10-20 MED ORDER — HYDROMORPHONE HCL 1 MG/ML IJ SOLN
1.0000 mg | INTRAMUSCULAR | Status: DC | PRN
  Administered 2016-10-21 (×2): 1 mg via INTRAVENOUS
  Filled 2016-10-20 (×2): qty 1

## 2016-10-20 MED ORDER — FENTANYL CITRATE (PF) 100 MCG/2ML IJ SOLN
INTRAMUSCULAR | Status: DC | PRN
  Administered 2016-10-20 (×2): 50 ug via INTRAVENOUS

## 2016-10-20 MED ORDER — FENTANYL CITRATE (PF) 250 MCG/5ML IJ SOLN
INTRAMUSCULAR | Status: AC
  Filled 2016-10-20: qty 5

## 2016-10-20 MED ORDER — 0.9 % SODIUM CHLORIDE (POUR BTL) OPTIME
TOPICAL | Status: DC | PRN
  Administered 2016-10-20: 1000 mL

## 2016-10-20 MED ORDER — LACTATED RINGERS IV SOLN
INTRAVENOUS | Status: DC
  Administered 2016-10-20 (×2): via INTRAVENOUS

## 2016-10-20 MED ORDER — MAGNESIUM CITRATE PO SOLN
1.0000 | Freq: Once | ORAL | Status: AC | PRN
  Administered 2016-10-23: 1 via ORAL
  Filled 2016-10-20: qty 296

## 2016-10-20 MED ORDER — ATORVASTATIN CALCIUM 40 MG PO TABS
40.0000 mg | ORAL_TABLET | Freq: Every day | ORAL | Status: DC
  Administered 2016-10-21 – 2016-10-23 (×3): 40 mg via ORAL
  Filled 2016-10-20 (×3): qty 1

## 2016-10-20 MED ORDER — OXYCODONE HCL 5 MG PO TABS
5.0000 mg | ORAL_TABLET | ORAL | Status: DC | PRN
  Administered 2016-10-21 (×2): 10 mg via ORAL
  Administered 2016-10-23 (×2): 5 mg via ORAL
  Filled 2016-10-20: qty 1
  Filled 2016-10-20 (×2): qty 2
  Filled 2016-10-20: qty 1

## 2016-10-20 MED ORDER — SODIUM CHLORIDE 0.9 % IV SOLN: INTRAVENOUS | Status: DC

## 2016-10-20 MED ORDER — METOCLOPRAMIDE HCL 5 MG/ML IJ SOLN: 10.0000 mg | Freq: Once | INTRAMUSCULAR | Status: DC | PRN

## 2016-10-20 MED ORDER — LIDOCAINE HCL (CARDIAC) 20 MG/ML IV SOLN
INTRAVENOUS | Status: DC | PRN
  Administered 2016-10-20: 100 mg via INTRAVENOUS

## 2016-10-20 MED ORDER — CLINDAMYCIN PHOSPHATE 900 MG/50ML IV SOLN
900.0000 mg | INTRAVENOUS | Status: AC
  Administered 2016-10-20: 900 mg via INTRAVENOUS

## 2016-10-20 MED ORDER — METHOCARBAMOL 500 MG PO TABS
500.0000 mg | ORAL_TABLET | Freq: Four times a day (QID) | ORAL | Status: DC | PRN
  Administered 2016-10-21: 500 mg via ORAL
  Filled 2016-10-20: qty 1

## 2016-10-20 MED ORDER — POLYETHYLENE GLYCOL 3350 17 G PO PACK: 17.0000 g | PACK | Freq: Every day | ORAL | Status: DC | PRN

## 2016-10-20 MED ORDER — BISACODYL 10 MG RE SUPP: 10.0000 mg | Freq: Every day | RECTAL | Status: DC | PRN

## 2016-10-20 MED ORDER — CLINDAMYCIN PHOSPHATE 600 MG/50ML IV SOLN
600.0000 mg | Freq: Four times a day (QID) | INTRAVENOUS | Status: AC
  Administered 2016-10-20 – 2016-10-21 (×3): 600 mg via INTRAVENOUS
  Filled 2016-10-20 (×3): qty 50

## 2016-10-20 MED ORDER — METOCLOPRAMIDE HCL 5 MG PO TABS: 5.0000 mg | ORAL_TABLET | Freq: Three times a day (TID) | ORAL | Status: DC | PRN

## 2016-10-20 SURGICAL SUPPLY — 34 items
BENZOIN TINCTURE PRP APPL 2/3 (GAUZE/BANDAGES/DRESSINGS) ×3 IMPLANT
BLADE SAW SGTL HD 18.5X60.5X1. (BLADE) ×3 IMPLANT
BLADE SURG 21 STRL SS (BLADE) ×3 IMPLANT
BNDG COHESIVE 4X5 TAN STRL (GAUZE/BANDAGES/DRESSINGS) IMPLANT
BNDG GAUZE ELAST 4 BULKY (GAUZE/BANDAGES/DRESSINGS) IMPLANT
COVER SURGICAL LIGHT HANDLE (MISCELLANEOUS) ×3 IMPLANT
DRAPE INCISE IOBAN 66X45 STRL (DRAPES) ×3 IMPLANT
DRAPE U-SHAPE 47X51 STRL (DRAPES) ×3 IMPLANT
DRSG ADAPTIC 3X8 NADH LF (GAUZE/BANDAGES/DRESSINGS) IMPLANT
DRSG PAD ABDOMINAL 8X10 ST (GAUZE/BANDAGES/DRESSINGS) IMPLANT
DURAPREP 26ML APPLICATOR (WOUND CARE) ×3 IMPLANT
ELECT REM PT RETURN 9FT ADLT (ELECTROSURGICAL) ×3
ELECTRODE REM PT RTRN 9FT ADLT (ELECTROSURGICAL) ×1 IMPLANT
GAUZE SPONGE 4X4 12PLY STRL (GAUZE/BANDAGES/DRESSINGS) IMPLANT
GLOVE BIOGEL PI IND STRL 9 (GLOVE) ×1 IMPLANT
GLOVE BIOGEL PI INDICATOR 9 (GLOVE) ×2
GLOVE SURG ORTHO 9.0 STRL STRW (GLOVE) ×3 IMPLANT
GOWN STRL REUS W/ TWL XL LVL3 (GOWN DISPOSABLE) ×3 IMPLANT
GOWN STRL REUS W/TWL XL LVL3 (GOWN DISPOSABLE) ×6
KIT BASIN OR (CUSTOM PROCEDURE TRAY) ×3 IMPLANT
KIT PREVENA INCISION MGT20CM45 (CANNISTER) ×3 IMPLANT
KIT ROOM TURNOVER OR (KITS) ×3 IMPLANT
NS IRRIG 1000ML POUR BTL (IV SOLUTION) ×3 IMPLANT
PACK ORTHO EXTREMITY (CUSTOM PROCEDURE TRAY) ×3 IMPLANT
PAD ARMBOARD 7.5X6 YLW CONV (MISCELLANEOUS) ×6 IMPLANT
SPONGE LAP 18X18 X RAY DECT (DISPOSABLE) IMPLANT
SUT ETHILON 2 0 PSLX (SUTURE) ×9 IMPLANT
SUT VIC AB 2-0 CTB1 (SUTURE) IMPLANT
TOWEL OR 17X24 6PK STRL BLUE (TOWEL DISPOSABLE) ×3 IMPLANT
TOWEL OR 17X26 10 PK STRL BLUE (TOWEL DISPOSABLE) ×3 IMPLANT
TUBE CONNECTING 12'X1/4 (SUCTIONS) ×1
TUBE CONNECTING 12X1/4 (SUCTIONS) ×2 IMPLANT
WATER STERILE IRR 1000ML POUR (IV SOLUTION) ×3 IMPLANT
YANKAUER SUCT BULB TIP NO VENT (SUCTIONS) ×3 IMPLANT

## 2016-10-20 NOTE — Op Note (Signed)
     Date of Surgery: 10/20/2016  INDICATIONS: Mr. 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 Patient has had progressive gangrenous dehiscence and wishes to proceed with foot salvage intervention.Marland Kitchen  PREOPERATIVE DIAGNOSIS: Gangrene left forefoot  POSTOPERATIVE DIAGNOSIS: Same.  PROCEDURE: Transmetatarsal amputation Application of Prevena wound VAC  SURGEON: Lajoyce Corners, M.D.  ANESTHESIA:  general  IV FLUIDS AND URINE: See anesthesia.  ESTIMATED BLOOD LOSS: Minimal mL.  COMPLICATIONS: None.  DESCRIPTION OF PROCEDURE: The patient was brought to the operating room and underwent a general anesthetic. After adequate levels of anesthesia were obtained patient's lower extremity was prepped using DuraPrep draped into a sterile field. A timeout was called.  A fishmouth incision was made just proximal to the ulcerative nonviable tissue. This was carried sharply down to bone. A oscillating saw was used to perform a transmetatarsal amputation with a gentle cascade of the metatarsals and beveled plantarly. Electrocautery was used for hemostasis. The wound was irrigated with normal saline. The incision was closed using 2-0 nylon. A Prevena wound VAC was applied. This had a good suction fit. Patient was taken to the PACU in stable condition.  Aldean Baker, MD [facility] 7:18 PM

## 2016-10-20 NOTE — Telephone Encounter (Signed)
Patients father calling today having concerns about patient post operatively. He wants to ensure that patient will go to SNF post op. I advised that I had spoken with Elnita Maxwell, and yes Dr. Lajoyce Corners will do whatever is necessary for patient to go to SNF after surgery. He will be admitted at least one night. Will do whatever insurance requires him for placement. I have texted Dr. Lajoyce Corners patients father's contact information for social worker to contact him. Advised that I do not know when social worker would be contacting him. He expressed understanding.

## 2016-10-20 NOTE — H&P (Signed)
Stephen Fitzgerald is an 56 y.o. male.   Chief Complaint: Gangrene left forefoot status post first ray amputation HPI: Patient is a 56 year old gentleman with diabetic insensate neuropathy who is status post first ray amputation left foot. Patient has had progressive gangrenous changes and presents at this time for foot salvage for transmetatarsal amputation.  Past Medical History:  Diagnosis Date  . Abscess of hand, left 12/08/2012   left hand with only 3rd to 5th finger, 4th finger to midjoint.- reddened but dry and intact scar line.  . Bacteremia   . Blood poisoning (HCC)   . Diabetes mellitus without complication (HCC)    not usually checking blood sugars daily.  . Flexor tenosynovitis of finger 12/08/2012  . Hypertension   . Stroke [name])    expressive aphasia  . Toe osteomyelitis, right (HCC)    right second toe    Past Surgical History:  Procedure Laterality Date  . ABDOMINAL AORTAGRAM N/A 08/13/2014   Procedure: ABDOMINAL [name];  Surgeon: Fransisco Hertz, MD;  Location: [facility] CATH LAB;  Service: Cardiovascular;  Laterality: N/A;  . AMPUTATION Left 12/06/2012   Procedure: AMPUTATION DIGIT LEFT INDEX;  Surgeon: [name], MD;  Location: [facility] OR;  Service: Orthopedics;  Laterality: Left;  . AMPUTATION Left 08/15/2014   Procedure: AMPUTATION LEFT RING FINGER;  Surgeon: [name], MD;  Location: [facility] OR;  Service: Orthopedics;  Laterality: Left;  . AMPUTATION Right 08/15/2014   Procedure: AMPUTATION RIGHT FOOT FIRST RAY AMPUTATION AND IRRIGATION AND DEBREDMENT OF RIGHT FOOT ABSCESS;  Surgeon: [name], MD;  Location: [facility] OR;  Service: Orthopedics;  Laterality: Right;  . AMPUTATION Right 12/10/2015   Procedure: AMPUTATION RIGHT 2ND RAY;  Surgeon: [name], MD;  Location: [facility] ORS;  Service: Orthopedics;  Laterality: Right;  . AMPUTATION Left 08/24/2016   Procedure: LEFT GREAT TOE AMPUTATION;  Surgeon: [name], MD;  Location: [facility] ORS;   Service: Orthopedics;  Laterality: Left;  . HERNIA REPAIR     bilat ing age 18months  . I&D EXTREMITY Left 12/04/2012   Procedure: IRRIGATION AND DEBRIDEMENT Left Hand with Ring Removal  times three, Carpal Tunnel , Flexor tendon Synovectomy;  Surgeon: [name], MD;  Location: [facility] OR;  Service: Orthopedics;  Laterality: Left;  . I&D EXTREMITY Left 12/06/2012   Procedure: IRRIGATION AND DEBRIDEMENT EXTREMITY LEFT HAND;  Surgeon: [name], MD;  Location: [facility];  Service: Orthopedics;  Laterality: Left;  . I&D EXTREMITY Left 10/03/2016   Procedure: IRRIGATION AND DEBRIDEMENT LEFT 1ST METATARSAL with Wound Closure;  Surgeon: [name], MD;  Location: [facility] OR;  Service: Orthopedics;  Laterality: Left;  . TENDON REPAIR Left 12/23/2012   Procedure: DIVISION AND INSETTING FLAP TO LEFT RING FINGER ADJACENT TISSUE REARRANGEMENT LEFT INDEX FINGER AMPUTATION SITE;  Surgeon: [name], MD;  Location: [facility];  Service: Orthopedics;  Laterality: Left;    No family history on file. Social History:  reports that he has never smoked. He has never used smokeless tobacco. He reports that he drinks about 33.6 oz of alcohol per week . He reports that he does not use drugs.  Allergies:  Allergies  Allergen Reactions  . Unasyn [Ampicillin-Sulbactam Sodium] Rash    Has patient had a PCN reaction causing immediate rash, facial/tongue/throat swelling, SOB or lightheadedness with hypotension: No Has patient had a PCN reaction causing severe rash involving mucus membranes or skin necrosis: No Has patient had a PCN reaction that  required hospitalization: No Has patient had a PCN reaction occurring within the last 10 years: No If all of the above answers are "NO", then may proceed with Cephalosporin use.     No prescriptions prior to admission.    No results found for this or any previous visit (from the past 48 hour(s)). No results found.  Review of Systems  All  other systems reviewed and are negative.   There were no vitals taken for this visit. Physical Exam  On examination patient is alert oriented no adenopathy well-dressed normal affect was transferred he does have a palpable dorsalis pedis pulse. Examination patient has progression of the gangrene of the first ray he has gangrenous changes beneath the fifth metatarsal head. He has an abrasion dorsally of the foot from wearing his postop shoe Assessment/Plan Assessment: Diabetic insensate neuropathy with gangrene of the first ray amputation left foot.  Plan: We will plan for foot salvage intervention for transmetatarsal amputation. Discussed that with the advanced gangrenous changes of the forefoot patient is at increased risk of the wound not healing for transmetatarsal amputation. Discussed with the safest option would be a transtibial amputation. Patient states he understands wishes to proceed with foot salvage intervention.  [name], MD 10/20/2016, 7:06 AM

## 2016-10-20 NOTE — Transfer of Care (Signed)
Immediate Anesthesia Transfer of Care Note  Patient: Stephen Fitzgerald  Procedure(s) Performed: Procedure(s): Left Transmetatarsal Amputation, Application of Wound VAC (Left)  Patient Location: PACU  Anesthesia Type:General  Level of Consciousness: awake, alert  and oriented  Airway & Oxygen Therapy: Patient Spontanous Breathing  Post-op Assessment: Report given to RN and Post -op Vital signs reviewed and stable  Post vital signs: Reviewed and stable  Last Vitals:  Vitals:   10/20/16 1358 10/20/16 1927  BP: (!) 145/82 (!) 138/93  Pulse: (!) 109   Resp: 16   Temp: 37.3 C 36.5 C    Last Pain:  Vitals:   10/20/16 1927  TempSrc:   PainSc: 0-No pain         Complications: No apparent anesthesia complications

## 2016-10-20 NOTE — Anesthesia Procedure Notes (Signed)
Procedure Name: LMA Insertion Date/Time: 10/20/2016 6:53 PM Performed by: Gwenyth Allegra Pre-anesthesia Checklist: Patient identified, Emergency Drugs available, Suction available, Patient being monitored and Timeout performed Patient Re-evaluated:Patient Re-evaluated prior to inductionOxygen Delivery Method: Circle system utilized Preoxygenation: Pre-oxygenation with 100% oxygen Intubation Type: IV induction LMA: LMA inserted LMA Size: 5.0 Number of attempts: 1 Tube secured with: Tape Dental Injury: Teeth and Oropharynx as per pre-operative assessment

## 2016-10-20 NOTE — Anesthesia Preprocedure Evaluation (Addendum)
Anesthesia Evaluation  Patient identified by MRN, date of birth, ID band Patient awake    Reviewed: Allergy & Precautions, NPO status , Patient's Chart, lab work & pertinent test results, reviewed documented beta blocker date and time   History of Anesthesia Complications Negative for: history of anesthetic complications  Airway Mallampati: I  TM Distance: >3 FB Neck ROM: Full    Dental  (+) Teeth Intact, Dental Advisory Given   Pulmonary neg pulmonary ROS,    Pulmonary exam normal breath sounds clear to auscultation       Cardiovascular hypertension, Pt. on medications + Valvular Problems/Murmurs AS  Rhythm:Regular Rate:Normal + Systolic murmurs Study Conclusions  - Left ventricle: The cavity size was normal. Wall thickness was normal. Systolic function was normal. The estimated ejection fraction was in the range of 55% to 60%. Wall motion was normal; there were no regional wall motion abnormalities. Doppler parameters are consistent with abnormal left ventricular relaxation (grade 1 diastolic dysfunction). - Aortic valve: There was mild stenosis. Valve area (VTI): 1.35 cm^2. Valve area (Vmax): 1.18 cm^2. Valve area (Vmean): 1.16 cm^2.   Neuro/Psych Weakness on right side Difficulty with speech  CVA, Residual Symptoms negative psych ROS   GI/Hepatic negative GI ROS, Neg liver ROS,   Endo/Other  diabetes, Poorly Controlled, Type 2, Oral Hypoglycemic AgentsMorbid obesityHyperlipidemia  Renal/GU negative Renal ROS  negative genitourinary   Musculoskeletal  (+) Arthritis , Osteoarthritis,  Gangrene left foot   Abdominal   Peds  Hematology  (+) anemia ,   Anesthesia Other Findings   Reproductive/Obstetrics                            Anesthesia Physical  Anesthesia Plan  ASA: III  Anesthesia Plan: General   Post-op Pain Management:    Induction: Intravenous  Airway  Management Planned: Oral ETT and LMA  Additional Equipment: None  Intra-op Plan:   Post-operative Plan: Extubation in OR  Informed Consent: I have reviewed the patients History and Physical, chart, labs and discussed the procedure including the risks, benefits and alternatives for the proposed anesthesia with the patient or authorized representative who has indicated his/her understanding and acceptance.   Dental advisory given  Plan Discussed with: CRNA, Surgeon and Anesthesiologist  Anesthesia Plan Comments:         Anesthesia Quick Evaluation

## 2016-10-20 NOTE — Progress Notes (Signed)
Patient and his father feel that its medically necessary for patient to be discharged to skilled nursing. Plan for discharge to skilled nursing on Monday. Patient has multiple medical problems including diabetes stroke unable to ambulate independently.

## 2016-10-21 LAB — GLUCOSE, CAPILLARY
GLUCOSE-CAPILLARY: 112 mg/dL — AB (ref 65–99)
GLUCOSE-CAPILLARY: 239 mg/dL — AB (ref 65–99)
Glucose-Capillary: 150 mg/dL — ABNORMAL HIGH (ref 65–99)
Glucose-Capillary: 110 mg/dL — ABNORMAL HIGH (ref 65–99)

## 2016-10-21 NOTE — Progress Notes (Signed)
   Subjective:  Patient reports pain as moderate.    Objective:   VITALS:   Vitals:   10/20/16 2027 10/20/16 2042 10/21/16 0100 10/21/16 0520  BP:  133/81 129/78 128/79  Pulse:  (!) 107 98 (!) 102  Resp:  Temp: 98.3 F (36.8 C) 98.8 F (37.1 C) 98.6 F (37 C) 98.7 F (37.1 C)  TempSrc:  Oral Oral Oral  SpO2:  96% 98% 97%  Weight:        VAC with good seal and suction, minimal drainage   Lab Results  Component Value Date   WBC 11.3 (H) 10/20/2016   HGB 10.8 (L) 10/20/2016   HCT 34.1 (L) 10/20/2016   MCV 84.8 10/20/2016   PLT 447 (H) 10/20/2016     Assessment/Plan:  1 Day Post-Op   - Expected postop acute blood loss anemia - will monitor for symptoms - Up with PT/OT - DVT ppx - SCDs, ambulation, aspirin - NWB operative extremity - Pain controlled - Discharge planning - will need SNF placement  Glee Arvin 10/21/2016, 8:29 AM [phone]

## 2016-10-21 NOTE — Progress Notes (Addendum)
Physical Therapy Evaluation Patient Details Name: Stephen Fitzgerald MRN: [id number] DOB: November 07, 1960 Today's Date: 10/21/2016   History of Present Illness  Patient S/P trans metatarsel amputation on 10/20/2016. He currently has a wound vac. PMH: includes stroke, HTN, DMII, heart murmur, OA ,   Clinical Impression  Patient presents with difficulty transferring 2nd to NWB status on the left. It was difficult to get a baseline on his mobility 2nd to expressive difficulty. He appears to live on his own possibly in an ALF. He would benefit from rehab at a SNF to improve mobility. Acute therapy will continue to work with the patient.     Follow Up Recommendations SNF    Equipment Recommendations  Rolling walker with 5" wheels    Recommendations for Other Services       Precautions / Restrictions Precautions Precautions: Fall Required Braces or Orthoses: Other Brace/Splint Other Brace/Splint: surgical shoe Restrictions Weight Bearing Restrictions: Yes LLE Weight Bearing: Non weight bearing      Mobility  Bed Mobility Overal bed mobility: Needs Assistance Bed Mobility: Supine to Sit     Supine to sit: Mod assist     General bed mobility comments: mod a to the edge of the bed   Transfers Overall transfer level: Needs assistance Equipment used: Rolling walker (2 wheeled) Transfers: Sit to/from Stand Sit to Stand: Max assist;+2 physical assistance         General transfer comment: Max a to stand and pivot to the chair. Max cuing for weight bearing status on the left but able to follow percuations. Unable to take a step. Patient able to stand for 30 seconds with mod a to urinate with PT holding urinal.   Ambulation/Gait                Stairs            Wheelchair Mobility    Modified Rankin (Stroke Patients Only)       Balance                                             Pertinent Vitals/Pain Pain Assessment: Faces Faces Pain Scale:  Hurts whole lot Pain Location: left foot Pain Descriptors / Indicators: Aching Pain Intervention(s): Limited activity within patient's tolerance;Monitored during session;Patient requesting pain meds-RN notified;Repositioned    Home Living Family/patient expects to be discharged to:: Private residence Living Arrangements: Alone               Additional Comments: Some difficulty understanding exactlly were the patient lives 2nd to communication difficultys. He lives in an apartment by himself. He may be in an ALF. From what he says itsounds like his apartment is handicapped acsessable.     Prior Function Level of Independence: Needs assistance         Comments: He reports someone heps him stand and go to the bathroom otherwise he sits.      Hand Dominance        Extremity/Trunk Assessment        Lower Extremity Assessment Lower Extremity Assessment: LLE deficits/detail LLE: Unable to fully assess due to pain       Communication   Communication: Expressive difficulties  Cognition Arousal/Alertness: Awake/alert Behavior During Therapy: Anxious Overall Cognitive Status: Within Functional Limits for tasks assessed  General Comments: Has expressive difficulty but still does well with communication. Apears to understand all questions and commands.       General Comments      Exercises     Assessment/Plan    PT Assessment Patient needs continued PT services  PT Problem List Decreased strength;Decreased activity tolerance;Decreased mobility;Decreased balance;Decreased knowledge of use of DME;Decreased safety awareness;Decreased knowledge of precautions;Pain       PT Treatment Interventions      PT Goals (Current goals can be found in the Care Plan section)  Acute Rehab PT Goals Patient Stated Goal: difficulty to assess 2nd to aphasia  PT Goal Formulation: With patient Time For Goal Achievement:  10/21/16 Potential to Achieve Goals: Good    Frequency Min 5X/week   Barriers to discharge   difficult to assess 2nd to expresive difficulty     Co-evaluation               End of Session Equipment Utilized During Treatment: Gait belt Activity Tolerance: Patient limited by pain Patient left: in chair;with call bell/phone within reach;with chair alarm set Nurse Communication: Mobility status;Patient requests pain meds PT Visit Diagnosis: Muscle weakness (generalized) (M62.81);Unsteadiness on feet (R26.81);Pain Pain - Right/Left: Left Pain - part of body: Ankle and joints of foot    Time: 0937-1000 PT Time Calculation (min) (ACUTE ONLY): 23 min   Charges:   PT Evaluation $PT Eval Moderate Complexity: 1 Procedure     PT G Codes:        Dessie Coma PT DPT  10/21/2016, 3:52 PM

## 2016-10-21 NOTE — Progress Notes (Signed)
Orthopedic Tech Progress Note Patient Details:  Stephen Fitzgerald 19-Dec-1960 [id number]  Ortho Devices Type of Ortho Device: Postop shoe/boot Ortho Device/Splint Interventions: Application   Saul Fordyce 10/21/2016, 12:14 PM

## 2016-10-22 LAB — GLUCOSE, CAPILLARY
Glucose-Capillary: 101 mg/dL — ABNORMAL HIGH (ref 65–99)
Glucose-Capillary: 239 mg/dL — ABNORMAL HIGH (ref 65–99)
Glucose-Capillary: 141 mg/dL — ABNORMAL HIGH (ref 65–99)
Glucose-Capillary: 113 mg/dL — ABNORMAL HIGH (ref 65–99)

## 2016-10-22 NOTE — NC FL2 (Signed)
[place] MEDICAID FL2 LEVEL OF CARE SCREENING TOOL     IDENTIFICATION  Patient Name: Stephen Fitzgerald Birthdate: [birth year] Sex: male Admission Date (Current Location): 10/20/2016  [facility] and [place] Number:  Producer, television/film/video and Address:  [facility], [address] [place], [place] [address]      Provider Number: [id number]  Attending Physician Name and Address:  [name], MD  Relative Name and Phone Number:       Current Level of Care: Hospital Recommended Level of Care: Skilled Nursing Facility Prior Approval Number:    Date Approved/Denied:   PASRR Number: [id number]  Discharge Plan: SNF    Current Diagnoses: Patient Active Problem List   Diagnosis Date Noted  . Status post transmetatarsal amputation of left foot (HCC) 10/20/2016  . Wound dehiscence, surgical 10/19/2016  . Status post amputation of left great toe (HCC) 08/31/2016  . 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 Gangrene of toe of left foot (HCC) 08/21/2016  . Osteomyelitis of toe of right foot (HCC) 12/10/2015  . Healthcare maintenance 04/27/2015  . Foot swelling 12/16/2014  . Amputation of left hand ring finger 09/23/2014  . 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 History of osteomyelitis 08/26/2014  . Serum albumin decreased 08/26/2014  . Vitamin B12 deficiency 08/18/2014  . Heart murmur, systolic   . Heart murmur 07/24/2013  . Hyperlipidemia 03/06/2013  . Hypertension 03/06/2013  . Tachycardia 01/13/2013  . Diabetes mellitus with foot ulcer and gangrene (HCC) 01/06/2013  . Diabetes mellitus type 2 with complications, uncontrolled (HCC) 12/08/2012  . Expressive aphasia syndrome 12/08/2012  . Late effects of cerebrovascular accident 12/08/2012    Orientation RESPIRATION BLADDER Height & Weight     Self, Time, Situation, Place  Normal Continent Weight: 200 lb (90.7 kg) Height:     BEHAVIORAL SYMPTOMS/MOOD  NEUROLOGICAL BOWEL NUTRITION STATUS      Continent Diet (carb modified)  AMBULATORY STATUS COMMUNICATION OF NEEDS Skin   Extensive Assist Verbally Surgical wounds, Wound Vac (Prevena wound vac on left foot amputation site)                       Personal Care Assistance Level of Assistance  Bathing, Dressing Bathing Assistance: Maximum assistance   Dressing Assistance: Maximum assistance     Functional Limitations Info             SPECIAL CARE FACTORS FREQUENCY  PT (By licensed PT), OT (By licensed OT)     PT Frequency: 5/wk OT Frequency: 5/wk            Contractures      Additional Factors Info  Code Status, Allergies, Insulin Sliding Scale Code Status Info: FULL Allergies Info: Unasyn Ampicillin-sulbactam Sodium   Insulin Sliding Scale Info: 3/day       Current Medications (10/22/2016):  This is the current hospital active medication list Current Facility-Administered Medications  Medication Dose Route Frequency Provider Last Rate Last Dose  . 0.9 %  sodium chloride infusion   Intravenous Continuous Aldean Baker V, MD      . acetaminophen (TYLENOL) tablet 650 mg  650 mg Oral Q6H PRN [name], MD       Or  . acetaminophen (TYLENOL) suppository 650 mg  650 mg Rectal Q6H PRN [name], MD      . aspirin EC tablet 325 mg  325 mg Oral Daily  [name], MD   325 mg at 10/22/16 0956  . atorvastatin (LIPITOR) tablet 40 mg  40 mg Oral Q breakfast [name], MD   40 mg at 10/21/16 1733  . bisacodyl (DULCOLAX) suppository 10 mg  10 mg Rectal Daily PRN [name], MD      . docusate sodium (COLACE) capsule 100 mg  100 mg Oral BID [name], MD   100 mg at 10/22/16 0956  . glipiZIDE (GLUCOTROL) tablet 5 mg  5 mg Oral BID AC [name], MD   5 mg at 10/22/16 0842  . HYDROmorphone (DILAUDID) injection 1 mg  1 mg Intravenous Q2H PRN [name], MD   1 mg at 10/21/16 0405  . insulin aspart (novoLOG) injection 0-15 Units  0-15 Units Subcutaneous TID  WC [name], MD   2 Units at 10/21/16 1733  . magnesium citrate solution 1 Bottle  1 Bottle Oral Once PRN [name], MD      . metFORMIN (GLUCOPHAGE-XR) 24 hr tablet 500 mg  500 mg Oral BID WC [name], MD   500 mg at 10/22/16 0842  . methocarbamol (ROBAXIN) tablet 500 mg  500 mg Oral Q6H PRN [name], MD   500 mg at 10/21/16 1953   Or  . methocarbamol (ROBAXIN) 500 mg in dextrose 5 % 50 mL IVPB  500 mg Intravenous Q6H PRN [name], MD      . metoCLOPramide (REGLAN) tablet 5-10 mg  5-10 mg Oral Q8H PRN [name], MD       Or  . metoCLOPramide (REGLAN) injection 5-10 mg  5-10 mg Intravenous Q8H PRN [name], MD      . metoprolol succinate (TOPROL-XL) 24 hr tablet 50 mg  50 mg Oral Daily [name], MD   50 mg at 10/22/16 0956  . ondansetron (ZOFRAN) tablet 4 mg  4 mg Oral Q6H PRN [name], MD       Or  . ondansetron [facility]) injection 4 mg  4 mg Intravenous Q6H PRN Aldean Baker V, MD      . oxyCODONE (Oxy IR/ROXICODONE) immediate release tablet 5-10 mg  5-10 mg Oral Q3H PRN [name], MD   10 mg at 10/21/16 1953  . polyethylene glycol (MIRALAX / GLYCOLAX) packet 17 g  17 g Oral Daily PRN [name], MD         Discharge Medications: Please see discharge summary for a list of discharge medications.  Relevant Imaging Results:  Relevant Lab Results:   Additional Information SS# [id number]  Burna Sis, LCSW

## 2016-10-22 NOTE — Clinical Social Work Note (Signed)
Clinical Social Work Assessment  Patient Details  Name: Stephen Fitzgerald MRN: [id number] Date of Birth: [birth year]  Date of referral:  10/20/16               Reason for consult:  Facility Placement                Permission sought to share information with:  Case Manager, Magazine features editor, Family Supports Permission granted to share information::  Yes, Verbal Permission Granted  Name::     Stephen Fitzgerald  Agency::     Relationship::  Father  Contact Information:  [phone]  Housing/Transportation Living arrangements for the past 2 months:  Single Family Home Source of Information:  Patient, Guardian Patient Interpreter Needed:  None Criminal Activity/Legal Involvement Pertinent to Current Situation/Hospitalization:  No - Comment as needed Significant Relationships:  Parents Lives with:  Self Do you feel safe going back to the place where you live?  Yes (However, feels SNF placement would be very beneficial at this moment. ) Need for family participation in patient care:  Yes (Comment)  Care giving concerns: Father reports concerns with the patient returning home alone. Father reports patient has been to [facility] and [facility] in the past and progressed really well after receiving services.    Social Worker assessment / plan:  CSW received consult by Charity fundraiser. CSW spoke with patient regarding the possible need for short term rehab. CSW introduced self and acknowledged the patient. Patient is alert and orientedx4. Patient was calm and cooperative with CSW assessment. Patient was accompanied via telephone by father. Patient provided CSW with permission to speak to Father Stephen Fitzgerald about discharge planning. CSW informed patient of PT recommendation for short term rehab. Patient and family is agreeable to SNF placement. CSW was provided permission to fax clinical information out to the facilities. Patient and family's preferences: (1)[facility] and (2)[facility]. CSW to complete FL2 for MD signature. CSW to initiate SNF placement process.    Employment status:  Disabled (Comment on whether or not currently receiving Disability) Insurance information:  Medicare PT Recommendations:  Skilled Nursing Facility Information / Referral to community resources:  Skilled Nursing Facility  Patient/Family's Response to care:  Father was appreciative of the services provided by CSW. No other concerns reported at this time.   Patient/Family's Understanding of and Emotional Response to Diagnosis, Current Treatment, and Prognosis:  Patient and family aware and understands current treatment, diagnosis, and prognosis.   Emotional Assessment Appearance:  Other (Comment Required (Assessed via telephone) Attitude/Demeanor/Rapport:  Other (Pleasant and Cooperative) Affect (typically observed):    Orientation:  Oriented to Self, Oriented to Place, Oriented to  Time, Oriented to Situation Alcohol / Substance use:  Never Used Psych involvement (Current and /or in the community):  No (Comment)  Discharge Needs  Concerns to be addressed:  Discharge Planning Concerns Readmission within the last 30 days:  No Current discharge risk:  Physical Impairment Barriers to Discharge:  Continued Medical Work up   [employer], LCSWA 10/22/2016, 2:11 PM

## 2016-10-22 NOTE — Progress Notes (Signed)
qPhysical Therapy Treatment Patient Details Name: Stephen Fitzgerald MRN: [id number] DOB: July 19, 1960 Today's Date: 10/22/2016    History of Present Illness   Patient S/P trans metatarsel amputation on 10/20/2016. He currently has a wound vac. PMH: includes stroke, HTN, DMII, heart murmur, OA ,     PT Comments    Pt presents with modest improvement in strength for basic mobility tasks and pain control.  Planned d/c to SNF tomorrow per chart; PT will check in with pt next date and continue acute care as needed.   Follow Up Recommendations  SNF     Equipment Recommendations  Rolling walker with 5" wheels    Recommendations for Other Services       Precautions / Restrictions Precautions Precautions: Fall Precaution Comments: bed/chair alarms, NWB LEFT LE Required Braces or Orthoses: Other Brace/Splint Other Brace/Splint: surgical shoe to left foot Restrictions Weight Bearing Restrictions: Yes LLE Weight Bearing: Non weight bearing Other Position/Activity Restrictions: elevate limb when not up; limit time up to functional needs    Mobility  Bed Mobility Overal bed mobility: Needs Assistance Bed Mobility: Supine to Sit     Supine to sit: Min assist     General bed mobility comments: cues to move, pt able to advance legs unassisted and very minimal assist to raise trunk and assume neutral at EOB  Transfers Overall transfer level: Needs assistance Equipment used: Rolling walker (2 wheeled) Transfers: Sit to/from Stand Sit to Stand: From elevated surface;Max assist         General transfer comment: unable initially to assume standing, but after donning tennis shoe to right foot pt able to rise with assist for initial lift off mostly and encouragemtn to stand tall and use arms to assist right leg (maintains NWB on left)  Ambulation/Gait Ambulation/Gait assistance: Min assist Ambulation Distance (Feet): 5 Feet Assistive device: Rolling walker (2 wheeled) Gait  Pattern/deviations: Step-to pattern;Trunk flexed (hops with RW due to NWB status LLE)     General Gait Details: declined further distance because lunch delivered;    Stairs            Wheelchair Mobility    Modified Rankin (Stroke Patients Only)       Balance Overall balance assessment: Needs assistance Sitting-balance support: No upper extremity supported;Feet supported Sitting balance-Leahy Scale: Good     Standing balance support: Bilateral upper extremity supported;During functional activity Standing balance-Leahy Scale: Zero Standing balance comment: dependent on RW for stability as NWB left LE                            Cognition Arousal/Alertness: Awake/alert Behavior During Therapy: WFL for tasks assessed/performed Overall Cognitive Status: Within Functional Limits for tasks assessed                                 General Comments: pt and father both cooperative and ready for d/c to snf      Exercises      General Comments General comments (skin integrity, edema, etc.): wound VAC in place and dry, no c/o pain or discomfort      Pertinent Vitals/Pain Pain Assessment: 0-10 Pain Score: 4  Pain Location: left foot Pain Descriptors / Indicators: Aching Pain Intervention(s): Limited activity within patient's tolerance;Monitored during session;Repositioned    Home Living  Prior Function            PT Goals (current goals can now be found in the care plan section) Acute Rehab PT Goals Patient Stated Goal: walk again without help PT Goal Formulation: With patient Time For Goal Achievement: 10/21/16 Potential to Achieve Goals: Good Progress towards PT goals: Progressing toward goals    Frequency    Min 3X/week      PT Plan Current plan remains appropriate    Co-evaluation             End of Session Equipment Utilized During Treatment: Gait belt Activity Tolerance: Patient  limited by fatigue;Patient tolerated treatment well Patient left: in chair;with call bell/phone within reach;with family/visitor present;with nursing/sitter in room;with chair alarm set Nurse Communication: Mobility status;Precautions PT Visit Diagnosis: Muscle weakness (generalized) (M62.81);Difficulty in walking, not elsewhere classified (R26.2);Pain Pain - Right/Left: Left Pain - part of body: Ankle and joints of foot     Time: 1235-1300 PT Time Calculation (min) (ACUTE ONLY): 25 min  Charges:  $Gait Training: 8-22 mins $Therapeutic Activity: 8-22 mins                    G Codes:          Dennis Bast 10/22/2016, 1:45 PM

## 2016-10-22 NOTE — Anesthesia Postprocedure Evaluation (Signed)
Anesthesia Post Note  Patient: Stephen Fitzgerald  Procedure(s) Performed: Procedure(s) (LRB): Left Transmetatarsal Amputation, Application of Wound VAC (Left)  Patient location during evaluation: PACU Anesthesia Type: General Level of consciousness: sedated and patient cooperative Pain management: pain level controlled Vital Signs Assessment: post-procedure vital signs reviewed and stable Respiratory status: spontaneous breathing Cardiovascular status: stable Anesthetic complications: no       Last Vitals:  Vitals:   10/22/16 0603 10/22/16 1156  BP: 124/74 129/75  Pulse: (!) 105 (!) 107  Resp: 16 18  Temp: 37.2 C 37.1 C    Last Pain:  Vitals:   10/22/16 1242  TempSrc:   PainSc: 0-No pain                 Lewie Loron

## 2016-10-23 ENCOUNTER — Encounter (HOSPITAL_COMMUNITY): Payer: Self-pay | Admitting: Orthopedic Surgery

## 2016-10-23 DIAGNOSIS — E1151 Type 2 diabetes mellitus with diabetic peripheral angiopathy without gangrene: Secondary | ICD-10-CM | POA: Diagnosis not present

## 2016-10-23 DIAGNOSIS — Z89432 Acquired absence of left foot: Secondary | ICD-10-CM | POA: Diagnosis not present

## 2016-10-23 DIAGNOSIS — M6281 Muscle weakness (generalized): Secondary | ICD-10-CM | POA: Diagnosis not present

## 2016-10-23 DIAGNOSIS — E1165 Type 2 diabetes mellitus with hyperglycemia: Secondary | ICD-10-CM | POA: Diagnosis not present

## 2016-10-23 DIAGNOSIS — E119 Type 2 diabetes mellitus without complications: Secondary | ICD-10-CM | POA: Diagnosis not present

## 2016-10-23 DIAGNOSIS — T8131XD Disruption of external operation (surgical) wound, not elsewhere classified, subsequent encounter: Secondary | ICD-10-CM | POA: Diagnosis not present

## 2016-10-23 DIAGNOSIS — R4701 Aphasia: Secondary | ICD-10-CM | POA: Diagnosis not present

## 2016-10-23 DIAGNOSIS — R278 Other lack of coordination: Secondary | ICD-10-CM | POA: Diagnosis not present

## 2016-10-23 DIAGNOSIS — Z4789 Encounter for other orthopedic aftercare: Secondary | ICD-10-CM | POA: Diagnosis not present

## 2016-10-23 DIAGNOSIS — Z4781 Encounter for orthopedic aftercare following surgical amputation: Secondary | ICD-10-CM | POA: Diagnosis not present

## 2016-10-23 DIAGNOSIS — S98919D Complete traumatic amputation of unspecified foot, level unspecified, subsequent encounter: Secondary | ICD-10-CM | POA: Diagnosis not present

## 2016-10-23 DIAGNOSIS — R531 Weakness: Secondary | ICD-10-CM | POA: Diagnosis not present

## 2016-10-23 DIAGNOSIS — I96 Gangrene, not elsewhere classified: Secondary | ICD-10-CM | POA: Diagnosis not present

## 2016-10-23 DIAGNOSIS — E1140 Type 2 diabetes mellitus with diabetic neuropathy, unspecified: Secondary | ICD-10-CM | POA: Diagnosis not present

## 2016-10-23 DIAGNOSIS — R262 Difficulty in walking, not elsewhere classified: Secondary | ICD-10-CM | POA: Diagnosis not present

## 2016-10-23 DIAGNOSIS — I10 Essential (primary) hypertension: Secondary | ICD-10-CM | POA: Diagnosis not present

## 2016-10-23 DIAGNOSIS — I639 Cerebral infarction, unspecified: Secondary | ICD-10-CM | POA: Diagnosis not present

## 2016-10-23 DIAGNOSIS — M869 Osteomyelitis, unspecified: Secondary | ICD-10-CM | POA: Diagnosis not present

## 2016-10-23 DIAGNOSIS — I739 Peripheral vascular disease, unspecified: Secondary | ICD-10-CM | POA: Diagnosis not present

## 2016-10-23 DIAGNOSIS — Z89439 Acquired absence of unspecified foot: Secondary | ICD-10-CM | POA: Diagnosis not present

## 2016-10-23 DIAGNOSIS — G629 Polyneuropathy, unspecified: Secondary | ICD-10-CM | POA: Diagnosis not present

## 2016-10-23 LAB — GLUCOSE, CAPILLARY
GLUCOSE-CAPILLARY: 108 mg/dL — AB (ref 65–99)
GLUCOSE-CAPILLARY: 261 mg/dL — AB (ref 65–99)
GLUCOSE-CAPILLARY: 159 mg/dL — AB (ref 65–99)

## 2016-10-23 MED ORDER — HYDROCODONE-ACETAMINOPHEN 5-325 MG PO TABS: 1.0000 | ORAL_TABLET | Freq: Four times a day (QID) | ORAL | 0 refills | Status: DC | PRN

## 2016-10-23 NOTE — Discharge Summary (Signed)
Discharge Diagnoses:  Active Problems:   Status post transmetatarsal amputation of left foot (HCC)   Surgeries: Procedure(s): Left Transmetatarsal Amputation, Application of Wound VAC on 10/20/2016    Consultants:   Discharged Condition: Improved  Hospital Course: Stephen Fitzgerald is an 56 y.o. male who was admitted 10/20/2016 with a chief complaint of abscess gangrene left forefoot, with a final diagnosis of Gangrene Left Foot.  Patient was brought to the operating room on 10/20/2016 and underwent Procedure(s): Left Transmetatarsal Amputation, Application of Wound VAC.    Patient was given perioperative antibiotics: Anti-infectives    Start     Dose/Rate Route Frequency Ordered Stop   10/20/16 2300  clindamycin (CLEOCIN) IVPB 600 mg     600 mg 100 mL/hr over 30 Minutes Intravenous Every 6 hours 10/20/16 2134 10/21/16 1143   10/20/16 1545  clindamycin (CLEOCIN) IVPB 900 mg     900 mg 100 mL/hr over 30 Minutes Intravenous On call to O.R. 10/20/16 1422 10/20/16 1850   10/20/16 1354  clindamycin (CLEOCIN) 900 MG/50ML IVPB    Comments:  Mechele Collin, Beth   : cabinet override      10/20/16 1354 10/20/16 1850    .  Patient was given sequential compression devices, early ambulation, and aspirin for DVT prophylaxis.  Recent vital signs: Patient Vitals for the past 24 hrs:  BP Temp Temp src Pulse Resp SpO2  10/23/16 0622 (!) 118/59 97.5 F (36.4 C) Oral 82 16 99 %  10/22/16 2156 133/80 99.1 F (37.3 C) Oral 98 18 98 %  10/22/16 1156 129/75 98.7 F (37.1 C) Oral (!) 107 18 96 %  .  Recent laboratory studies: No results found.  Discharge Medications:   Allergies as of 10/23/2016      Reactions   Unasyn [ampicillin-sulbactam Sodium] Rash   Has patient had a PCN reaction causing immediate rash, facial/tongue/throat swelling, SOB or lightheadedness with hypotension: No Has patient had a PCN reaction causing severe rash involving mucus membranes or skin necrosis: No Has patient had a PCN  reaction that required hospitalization: No Has patient had a PCN reaction occurring within the last 10 years: No If all of the above answers are "NO", then may proceed with Cephalosporin use.      Medication List    STOP taking these medications   doxycycline 100 MG tablet Commonly known as:  VIBRA-TABS     TAKE these medications   aspirin 81 MG tablet Take 1 tablet (81 mg total) by mouth daily.   aspirin-sod bicarb-citric acid 325 MG Tbef tablet Commonly known as:  ALKA-SELTZER Take 325 mg by mouth every 6 (six) hours as needed.   atorvastatin 40 MG tablet Commonly known as:  LIPITOR TAKE 1 TABLET BY MOUTH DAILY   B-12 2000 MCG Tabs Take 2,000 mcg by mouth daily.   CLEAR EYES MAX REDNESS RELIEF OP Apply 2 drops to eye daily as needed (dry eyes).   glipiZIDE 5 MG tablet Commonly known as:  GLUCOTROL TAKE 1 TABLET BY MOUTH TWICE DAILY BEFORE MEALS   HYDROcodone-acetaminophen 5-325 MG tablet Commonly known as:  NORCO Take 1-2 tablets by mouth every 6 (six) hours as needed for moderate pain. What changed:  Another medication with the same name was added. Make sure you understand how and when to take each.   HYDROcodone-acetaminophen 5-325 MG tablet Commonly known as:  NORCO Take 1 tablet by mouth every 6 (six) hours as needed. What changed:  You were already taking a medication with the  same name, and this prescription was added. Make sure you understand how and when to take each.   metFORMIN 500 MG 24 hr tablet Commonly known as:  GLUCOPHAGE-XR TAKE 4 TABLETS BY MOUTH EVERY DAY WITH BREAKFAST What changed:  See the new instructions.   metoprolol succinate 50 MG 24 hr tablet Commonly known as:  TOPROL-XL Take 1 tablet (50 mg total) by mouth daily. Take with or immediately following a meal.   naproxen sodium 220 MG tablet Commonly known as:  ANAPROX Take 220 mg by mouth 2 (two) times daily as needed (pain).       Diagnostic Studies: No results found.  Patient  benefited maximally from their hospital stay and there were no complications.     Disposition: 01-Home or Self Care Discharge Instructions    Call MD / Call 911    Complete by:  As directed    If you experience chest pain or shortness of breath, CALL 911 and be transported to the hospital emergency room.  If you develope a fever above 101 F, pus (white drainage) or increased drainage or redness at the wound, or calf pain, call your surgeon's office.   Constipation Prevention    Complete by:  As directed    Drink plenty of fluids.  Prune juice may be helpful.  You may use a stool softener, such as Colace (over the counter) 100 mg twice a day.  Use MiraLax (over the counter) for constipation as needed.   Diet - low sodium heart healthy    Complete by:  As directed    Increase activity slowly as tolerated    Complete by:  As directed    Negative Pressure Wound Therapy - Incisional    Complete by:  As directed    Attached wound VAC to the portable Prevena wound VAC pump. Discontinue VAC dressing in 5 days or if the wound VAC alarms discontinue dressing at that time start dry dressing changes at that time.   Non weight bearing    Complete by:  As directed    Laterality:  left   Extremity:  Lower     Follow-up Information    [name], MD Follow up in 1 week(s).   Specialty:  Orthopedic Surgery Contact information: [address] [place] [address] [phone]            Signed: [name] 10/23/2016, 6:32 AM

## 2016-10-23 NOTE — Clinical Social Work Note (Signed)
CSW spoke with pt's father. [facility] is first choice. Pt's father wants pt in private room, willing to pay any extra cost. CSW spoke with admissions coordinator at [facility]. Pt will have a private room today. CSW will set up transport.

## 2016-10-23 NOTE — Progress Notes (Signed)
Pt ready for d/c to SNF today per MD. Report called to [facility] at [facility], all questions answered. Pt's hospital wound vac switched to prevena vac prior to d/c. Belongings sent with pt. Pt's dad aware of plan.

## 2016-10-23 NOTE — Clinical Social Work Placement (Signed)
   CLINICAL SOCIAL WORK PLACEMENT  NOTE  Date:  10/23/2016  Patient Details  Name: Stephen Fitzgerald MRN: [id number] Date of Birth: April 19, 1961  Clinical Social Work is seeking post-discharge placement for this patient at the Skilled  Nursing Facility level of care (*CSW will initial, date and re-position this form in  chart as items are completed):  Yes   Patient/family provided with [facility] Clinical Social Work Department's list of facilities offering this level of care within the geographic area requested by the patient (or if unable, by the patient's family).  Yes   Patient/family informed of their freedom to choose among providers that offer the needed level of care, that participate in Medicare, Medicaid or managed care program needed by the patient, have an available bed and are willing to accept the patient.  Yes   Patient/family informed of [facility]'s ownership interest in [facility] and [facility], as well as of the fact that they are under no obligation to receive care at these facilities.  PASRR submitted to EDS on       PASRR number received on       Existing PASRR number confirmed on 10/22/16     FL2 transmitted to all facilities in geographic area requested by pt/family on 10/22/16     FL2 transmitted to all facilities within larger geographic area on       Patient informed that his/her managed care company has contracts with or will negotiate with certain facilities, including the following:        Yes   Patient/family informed of bed offers received.  Patient [facility] bed [facility]     Physician recommends and patient chooses bed at      Patient to be transferred to [facility] on 10/23/16.  Patient to be transferred to facility by [facility]     Patient family notified on 10/23/16 of transfer.  Name of family member notified:  [facility]     PHYSICIAN       Additional Comment:     _______________________________________________ Tresa Moore, LCSW 10/23/2016, 1:28 PM

## 2016-10-23 NOTE — Progress Notes (Signed)
qPhysical Therapy Treatment Patient Details Name: Stephen Fitzgerald MRN: [id number] DOB: 1960-09-01 Today's Date: 10/23/2016    History of Present Illness Patient S/P trans metatarsel amputation on 10/20/2016. He currently has a wound vac. PMH: includes stroke, HTN, DMII, heart murmur, OA ,     PT Comments    Making progress today, especially with bed mobility (slow moving, but not needing physical assist) and  Transfers (mod assist to power up compared to max assist yesterday); Pt agrees to plan for SNF for rehab to maximize independence and safety with mobility; He is nervous about staying up in the chair too long; we devised a plan to get up, and for nursing staff to assist him back to bed after lunch ( I tried to be clear that PT would not be getting him back to bed, to help with his expectations; discussed with Darl Pikes, NT, the plan fo rback to bed)  Follow Up Recommendations  SNF     Equipment Recommendations  Rolling walker with 5" wheels    Recommendations for Other Services       Precautions / Restrictions Precautions Precautions: Fall Precaution Comments: bed/chair alarms, NWB LEFT LE Required Braces or Orthoses: Other Brace/Splint Other Brace/Splint: surgical shoe to left foot Restrictions LLE Weight Bearing: Non weight bearing Other Position/Activity Restrictions: elevate limb when not up; limit time up to functional needs    Mobility  Bed Mobility Overal bed mobility: Needs Assistance Bed Mobility: Supine to Sit     Supine to sit: Min guard     General bed mobility comments: Cues for technique; slow moving; no need for physical assist  Transfers Overall transfer level: Needs assistance Equipment used: Rolling walker (2 wheeled) Transfers: Sit to/from Stand Sit to Stand: Mod assist         General transfer comment: Mod assist to power up; Cues for hand placement and safety  Ambulation/Gait Ambulation/Gait assistance: Min assist Ambulation Distance  (Feet): 7 Feet Assistive device: Rolling walker (2 wheeled) Gait Pattern/deviations: Step-to pattern;Trunk flexed (hops with RW due to NWB status LLE)     General Gait Details: Limited gait distance due to UE fatigue; short, staccato hops   Stairs            Wheelchair Mobility    Modified Rankin (Stroke Patients Only)       Balance     Sitting balance-Leahy Scale: Good       Standing balance-Leahy Scale: Poor                              Cognition Arousal/Alertness: Awake/alert Behavior During Therapy: WFL for tasks assessed/performed Overall Cognitive Status: Within Functional Limits for tasks assessed                                        Exercises      General Comments        Pertinent Vitals/Pain Pain Assessment: Faces Faces Pain Scale: Hurts little more Pain Location: left foot Pain Descriptors / Indicators: Burning Pain Intervention(s): Monitored during session    Home Living                      Prior Function            PT Goals (current goals can now be found in the care plan section) Acute  Rehab PT Goals Patient Stated Goal: walk again without help PT Goal Formulation: With patient Time For Goal Achievement: 10/21/16 Potential to Achieve Goals: Good Progress towards PT goals: Progressing toward goals    Frequency    Min 3X/week      PT Plan Current plan remains appropriate    Co-evaluation             End of Session Equipment Utilized During Treatment: Gait belt Activity Tolerance: Patient limited by fatigue Patient left: in chair;with call bell/phone within reach;with chair alarm set Nurse Communication: Mobility status;Precautions PT Visit Diagnosis: Muscle weakness (generalized) (M62.81);Difficulty in walking, not elsewhere classified (R26.2);Pain Pain - Right/Left: Left Pain - part of body: Ankle and joints of foot     Time: 4098-1191 PT Time Calculation (min) (ACUTE  ONLY): 20 min  Charges:  $Gait Training: 8-22 mins                    G Codes:       Van Clines, PT  Acute Rehabilitation Services Pager [phone] Office [phone]    Levi Aland 10/23/2016, 12:17 PM

## 2016-10-23 NOTE — Clinical Social Work Note (Signed)
Clinical Social Worker facilitated patient discharge including contacting patient family and facility to confirm patient discharge plans.  Clinical information faxed to facility and family agreeable with plan.  CSW arranged ambulance transport via [facility] to [facility].  RN to call [phone] report prior to discharge.  Patient is going to Rm 3251.  Clinical Social Worker will sign off for now as social work intervention is no longer needed. Please consult [place] again if new need arises.  Keene Breath, LCSW [phone]

## 2016-10-24 ENCOUNTER — Telehealth: Payer: Self-pay | Admitting: Student

## 2016-10-24 DIAGNOSIS — E1152 Type 2 diabetes mellitus with diabetic peripheral angiopathy with gangrene: Secondary | ICD-10-CM

## 2016-10-24 DIAGNOSIS — E1140 Type 2 diabetes mellitus with diabetic neuropathy, unspecified: Secondary | ICD-10-CM | POA: Diagnosis not present

## 2016-10-24 DIAGNOSIS — I639 Cerebral infarction, unspecified: Secondary | ICD-10-CM | POA: Diagnosis not present

## 2016-10-24 DIAGNOSIS — I96 Gangrene, not elsewhere classified: Secondary | ICD-10-CM | POA: Diagnosis not present

## 2016-10-24 DIAGNOSIS — Z794 Long term (current) use of insulin: Principal | ICD-10-CM

## 2016-10-24 DIAGNOSIS — I10 Essential (primary) hypertension: Secondary | ICD-10-CM | POA: Diagnosis not present

## 2016-10-24 NOTE — Telephone Encounter (Signed)
Pt was discharged from [facility] yesterday to a skilled nursing facility [facility]) for short term care. Pt had left foot amputated. Pt can be referred to [facility] if PCP deems appropriate for dm services. ep

## 2016-10-24 NOTE — Telephone Encounter (Signed)
Will forward to MD. Jazmin Hartsell,CMA  

## 2016-10-24 NOTE — Consult Note (Signed)
           [facility] CM Primary Care Navigator  10/24/2016  Stephen Fitzgerald [birth year] [id number]   Went to see patient at the bedside to identify possible discharge needs but patient was already discharged yesterday evening per staff.  Chart review reveals the patient was discharged to [facility] skilled nursing facility for short term rehabilitation.   Primary care provider's office called (Emily)to notify of patient's discharge and need for post discharge follow-up and transition of care after coming from SNF (skilled nursing facility.  Made aware to refer patient to [facility] care management if deemed necessary for services.  Notified Verdie Drown (post acute RN) for follow-up at [facility].  For questions, please contact:  Wyatt Haste, BSN, RN- [facility] Primary Care Navigator  Telephone: [phone] [facility]

## 2016-10-24 NOTE — Telephone Encounter (Signed)
[facility] referral placed.  

## 2016-10-27 ENCOUNTER — Ambulatory Visit (INDEPENDENT_AMBULATORY_CARE_PROVIDER_SITE_OTHER): Payer: Medicare Other | Admitting: Family

## 2016-10-27 ENCOUNTER — Other Ambulatory Visit: Payer: Self-pay

## 2016-10-27 DIAGNOSIS — Z89432 Acquired absence of left foot: Secondary | ICD-10-CM | POA: Diagnosis not present

## 2016-10-27 DIAGNOSIS — E1151 Type 2 diabetes mellitus with diabetic peripheral angiopathy without gangrene: Secondary | ICD-10-CM | POA: Diagnosis not present

## 2016-10-27 DIAGNOSIS — M869 Osteomyelitis, unspecified: Secondary | ICD-10-CM | POA: Diagnosis not present

## 2016-10-27 DIAGNOSIS — I10 Essential (primary) hypertension: Secondary | ICD-10-CM | POA: Diagnosis not present

## 2016-10-27 NOTE — Patient Outreach (Signed)
[facility] Acute Care) Care Management  10/27/16  Stephen Fitzgerald 1961-05-11 [id number]  RNCM received referral from Dr. Kennon Rounds at [facility] on 10/25/2016 related to diabetes and recent amputation of left foot. Per her note, patient was recently hospitalized and was discharged to [facility] SNF for short term care.   Patient was in hospital 4/6 - 10/23/2016.  Per notes, patient was discharged to SNF.  RNCM attempted to reach patient without success on cell at [phone]. Message stated that the number had been disconnected. RNCM attempted to reach patient without success at home number [phone] without success. Left HIPAA compliant voicemail with RNCM contact information and invitation to callback.  RNCM will route note to Verdie Drown [facility] post acute RN) to verify if patient is still admitted at [facility]. If patient is at home, [facility] will continue to attempt to reach patient. If patient is in SNF, RNCM will not open case.   [place] R. Takuya Lariccia, RN, BSN, CCM [facility] Care Management Coordinator [phone]

## 2016-10-27 NOTE — Progress Notes (Signed)
Office Visit Note   Patient: Stephen Fitzgerald           Date of Birth: 29-Jul-1960           MRN: [id number] Visit Date: 10/27/2016              Requested by: Bonney Aid, MD [address] [place], [place] [address] PCP: Velora Heckler, MD  No chief complaint on file.     HPI: The patient is a 56 year old gentleman who presents today one week status post left transmetatarsal amputation. The prevena wound VAC is in place. However there was a buildup of drainage over the dorsum of his foot and there is maceration underlying this. The patient and his father feel he is doing well. He has been working with physical therapy at skilled nursing.  Assessment & Plan: Visit Diagnoses: No diagnosis found.  Plan: Skilled nursing facility will begin daily Dial soap cleansing followed by dry dressing changes. Will remain nonweightbearing on the left foot. Follow-up in office in 2 more weeks. Hope to harvest sutures at that time. Discussed the importance of elevation as he does have significant swelling.  Follow-Up Instructions: Return in about 2 weeks (around 11/10/2016).   Ortho Exam  Patient is alert, oriented, no adenopathy, well-dressed, normal affect, normal respiratory effort. The left transmetatarsal amputation is approximated with sutures there is moderate to massive swelling of the left foot. There is scant drainage from the incision there is an area of macerated tissue over the dorsum of the foot there is no purulence no cellulitis.  Imaging: No results found.  Labs: Lab Results  Component Value Date   HGBA1C 7.0 (H) 08/23/2016   HGBA1C 8.9 (H) 12/08/2015   HGBA1C 8.7 11/25/2015   ESRSEDRATE 118 (H) 08/11/2014   ESRSEDRATE 70 (H) 12/03/2012   CRP 24.4 (H) 08/11/2014   REPTSTATUS 08/18/2014 FINAL 08/11/2014   GRAMSTAIN  12/04/2012    NO WBC SEEN NO SQUAMOUS EPITHELIAL CELLS SEEN RARE GRAM POSITIVE COCCI IN PAIRS   CULT  08/11/2014    STAPHYLOCOCCUS SPECIES (COAGULASE  NEGATIVE) Note: This organism is presumed to be Clindamycin resistant based on detection of inducible Clindamycin resistance. Note: Gram Stain Report Called to,Read Back By and Verified With: Gae Gallop RN [phone] Performed at [facility]    LABORGA STAPHYLOCOCCUS SPECIES (COAGULASE NEGATIVE) 08/11/2014    Orders:  No orders of the defined types were placed in this encounter.  No orders of the defined types were placed in this encounter.    Procedures: No procedures performed  Clinical Data: No additional findings.  ROS:  All other systems negative, except as noted in the HPI. Review of Systems  Constitutional: Negative for chills and fever.    Objective: Vital Signs: There were no vitals taken for this visit.  Specialty Comments:  No specialty comments available.  PMFS History: Patient Active Problem List   Diagnosis Date Noted  . S/P transmetatarsal amputation of foot, left (HCC) 10/20/2016  . Healthcare maintenance 04/27/2015  . Foot swelling 12/16/2014  . Amputation of left hand ring finger 09/23/2014  . History of osteomyelitis 08/26/2014  . Serum albumin decreased 08/26/2014  . Vitamin B12 deficiency 08/18/2014  . Heart murmur, systolic   . Heart murmur 07/24/2013  . Hyperlipidemia 03/06/2013  . Hypertension 03/06/2013  . Tachycardia 01/13/2013  . Diabetes mellitus type 2 with complications, uncontrolled (HCC) 12/08/2012  . Expressive aphasia syndrome 12/08/2012  . Late effects of cerebrovascular accident 12/08/2012   Past Medical  History:  Diagnosis Date  . Abscess of hand, left 12/08/2012   left hand with only 3rd to 5th finger, 4th finger to midjoint.- reddened but dry and intact scar line.  . Arthritis    ankles & shoulders   . Bacteremia   . Blood poisoning (HCC)   . Diabetes mellitus without complication (HCC)    not usually checking blood sugars daily.  . Flexor tenosynovitis of finger 12/08/2012  . Heart murmur   . Hypertension   .  Stroke [facility])    expressive aphasia, R sided weakness   . Toe osteomyelitis, right (HCC)    right second toe    No family history on file.  Past Surgical History:  Procedure Laterality Date  . ABDOMINAL AORTAGRAM N/A 08/13/2014   Procedure: ABDOMINAL [name];  Surgeon: Fransisco Hertz, MD;  Location: [facility] CATH LAB;  Service: Cardiovascular;  Laterality: N/A;  . AMPUTATION Left 12/06/2012   Procedure: AMPUTATION DIGIT LEFT INDEX;  Surgeon: [name], MD;  [facility] OR;  Service: Orthopedics;  Laterality: Left;  . AMPUTATION Left 08/15/2014   Procedure: AMPUTATION LEFT RING FINGER;  Surgeon: [name], MD;  [facility] OR;  Service: Orthopedics;  Laterality: Left;  . AMPUTATION Right 08/15/2014   Procedure: AMPUTATION RIGHT FOOT FIRST RAY AMPUTATION AND IRRIGATION AND DEBREDMENT OF RIGHT FOOT ABSCESS;  Surgeon: [name], MD;  [facility] OR;  Service: Orthopedics;  Laterality: Right;  . AMPUTATION Right 12/10/2015   Procedure: AMPUTATION RIGHT 2ND RAY;  Surgeon: [name], MD;  [facility]: [facility] ORS;  Service: Orthopedics;  Laterality: Right;  . AMPUTATION Left 08/24/2016   Procedure: LEFT GREAT TOE AMPUTATION;  Surgeon: [name], MD;  [facility]: [facility] ORS;  Service: Orthopedics;  Laterality: Left;  . AMPUTATION Left 10/20/2016   Procedure: Left Transmetatarsal Amputation, Application of Wound VAC;  Surgeon: [name], MD;  [facility]: [facility] OR;  Service: Orthopedics;  Laterality: Left;  . APPLICATION OF WOUND VAC Left 10/20/2016   foot  . CATARACT EXTRACTION W/ INTRAOCULAR LENS  IMPLANT, BILATERAL Bilateral   . I&D EXTREMITY Left 12/04/2012   Procedure: IRRIGATION AND DEBRIDEMENT Left Hand with Ring Removal  times three, Carpal Tunnel , Flexor tendon Synovectomy;  Surgeon: [name], MD;  [facility] OR;  Service: Orthopedics;  Laterality: Left;  . I&D EXTREMITY Left 12/06/2012   Procedure: IRRIGATION AND DEBRIDEMENT EXTREMITY LEFT HAND;   Surgeon: [name], MD;  Location[facility] OR;  Service: Orthopedics;  Laterality: Left;  . I&D EXTREMITY Left 10/03/2016   Procedure: IRRIGATION AND DEBRIDEMENT LEFT 1ST METATARSAL with Wound Closure;  Surgeon: [name], MD;  [facility] OR;  Service: Orthopedics;  Laterality: Left;  . INGUINAL HERNIA REPAIR Bilateral 1964  . TENDON REPAIR Left 12/23/2012   Procedure: DIVISION AND INSETTING FLAP TO LEFT RING FINGER ADJACENT TISSUE REARRANGEMENT LEFT INDEX FINGER AMPUTATION SITE;  Surgeon: [name], MD;  Location: [facility];  Service: Orthopedics;  Laterality: Left;  . TONSILLECTOMY    . TRANSMETATARSAL AMPUTATION Left 10/20/2016    Transmetatarsal Amputation, Application of Wound VAC   Social History   Occupational History  . Not on file.   Social History Main Topics  . Smoking status: Never Smoker  . Smokeless tobacco: Never Used  . Alcohol use 33.6 oz/week    56 Cans of beer per week     Comment: 8-12 beers daily  . Drug use: No  . Sexual activity: No

## 2016-10-30 ENCOUNTER — Other Ambulatory Visit: Payer: Self-pay

## 2016-10-30 NOTE — Patient Outreach (Signed)
[facility] Customer service manager [facility]) Care Management  10/30/16  Stephen Fitzgerald 03/23/1961 [id number]  RNCM verified with Verdie Drown [facility]) that patient is currently at [facility] SNF. Will close community episode and Corrie Dandy will follow patient at [facility]  [place] R. Giulietta Prokop, RN, BSN, CCM [facility] Care Management Coordinator [phone]

## 2016-10-31 ENCOUNTER — Other Ambulatory Visit: Payer: Self-pay | Admitting: *Deleted

## 2016-10-31 NOTE — Patient Outreach (Signed)
[facility]) Care Management  10/31/2016  NESBIT MICHON May 02, 1961 [id number]   Referral from [place], Tish Men, to follow while in SNF, she states she had gotten referral from MD office and noted patient was in Skilled and she was unable to follow while in Skilled care.  Met with patient at facility.  Reviewed referral from MD office and reason for [facility] program services.   Patient very talkative, but due to stroke deficits at times hard to follow conversation from patient and unable to ascertain if patient clearly understood RNCM.  Patient states he will be at facility at least until his follow up post op appointment with surgeon next week, then he is not sure how long he will be there.   Patient states he is divorced and since his stroke in 1999 his dad has been assisting him with his medications. Patient unable to name his medications states he "just takes the pills".  He does endorse that his dad fills med boxes with twice a day dosing. He denies any insulin or injections.  Patient gave verbal permission to call his father regarding medications if needed. Attempted call to patient Dad, Obryan Radu [phone] answer and no way to leave a message.   Patient did not want to sign up for [facility] care management and became irritated around the idea that Dr. Lincoln Brigham had referred him to our program.  RNCM left a brochure with [facility] and RNCM contact for future reference.   Plan to attempt to offer [facility] services again before discharge from SNF.  If refuses again will close and let MD office know of refusal.   Royetta Crochet. Laymond Purser, RN, BSN, [place] [phone]) Business Cell  [phone]) Toll Free Office

## 2016-11-03 DIAGNOSIS — I10 Essential (primary) hypertension: Secondary | ICD-10-CM | POA: Diagnosis not present

## 2016-11-03 DIAGNOSIS — I639 Cerebral infarction, unspecified: Secondary | ICD-10-CM | POA: Diagnosis not present

## 2016-11-03 DIAGNOSIS — E1140 Type 2 diabetes mellitus with diabetic neuropathy, unspecified: Secondary | ICD-10-CM | POA: Diagnosis not present

## 2016-11-03 DIAGNOSIS — I96 Gangrene, not elsewhere classified: Secondary | ICD-10-CM | POA: Diagnosis not present

## 2016-11-09 DIAGNOSIS — I96 Gangrene, not elsewhere classified: Secondary | ICD-10-CM | POA: Diagnosis not present

## 2016-11-09 DIAGNOSIS — E1140 Type 2 diabetes mellitus with diabetic neuropathy, unspecified: Secondary | ICD-10-CM | POA: Diagnosis not present

## 2016-11-09 DIAGNOSIS — I639 Cerebral infarction, unspecified: Secondary | ICD-10-CM | POA: Diagnosis not present

## 2016-11-09 DIAGNOSIS — I10 Essential (primary) hypertension: Secondary | ICD-10-CM | POA: Diagnosis not present

## 2016-11-13 ENCOUNTER — Ambulatory Visit (INDEPENDENT_AMBULATORY_CARE_PROVIDER_SITE_OTHER): Payer: Medicare Other | Admitting: Orthopedic Surgery

## 2016-11-13 ENCOUNTER — Encounter (INDEPENDENT_AMBULATORY_CARE_PROVIDER_SITE_OTHER): Payer: Self-pay | Admitting: Orthopedic Surgery

## 2016-11-13 DIAGNOSIS — Z89432 Acquired absence of left foot: Secondary | ICD-10-CM

## 2016-11-13 MED ORDER — SILVER SULFADIAZINE 1 % EX CREA: 1.0000 "application " | TOPICAL_CREAM | Freq: Every day | CUTANEOUS | 0 refills | Status: AC

## 2016-11-13 NOTE — Progress Notes (Signed)
Post-Op Visit Note   Patient: Stephen Fitzgerald           Date of Birth: [birth year]           MRN: [id number] Visit Date: 11/13/2016 PCP: Velora Heckler, MD  Chief Complaint:  Chief Complaint  Patient presents with  . Left Foot - Routine Post Op    HPI:  The patient is a 56 year old gentleman who presents today about a month out from left transmetatarsal amputation. Sutures remain in place. Has been touchdown weightbearing with physical therapy. This developed a new dorsal ulcer over the forefoot this is covered with eschar. Father wondering when he'll be able to be full weightbearing and return home.    Ortho Exam The amputation has nearly healed. There is one remaining area that is just 5 mm across in 4 mm wide there is beefy granulation tissue in the wound bed this does not probe. There is slight serous sanguinous drainage. Over the dorsum of the foot there is a half-dollar sized area of eschar. This was debrided. There is no underlying depth. Does have some serous drainage as well there is moderate swelling to the foot. No erythema no a simple ascending cellulitis.  Visit Diagnoses:  1. S/P transmetatarsal amputation of foot, left (HCC)     Plan: Sutures harvested today without incident. Silvadene dressing applied. Orders for Silvadene dressing changes daily. He may be 50% weightbearing on the left foot.  Follow-Up Instructions: Return in about 2 weeks (around 11/27/2016).   Imaging: No results found.  Orders:  No orders of the defined types were placed in this encounter.  Meds ordered this encounter  Medications  . silver sulfADIAZINE (SILVADENE) 1 % cream    Sig: Apply 1 application topically daily.    Dispense:  50 g    Refill:  0     PMFS History: Patient Active Problem List   Diagnosis Date Noted  . S/P transmetatarsal amputation of foot, left (HCC) 10/20/2016  . Healthcare maintenance 04/27/2015  . Foot swelling 12/16/2014  . Amputation of left hand ring  finger 09/23/2014  . History of osteomyelitis 08/26/2014  . Serum albumin decreased 08/26/2014  . Vitamin B12 deficiency 08/18/2014  . Heart murmur, systolic   . Heart murmur 07/24/2013  . Hyperlipidemia 03/06/2013  . Hypertension 03/06/2013  . Tachycardia 01/13/2013  . Diabetes mellitus type 2 with complications, uncontrolled (HCC) 12/08/2012  . Expressive aphasia syndrome 12/08/2012  . 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 Abscess of hand, left 12/08/2012   left hand with only 3rd to 5th finger, 4th finger to midjoint.- reddened but dry and intact scar line.  . Arthritis    ankles & shoulders   . Bacteremia   . Blood poisoning   . Diabetes mellitus without complication (HCC)    not usually checking blood sugars daily.  . Flexor tenosynovitis of finger 12/08/2012  . Heart murmur   . Hypertension   . Stroke [facility])    expressive aphasia, R sided weakness   . Toe osteomyelitis, right (HCC)    right second toe    No family history on file.  Past Surgical History:  Procedure Laterality Date  . ABDOMINAL AORTAGRAM N/A 08/13/2014   Procedure: ABDOMINAL [name];  Surgeon: Fransisco Hertz, MD;  Location: [facility] CATH LAB;  Service: Cardiovascular;  Laterality: N/A;  . AMPUTATION Left 12/06/2012   Procedure: AMPUTATION DIGIT LEFT INDEX;  Surgeon: [name], MD;  [facility];  Service: Orthopedics;  Laterality: Left;  . AMPUTATION Left 08/15/2014   Procedure: AMPUTATION LEFT RING FINGER;  Surgeon: [name], MD;  [facility] OR;  Service: Orthopedics;  Laterality: Left;  . AMPUTATION Right 08/15/2014   Procedure: AMPUTATION RIGHT FOOT FIRST RAY AMPUTATION AND IRRIGATION AND DEBREDMENT OF RIGHT FOOT ABSCESS;  Surgeon: [name], MD;  [facility] OR;  Service: Orthopedics;  Laterality: Right;  . AMPUTATION Right 12/10/2015   Procedure: AMPUTATION RIGHT 2ND RAY;  Surgeon: [name], MD;   [facility] ORS;  Service: Orthopedics;  Laterality: Right;  . AMPUTATION Left 08/24/2016   Procedure: LEFT GREAT TOE AMPUTATION;  Surgeon: [name], MD;  [facility] ORS;  Service: Orthopedics;  Laterality: Left;  . AMPUTATION Left 10/20/2016   Procedure: Left Transmetatarsal Amputation, Application of Wound VAC;  Surgeon: [name], MD;  [facility] OR;  Service: Orthopedics;  Laterality: Left;  . APPLICATION OF WOUND VAC Left 10/20/2016   foot  . CATARACT EXTRACTION W/ INTRAOCULAR LENS  IMPLANT, BILATERAL Bilateral   . I&D EXTREMITY Left 12/04/2012   Procedure: IRRIGATION AND DEBRIDEMENT Left Hand with Ring Removal  times three, Carpal Tunnel , Flexor tendon Synovectomy;  Surgeon: [name], MD;  [facility] OR;  Service: Orthopedics;  Laterality: Left;  . I&D EXTREMITY Left 12/06/2012   Procedure: IRRIGATION AND DEBRIDEMENT EXTREMITY LEFT HAND;  Surgeon: [name], MD;  Location: [facility] OR;  Service: Orthopedics;  Laterality: Left;  . I&D EXTREMITY Left 10/03/2016   Procedure: IRRIGATION AND DEBRIDEMENT LEFT 1ST METATARSAL with Wound Closure;  Surgeon: [name], MD;  [facility] OR;  Service: Orthopedics;  Laterality: Left;  . INGUINAL HERNIA REPAIR Bilateral 1964  . TENDON REPAIR Left 12/23/2012   Procedure: DIVISION AND INSETTING FLAP TO LEFT RING FINGER ADJACENT TISSUE REARRANGEMENT LEFT INDEX FINGER AMPUTATION SITE;  Surgeon: [name], MD;  Location: [facility];  Service: Orthopedics;  Laterality: Left;  . TONSILLECTOMY    . TRANSMETATARSAL AMPUTATION Left 10/20/2016    Transmetatarsal Amputation, Application of Wound VAC   Social History   Occupational History  . Not on file.   Social History Main Topics  . Smoking status: Never Smoker  . Smokeless tobacco: Never Used  . Alcohol use 33.6 oz/week    56 Cans of beer per week     Comment: 8-12 beers daily  . Drug use: No  . Sexual activity: No

## 2016-11-14 ENCOUNTER — Encounter: Payer: Self-pay | Admitting: *Deleted

## 2016-11-14 ENCOUNTER — Other Ambulatory Visit: Payer: Self-pay | Admitting: *Deleted

## 2016-11-14 NOTE — Patient Outreach (Signed)
[facility]) Care Management  11/14/2016  Stephen Fitzgerald 02-11-1961 [id number]  Met with patient and his dad at facility.  Patient still denies any care management needs. He expects to be released for WB status in 2 weeks, anticipates being at facility another 2 weeks after he gets WB status.  His hope is to get back to his prior level of function and independence.  Patient describes his life since his stroke 20 years ago as "routine" and this surgery has been a set back.  He states he usually goes out to eat for his meals. He reports he keeps his weight at 220-225#. He was driving and living alone in his apartment. He reports he does not have issues with his blood sugars and denies diabetes. He does however state that he is taking insuline while at facility.  But when he goes home he will be on a pill.  Dad reports he allows patient his independence. He does assist minimally with medication, as far as picking up from pharmacy and knowing what the meds are for, patient is filling med planner. Dad reports he has his own health issues and just finished cancer treatments and now has a toe infection similar to patients foot issues.  RNCM reviewed [facility] services, gave brochure. Discussed referral from the doctor and that the doctor is concerned about patient health.   Dad appreciates hearing about [facility] care management.  Patient refuses services at this time.  Both patient and Dad given [facility] and RNCM contact information for future reference.   Plan to sign off and will send MD note regarding patient refusal of [facility] care management services.  Royetta Crochet. Laymond Purser, RN, BSN, [place] [phone]) Business Cell  [phone]) Toll Free Office

## 2016-11-17 DIAGNOSIS — E1140 Type 2 diabetes mellitus with diabetic neuropathy, unspecified: Secondary | ICD-10-CM | POA: Diagnosis not present

## 2016-11-17 DIAGNOSIS — I96 Gangrene, not elsewhere classified: Secondary | ICD-10-CM | POA: Diagnosis not present

## 2016-11-17 DIAGNOSIS — I10 Essential (primary) hypertension: Secondary | ICD-10-CM | POA: Diagnosis not present

## 2016-11-17 DIAGNOSIS — I639 Cerebral infarction, unspecified: Secondary | ICD-10-CM | POA: Diagnosis not present

## 2016-11-23 ENCOUNTER — Telehealth (INDEPENDENT_AMBULATORY_CARE_PROVIDER_SITE_OTHER): Payer: Self-pay | Admitting: Orthopedic Surgery

## 2016-11-23 NOTE — Telephone Encounter (Signed)
Ok we will leave appointment alone.

## 2016-11-23 NOTE — Telephone Encounter (Signed)
Patient's father called advised patient is in a nursing home and had an appointment for Monday at 2:00 pm and transportation was arranged for patient to arrive at 1:45 pm and the time can not be changed.  The number to contact Genevie CheshireBilly is [phone]

## 2016-11-24 DIAGNOSIS — G629 Polyneuropathy, unspecified: Secondary | ICD-10-CM | POA: Diagnosis not present

## 2016-11-24 DIAGNOSIS — I739 Peripheral vascular disease, unspecified: Secondary | ICD-10-CM | POA: Diagnosis not present

## 2016-11-24 DIAGNOSIS — M869 Osteomyelitis, unspecified: Secondary | ICD-10-CM | POA: Diagnosis not present

## 2016-11-24 DIAGNOSIS — R531 Weakness: Secondary | ICD-10-CM | POA: Diagnosis not present

## 2016-11-24 DIAGNOSIS — Z89439 Acquired absence of unspecified foot: Secondary | ICD-10-CM | POA: Diagnosis not present

## 2016-11-24 DIAGNOSIS — E119 Type 2 diabetes mellitus without complications: Secondary | ICD-10-CM | POA: Diagnosis not present

## 2016-11-27 ENCOUNTER — Ambulatory Visit (INDEPENDENT_AMBULATORY_CARE_PROVIDER_SITE_OTHER): Payer: Medicare Other | Admitting: Family

## 2016-11-27 ENCOUNTER — Encounter (INDEPENDENT_AMBULATORY_CARE_PROVIDER_SITE_OTHER): Payer: Self-pay | Admitting: Family

## 2016-11-27 ENCOUNTER — Ambulatory Visit (INDEPENDENT_AMBULATORY_CARE_PROVIDER_SITE_OTHER): Payer: Medicare Other | Admitting: Orthopedic Surgery

## 2016-11-27 DIAGNOSIS — I96 Gangrene, not elsewhere classified: Secondary | ICD-10-CM | POA: Diagnosis not present

## 2016-11-27 DIAGNOSIS — Z89432 Acquired absence of left foot: Secondary | ICD-10-CM

## 2016-11-27 DIAGNOSIS — E1140 Type 2 diabetes mellitus with diabetic neuropathy, unspecified: Secondary | ICD-10-CM | POA: Diagnosis not present

## 2016-11-27 DIAGNOSIS — I639 Cerebral infarction, unspecified: Secondary | ICD-10-CM | POA: Diagnosis not present

## 2016-11-27 DIAGNOSIS — E118 Type 2 diabetes mellitus with unspecified complications: Secondary | ICD-10-CM

## 2016-11-27 DIAGNOSIS — L97421 Non-pressure chronic ulcer of left heel and midfoot limited to breakdown of skin: Secondary | ICD-10-CM

## 2016-11-27 DIAGNOSIS — I10 Essential (primary) hypertension: Secondary | ICD-10-CM | POA: Diagnosis not present

## 2016-11-27 DIAGNOSIS — E1165 Type 2 diabetes mellitus with hyperglycemia: Secondary | ICD-10-CM

## 2016-11-27 DIAGNOSIS — E11621 Type 2 diabetes mellitus with foot ulcer: Secondary | ICD-10-CM

## 2016-11-27 NOTE — Progress Notes (Signed)
Post-Op Visit Note   Patient: Stephen Fitzgerald           Date of Birth: [birth year]           MRN: [id number] Visit Date: 11/27/2016 PCP: Bonney Aid, MD  Chief Complaint:  Chief Complaint  Patient presents with  . Left Foot - Follow-up    10/20/16 Left Transmetatarsal Amputation 38 days post op    HPI:  The patient is a 56 year old gentleman who presents today for little over a month out from a transmetatarsal amputation on the left. This has healed greatly since last exam. His father accompanies the visit. He continues to reside at [facility] for rehabilitation. He's been touchdown weightbearing through the heel.    Ortho Exam the left transmetatarsal amputation is healing well. Laterally there is a 5 mm area that has not yet healed. This is filled in with exudative tissue. Foot continues to be moderately swollen. Dorsomedially there is a half dollar sized ulcer. This was debrided of eschar. There is exudative tissue filling in 100% of wound bed. This has no drainage. No odor. No cellulitis.   Visit Diagnoses:  1. S/P transmetatarsal amputation of foot, left (HCC)   2. Uncontrolled type 2 diabetes mellitus with complication, unspecified whether long term insulin use (HCC)   3. Diabetic ulcer of left midfoot associated with type 2 diabetes mellitus, limited to breakdown of skin (HCC)     Plan: continue daily wound cleansing. Apply dressing. Elevate as able. Will increase activities as tolerate. Weight bearing as tolerated.  Follow-Up Instructions: Return in about 2 weeks (around 12/11/2016).   Imaging: No results found.  Orders:  No orders of the defined types were placed in this encounter.  No orders of the defined types were placed in this encounter.    PMFS History: Patient Active Problem List   Diagnosis Date Noted  . Diabetic ulcer of left midfoot associated with type 2 diabetes mellitus, limited to breakdown of skin (HCC) 11/27/2016  . S/P transmetatarsal  amputation of foot, left (HCC) 10/20/2016  . Healthcare maintenance 04/27/2015  . Foot swelling 12/16/2014  . Amputation of left hand ring finger 09/23/2014  . History of osteomyelitis 08/26/2014  . Serum albumin decreased 08/26/2014  . Vitamin B12 deficiency 08/18/2014  . Heart murmur, systolic   . Heart murmur 07/24/2013  . Hyperlipidemia 03/06/2013  . Hypertension 03/06/2013  . Tachycardia 01/13/2013  . Diabetes mellitus type 2 with complications, uncontrolled (HCC) 12/08/2012  . Expressive aphasia syndrome 12/08/2012  . 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 Abscess of hand, left 12/08/2012   left hand with only 3rd to 5th finger, 4th finger to midjoint.- reddened but dry and intact scar line.  . Arthritis    ankles & shoulders   . Bacteremia   . Blood poisoning   . Diabetes mellitus without complication (HCC)    not usually checking blood sugars daily.  . Flexor tenosynovitis of finger 12/08/2012  . Heart murmur   . Hypertension   . Stroke [facility])    expressive aphasia, R sided weakness   . Toe osteomyelitis, right (HCC)    right second toe    No family history on file.  Past Surgical History:  Procedure Laterality Date  . ABDOMINAL AORTAGRAM N/A 08/13/2014   Procedure: ABDOMINAL [name];  Surgeon: Fransisco Hertz, MD;  Location: [facility] CATH LAB;  Service: Cardiovascular;  Laterality: N/A;  . AMPUTATION Left 12/06/2012  Procedure: AMPUTATION DIGIT LEFT INDEX;  Surgeon: Jodi Marbleavid A Thompson, MD;  [facility];  Service: Orthopedics;  Laterality: Left;  . AMPUTATION Left 08/15/2014   Procedure: AMPUTATION LEFT RING FINGER;  Surgeon: Kathryne Hitchhristopher Y Blackman, MD;  [facility] OR;  Service: Orthopedics;  Laterality: Left;  . AMPUTATION Right 08/15/2014   Procedure: AMPUTATION RIGHT FOOT FIRST RAY AMPUTATION AND IRRIGATION AND DEBREDMENT OF RIGHT FOOT ABSCESS;  Surgeon: Kathryne Hitchhristopher Y Blackman, MD;  [facility] OR;  Service:  Orthopedics;  Laterality: Right;  . AMPUTATION Right 12/10/2015   Procedure: AMPUTATION RIGHT 2ND RAY;  Surgeon: Kathryne Hitchhristopher Y Blackman, MD;  [facility] ORS;  Service: Orthopedics;  Laterality: Right;  . AMPUTATION Left 08/24/2016   Procedure: LEFT GREAT TOE AMPUTATION;  Surgeon: Kathryne Hitchhristopher Y Blackman, MD;  [facility]: [facility]S;  Service: Orthopedics;  Laterality: Left;  . AMPUTATION Left 10/20/2016   Procedure: Left Transmetatarsal Amputation, Application of Wound VAC;  Surgeon: Nadara MustardMarcus Duda V, MD;  Location: [facility] OR;  Service: Orthopedics;  Laterality: Left;  . APPLICATION OF WOUND VAC Left 10/20/2016   foot  . CATARACT EXTRACTION W/ INTRAOCULAR LENS  IMPLANT, BILATERAL Bilateral   . I&D EXTREMITY Left 12/04/2012   Procedure: IRRIGATION AND DEBRIDEMENT Left Hand with Ring Removal  times three, Carpal Tunnel , Flexor tendon Synovectomy;  Surgeon: Jodi Marbleavid A Thompson, MD;  [facility] OR;  Service: Orthopedics;  Laterality: Left;  . I&D EXTREMITY Left 12/06/2012   Procedure: IRRIGATION AND DEBRIDEMENT EXTREMITY LEFT HAND;  Surgeon: Jodi Marbleavid A Thompson, MD;  [facility];  Service: Orthopedics;  Laterality: Left;  . I&D EXTREMITY Left 10/03/2016   Procedure: IRRIGATION AND DEBRIDEMENT LEFT 1ST METATARSAL with Wound Closure;  Surgeon: Kathryne Hitchhristopher Y Blackman, MD;  [facility];  Service: Orthopedics;  Laterality: Left;  . INGUINAL HERNIA REPAIR Bilateral 1964  . TENDON REPAIR Left 12/23/2012   Procedure: DIVISION AND INSETTING FLAP TO LEFT RING FINGER ADJACENT TISSUE REARRANGEMENT LEFT INDEX FINGER AMPUTATION SITE;  Surgeon: Jodi Marbleavid A Thompson, MD;  Location: [facility];  Service: Orthopedics;  Laterality: Left;  . TONSILLECTOMY    . TRANSMETATARSAL AMPUTATION Left 10/20/2016    Transmetatarsal Amputation, Application of Wound VAC   Social History   Occupational History  . Not on file.   Social History Main Topics  . Smoking status: Never Smoker  . Smokeless tobacco: Never Used  . Alcohol  use 33.6 oz/week    56 Cans of beer per week     Comment: 8-12 beers daily  . Drug use: No  . Sexual activity: No

## 2016-12-01 DIAGNOSIS — E1140 Type 2 diabetes mellitus with diabetic neuropathy, unspecified: Secondary | ICD-10-CM | POA: Diagnosis not present

## 2016-12-01 DIAGNOSIS — I10 Essential (primary) hypertension: Secondary | ICD-10-CM | POA: Diagnosis not present

## 2016-12-01 DIAGNOSIS — I96 Gangrene, not elsewhere classified: Secondary | ICD-10-CM | POA: Diagnosis not present

## 2016-12-01 DIAGNOSIS — I639 Cerebral infarction, unspecified: Secondary | ICD-10-CM | POA: Diagnosis not present

## 2016-12-05 ENCOUNTER — Encounter (HOSPITAL_BASED_OUTPATIENT_CLINIC_OR_DEPARTMENT_OTHER): Payer: Medicare Other

## 2016-12-07 DIAGNOSIS — E1140 Type 2 diabetes mellitus with diabetic neuropathy, unspecified: Secondary | ICD-10-CM | POA: Diagnosis not present

## 2016-12-07 DIAGNOSIS — I10 Essential (primary) hypertension: Secondary | ICD-10-CM | POA: Diagnosis not present

## 2016-12-07 DIAGNOSIS — I639 Cerebral infarction, unspecified: Secondary | ICD-10-CM | POA: Diagnosis not present

## 2016-12-07 DIAGNOSIS — I96 Gangrene, not elsewhere classified: Secondary | ICD-10-CM | POA: Diagnosis not present

## 2016-12-12 DIAGNOSIS — E1151 Type 2 diabetes mellitus with diabetic peripheral angiopathy without gangrene: Secondary | ICD-10-CM | POA: Diagnosis not present

## 2016-12-12 DIAGNOSIS — Z4781 Encounter for orthopedic aftercare following surgical amputation: Secondary | ICD-10-CM | POA: Diagnosis not present

## 2016-12-13 ENCOUNTER — Telehealth (INDEPENDENT_AMBULATORY_CARE_PROVIDER_SITE_OTHER): Payer: Self-pay

## 2016-12-13 DIAGNOSIS — E1151 Type 2 diabetes mellitus with diabetic peripheral angiopathy without gangrene: Secondary | ICD-10-CM | POA: Diagnosis not present

## 2016-12-13 DIAGNOSIS — Z4781 Encounter for orthopedic aftercare following surgical amputation: Secondary | ICD-10-CM | POA: Diagnosis not present

## 2016-12-13 NOTE — Telephone Encounter (Signed)
[facility]. [facility] beside commode [facility] in [facility] [phone]

## 2016-12-13 NOTE — Telephone Encounter (Signed)
[facility] for a 3 In 1 Toilet Commode.  Gave verbal orders for patient to have PT for 2x for 3 weeks and 1 x for 3 weeks.  CB#  Is [id number].

## 2016-12-14 DIAGNOSIS — E1151 Type 2 diabetes mellitus with diabetic peripheral angiopathy without gangrene: Secondary | ICD-10-CM | POA: Diagnosis not present

## 2016-12-14 DIAGNOSIS — Z4781 Encounter for orthopedic aftercare following surgical amputation: Secondary | ICD-10-CM | POA: Diagnosis not present

## 2016-12-15 DIAGNOSIS — E1151 Type 2 diabetes mellitus with diabetic peripheral angiopathy without gangrene: Secondary | ICD-10-CM | POA: Diagnosis not present

## 2016-12-15 DIAGNOSIS — Z4781 Encounter for orthopedic aftercare following surgical amputation: Secondary | ICD-10-CM | POA: Diagnosis not present

## 2016-12-18 ENCOUNTER — Telehealth (INDEPENDENT_AMBULATORY_CARE_PROVIDER_SITE_OTHER): Payer: Self-pay

## 2016-12-18 ENCOUNTER — Encounter (INDEPENDENT_AMBULATORY_CARE_PROVIDER_SITE_OTHER): Payer: Self-pay | Admitting: Orthopedic Surgery

## 2016-12-18 ENCOUNTER — Ambulatory Visit (INDEPENDENT_AMBULATORY_CARE_PROVIDER_SITE_OTHER): Payer: Medicare Other | Admitting: Orthopedic Surgery

## 2016-12-18 VITALS — Ht 72.0 in | Wt 200.0 lb

## 2016-12-18 DIAGNOSIS — E11621 Type 2 diabetes mellitus with foot ulcer: Secondary | ICD-10-CM

## 2016-12-18 DIAGNOSIS — Z89432 Acquired absence of left foot: Secondary | ICD-10-CM

## 2016-12-18 DIAGNOSIS — L97526 Non-pressure chronic ulcer of other part of left foot with bone involvement without evidence of necrosis: Secondary | ICD-10-CM

## 2016-12-18 NOTE — Addendum Note (Signed)
Addendum  created 12/18/16 1115 by Claudia Greenley, MD   Sign clinical note    

## 2016-12-18 NOTE — Progress Notes (Signed)
Office Visit Note   Patient: Stephen Fitzgerald           Date of Birth: [birth year]           MRN: [id number] Visit Date: 12/18/2016              Requested by: Bonney AidHaney, Alyssa A, MD [address] [place], [place] [id number] PCP: Bonney AidHaney, Alyssa A, MD  Chief Complaint  Patient presents with  . Left Foot - Routine Post Op    10/20/16 left transmet amputation      HPI: Patient presents in follow-up status post left transmetatarsal amputation. He has about 2 months out from surgery. Patient was discharged to skilled nursing. While in skilled nursing he and his father set up an appointment at the wound center. The skilled nursing facility stated that since he made an appointment with a outpatient wound center that they would have to discharge him from the skilled nursing facility. Patient states that he stayed there for about 7 weeks. Patient is been having home health nursing come by and apply a Mepilex dressing. Patient has been full weightbearing in a postoperative shoe.  Assessment & Plan: Visit Diagnoses:  1. S/P transmetatarsal amputation of foot, left (HCC)   2. 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 Discussed the importance of nonweightbearing on the left foot. He is to use his walker and hop. Patient was given instructions for Silvadene dressing changes daily we will place him in a foam boot to unload pressure from his foot. Discussed the importance of elevation to decrease the venous edema in the importance of nonweightbearing.  Follow-Up Instructions: Return in about 2 weeks (around 01/01/2017).   Ortho Exam  Patient is alert, oriented, no adenopathy, well-dressed, normal affect, normal respiratory effort. Examination patient has a ulcer over the dorsum of the left foot. After  informed consent 10 blade knife was used to debride the skin and soft tissue of necrotic fibrinous exudative tissue back to bleeding viable granulation tissue there is no exposed bone. Silver nitrate was used for hemostasis the ulcer is 3 cm in diameter. 3 mm deep. Iodosorb plus 4 x 4 plus an Ace wrap was applied. We will place the patient in a foam boot to unload pressure from his foot. Patient is also developed a plantar ulcer which is 3 cm in diameter 0.1 mm deep and has good beefy granulation tissue. Patient has developed a secondary to full weightbearing in a postoperative shoe. The transmetatarsal amputation incision has healed well without complications.  Imaging: No results found.  Labs: Lab Results  Component Value Date   HGBA1C 7.0 (H) 08/23/2016   HGBA1C 8.9 (H) 12/08/2015   HGBA1C 8.7 11/25/2015   ESRSEDRATE 118 (H) 08/11/2014   ESRSEDRATE 70 (H) 12/03/2012   CRP 24.4 (H) 08/11/2014   REPTSTATUS 08/18/2014 FINAL 08/11/2014   GRAMSTAIN  12/04/2012    NO WBC SEEN NO SQUAMOUS EPITHELIAL CELLS SEEN RARE GRAM POSITIVE COCCI IN PAIRS   CULT  08/11/2014    STAPHYLOCOCCUS SPECIES (COAGULASE NEGATIVE) Note: This organism is presumed to be Clindamycin resistant based on detection of inducible Clindamycin resistance. Note: Gram Stain Report Called to,Read Back By and Verified With: Gae GallopANGELA COLE RN [phone] [id number] Performed at [facility]  LABORGA STAPHYLOCOCCUS SPECIES (COAGULASE NEGATIVE) 08/11/2014    Orders:  No orders of the defined types were placed in this encounter.  No orders of the defined types were placed in this encounter.    Procedures: No procedures performed  Clinical Data: No additional findings.  ROS:  All other systems negative, except as noted in the HPI. Review of Systems  Objective: Vital Signs: Ht 6' (1.829 m)   Wt 200 lb (90.7 kg)   BMI 27.12 kg/m   Specialty Comments:  No specialty comments available.  PMFS History: Patient Active  Problem List   Diagnosis Date Noted  . Diabetic ulcer of left midfoot associated with type 2 diabetes mellitus, limited to breakdown of skin (HCC) 11/27/2016  . S/P transmetatarsal amputation of foot, left (HCC) 10/20/2016  . Healthcare maintenance 04/27/2015  . Foot swelling 12/16/2014  . Amputation of left hand ring finger 09/23/2014  . History of osteomyelitis 08/26/2014  . Serum albumin decreased 08/26/2014  . Vitamin B12 deficiency 08/18/2014  . Heart murmur, systolic   . Heart murmur 07/24/2013  . Hyperlipidemia 03/06/2013  . Hypertension 03/06/2013  . Tachycardia 01/13/2013  . Diabetes mellitus type 2 with complications, uncontrolled (HCC) 12/08/2012  . Expressive aphasia syndrome 12/08/2012  . 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 Abscess of hand, left 12/08/2012   left hand with only 3rd to 5th finger, 4th finger to midjoint.- reddened but dry and intact scar line.  . Arthritis    ankles & shoulders   . Bacteremia   . Blood poisoning   . Diabetes mellitus without complication (HCC)    not usually checking blood sugars daily.  . Flexor tenosynovitis of finger 12/08/2012  . Heart murmur   . Hypertension   . Stroke [facility])    expressive aphasia, R sided weakness   . Toe osteomyelitis, right (HCC)    right second toe    No family history on file.  Past Surgical History:  Procedure Laterality Date  . ABDOMINAL AORTAGRAM N/A 08/13/2014   Procedure: ABDOMINAL [name];  Surgeon: Fransisco Hertz, MD;  Location: [facility] CATH LAB;  Service: Cardiovascular;  Laterality: N/A;  . AMPUTATION Left 12/06/2012   Procedure: AMPUTATION DIGIT LEFT INDEX;  Surgeon: [name], MD;  Location: [facility] OR;  Service: Orthopedics;  Laterality: Left;  . AMPUTATION Left 08/15/2014   Procedure: AMPUTATION LEFT RING FINGER;  Surgeon: [name], MD;  Location: [facility] OR;  Service: Orthopedics;  Laterality: Left;  . AMPUTATION Right  08/15/2014   Procedure: AMPUTATION RIGHT FOOT FIRST RAY AMPUTATION AND IRRIGATION AND DEBREDMENT OF RIGHT FOOT ABSCESS;  Surgeon: [name], MD;  Location: [facility] OR;  Service: Orthopedics;  Laterality: Right;  . AMPUTATION Right 12/10/2015   Procedure: AMPUTATION RIGHT 2ND RAY;  Surgeon: [name], MD;  Location: [facility] ORS;  Service: Orthopedics;  Laterality: Right;  . AMPUTATION Left 08/24/2016   Procedure: LEFT GREAT TOE AMPUTATION;  Surgeon: [name], MD;  Location: [facility] ORS;  Service: Orthopedics;  Laterality: Left;  . AMPUTATION Left 10/20/2016   Procedure: Left Transmetatarsal Amputation, Application of Wound VAC;  Surgeon: [name], MD;  Location: [facility] OR;  Service: Orthopedics;  Laterality: Left;  . APPLICATION OF WOUND VAC Left 10/20/2016   foot  . CATARACT EXTRACTION W/ INTRAOCULAR LENS  IMPLANT, BILATERAL Bilateral   . I&D EXTREMITY Left 12/04/2012   Procedure: IRRIGATION AND DEBRIDEMENT Left Hand with Ring Removal  times  three, Carpal Tunnel , Flexor tendon Synovectomy;  Surgeon: [name], MD;  Location: [facility] OR;  Service: Orthopedics;  Laterality: Left;  . I&D EXTREMITY Left 12/06/2012   Procedure: IRRIGATION AND DEBRIDEMENT EXTREMITY LEFT HAND;  Surgeon: [name], MD;  Location: [facility] OR;  Service: Orthopedics;  Laterality: Left;  . I&D EXTREMITY Left 10/03/2016   Procedure: IRRIGATION AND DEBRIDEMENT LEFT 1ST METATARSAL with Wound Closure;  Surgeon: [name], MD;  Location: [facility] OR;  Service: Orthopedics;  Laterality: Left;  . INGUINAL HERNIA REPAIR Bilateral 1964  . TENDON REPAIR Left 12/23/2012   Procedure: DIVISION AND INSETTING FLAP TO LEFT RING FINGER ADJACENT TISSUE REARRANGEMENT LEFT INDEX FINGER AMPUTATION SITE;  Surgeon: [name], MD;  Location: [facility];  Service: Orthopedics;  Laterality: Left;  . TONSILLECTOMY    . TRANSMETATARSAL AMPUTATION Left 10/20/2016    Transmetatarsal Amputation,  Application of Wound VAC   Social History   Occupational History  . Not on file.   Social History Main Topics  . Smoking status: Never Smoker  . Smokeless tobacco: Never Used  . Alcohol use 33.6 oz/week    56 Cans of beer per week     Comment: 8-12 beers daily  . Drug use: No  . Sexual activity: No

## 2016-12-18 NOTE — Telephone Encounter (Signed)
Pt in office for appt and requesting appt to the wound center for a new ulcer on the top of his left transmet amputation. appt sch for 12/27/16 @9am  advised pt while in office.

## 2016-12-19 ENCOUNTER — Telehealth (INDEPENDENT_AMBULATORY_CARE_PROVIDER_SITE_OTHER): Payer: Self-pay | Admitting: Radiology

## 2016-12-19 DIAGNOSIS — Z4781 Encounter for orthopedic aftercare following surgical amputation: Secondary | ICD-10-CM | POA: Diagnosis not present

## 2016-12-19 DIAGNOSIS — E1151 Type 2 diabetes mellitus with diabetic peripheral angiopathy without gangrene: Secondary | ICD-10-CM | POA: Diagnosis not present

## 2016-12-19 NOTE — Telephone Encounter (Signed)
Stephen Fitzgerald calling triage yesterday wanting to know if patient is truly suppose to be nonweightbearing. [facility] Advised this is correct, office notes indicating all this faxed to [phone].

## 2016-12-21 DIAGNOSIS — E1151 Type 2 diabetes mellitus with diabetic peripheral angiopathy without gangrene: Secondary | ICD-10-CM | POA: Diagnosis not present

## 2016-12-21 DIAGNOSIS — Z4781 Encounter for orthopedic aftercare following surgical amputation: Secondary | ICD-10-CM | POA: Diagnosis not present

## 2016-12-25 DIAGNOSIS — E1151 Type 2 diabetes mellitus with diabetic peripheral angiopathy without gangrene: Secondary | ICD-10-CM | POA: Diagnosis not present

## 2016-12-25 DIAGNOSIS — Z4781 Encounter for orthopedic aftercare following surgical amputation: Secondary | ICD-10-CM | POA: Diagnosis not present

## 2016-12-27 ENCOUNTER — Other Ambulatory Visit: Payer: Self-pay | Admitting: Surgery

## 2016-12-27 ENCOUNTER — Ambulatory Visit (HOSPITAL_COMMUNITY)
Admission: RE | Admit: 2016-12-27 | Discharge: 2016-12-27 | Disposition: A | Discharge status: Home / Self Care | Payer: Medicare Other | Source: Ambulatory Visit | Attending: Surgery | Admitting: Surgery

## 2016-12-27 ENCOUNTER — Encounter (HOSPITAL_BASED_OUTPATIENT_CLINIC_OR_DEPARTMENT_OTHER): Payer: Medicare Other | Attending: Surgery

## 2016-12-27 DIAGNOSIS — Z7982 Long term (current) use of aspirin: Secondary | ICD-10-CM | POA: Insufficient documentation

## 2016-12-27 DIAGNOSIS — Z79899 Other long term (current) drug therapy: Secondary | ICD-10-CM | POA: Insufficient documentation

## 2016-12-27 DIAGNOSIS — M86172 Other acute osteomyelitis, left ankle and foot: Secondary | ICD-10-CM

## 2016-12-27 DIAGNOSIS — I10 Essential (primary) hypertension: Secondary | ICD-10-CM | POA: Diagnosis not present

## 2016-12-27 DIAGNOSIS — Z7984 Long term (current) use of oral hypoglycemic drugs: Secondary | ICD-10-CM | POA: Insufficient documentation

## 2016-12-27 DIAGNOSIS — L97522 Non-pressure chronic ulcer of other part of left foot with fat layer exposed: Secondary | ICD-10-CM | POA: Diagnosis not present

## 2016-12-27 DIAGNOSIS — L97529 Non-pressure chronic ulcer of other part of left foot with unspecified severity: Secondary | ICD-10-CM | POA: Insufficient documentation

## 2016-12-27 DIAGNOSIS — Z89432 Acquired absence of left foot: Secondary | ICD-10-CM | POA: Insufficient documentation

## 2016-12-27 DIAGNOSIS — I70244 Atherosclerosis of native arteries of left leg with ulceration of heel and midfoot: Secondary | ICD-10-CM | POA: Insufficient documentation

## 2016-12-27 DIAGNOSIS — E11621 Type 2 diabetes mellitus with foot ulcer: Secondary | ICD-10-CM | POA: Insufficient documentation

## 2016-12-27 DIAGNOSIS — T8189XA Other complications of procedures, not elsewhere classified, initial encounter: Secondary | ICD-10-CM | POA: Diagnosis not present

## 2016-12-27 DIAGNOSIS — Y835 Amputation of limb(s) as the cause of abnormal reaction of the patient, or of later complication, without mention of misadventure at the time of the procedure: Secondary | ICD-10-CM | POA: Insufficient documentation

## 2016-12-27 DIAGNOSIS — F1014 Alcohol abuse with alcohol-induced mood disorder: Secondary | ICD-10-CM | POA: Diagnosis not present

## 2016-12-27 DIAGNOSIS — Z89422 Acquired absence of other left toe(s): Secondary | ICD-10-CM | POA: Diagnosis not present

## 2016-12-27 DIAGNOSIS — Z791 Long term (current) use of non-steroidal anti-inflammatories (NSAID): Secondary | ICD-10-CM | POA: Insufficient documentation

## 2016-12-28 DIAGNOSIS — Z4781 Encounter for orthopedic aftercare following surgical amputation: Secondary | ICD-10-CM | POA: Diagnosis not present

## 2016-12-28 DIAGNOSIS — E1151 Type 2 diabetes mellitus with diabetic peripheral angiopathy without gangrene: Secondary | ICD-10-CM | POA: Diagnosis not present

## 2016-12-29 DIAGNOSIS — E1151 Type 2 diabetes mellitus with diabetic peripheral angiopathy without gangrene: Secondary | ICD-10-CM | POA: Diagnosis not present

## 2016-12-29 DIAGNOSIS — Z4781 Encounter for orthopedic aftercare following surgical amputation: Secondary | ICD-10-CM | POA: Diagnosis not present

## 2017-01-01 ENCOUNTER — Ambulatory Visit (INDEPENDENT_AMBULATORY_CARE_PROVIDER_SITE_OTHER): Payer: Medicare Other | Admitting: Orthopedic Surgery

## 2017-01-01 ENCOUNTER — Ambulatory Visit (HOSPITAL_COMMUNITY): Payer: Medicare Other

## 2017-01-01 DIAGNOSIS — E1151 Type 2 diabetes mellitus with diabetic peripheral angiopathy without gangrene: Secondary | ICD-10-CM | POA: Diagnosis not present

## 2017-01-01 DIAGNOSIS — Z4781 Encounter for orthopedic aftercare following surgical amputation: Secondary | ICD-10-CM | POA: Diagnosis not present

## 2017-01-02 ENCOUNTER — Ambulatory Visit (INDEPENDENT_AMBULATORY_CARE_PROVIDER_SITE_OTHER): Payer: Medicare Other | Admitting: Student

## 2017-01-02 ENCOUNTER — Telehealth: Payer: Self-pay | Admitting: Student

## 2017-01-02 ENCOUNTER — Encounter: Payer: Self-pay | Admitting: Student

## 2017-01-02 VITALS — BP 130/70 | HR 78 | Temp 98.9°F | Wt 214.0 lb

## 2017-01-02 DIAGNOSIS — E118 Type 2 diabetes mellitus with unspecified complications: Secondary | ICD-10-CM | POA: Diagnosis not present

## 2017-01-02 DIAGNOSIS — E1165 Type 2 diabetes mellitus with hyperglycemia: Secondary | ICD-10-CM | POA: Diagnosis not present

## 2017-01-02 DIAGNOSIS — Z89432 Acquired absence of left foot: Secondary | ICD-10-CM

## 2017-01-02 LAB — POCT GLYCOSYLATED HEMOGLOBIN (HGB A1C): Hemoglobin A1C: 6.8

## 2017-01-02 NOTE — Patient Instructions (Addendum)
Follow up in 3 months for Diabetes check Please call the office at [phone] for any concerns or needs you may have

## 2017-01-02 NOTE — Telephone Encounter (Signed)
Father brought in form from [facility] to be complete

## 2017-01-02 NOTE — Assessment & Plan Note (Signed)
A1c 6.8 today, improved from 7./0 at last check - will follow in three months - need eye appt, discuss at next visit - he has in past refused urine micro

## 2017-01-02 NOTE — Assessment & Plan Note (Signed)
Currently followed by wound care -will follow along as needed

## 2017-01-02 NOTE — Progress Notes (Signed)
   Subjective:    Patient ID: Stephen SprinklesJeffrey W Dungan, male    DOB: September 07, 1960, 56 y.o.   MRN: [id number]   CC: Follow up post op from left transmetatarsal amputation  HPI: 56 y/o M with poorly controlled T2DM presents post op from left transmetatarsal amputation  Follow up surgery - followed by ortho and wound care - he has no current issues with it today  T2D - reports compliance with glipizide and metformin - he does not check his CBGs at home - A1c today was 6.8  Smoking status reviewed  Review of Systems  Per HPI, else denies recent illness, fever, chest pain, shortness of breath,     Objective:  BP 130/70   Pulse 78   Temp 98.9 F (37.2 C) (Oral)   Wt 214 lb (97.1 kg)   SpO2 98%   BMI 29.02 kg/m  Vitals and nursing note reviewed  General: NAD Cardiac: RRR,  Respiratory: CTAB, normal effort Extremities: left foot in walker boot, using a walker Neuro: alert and oriented, no focal deficits   Assessment & Plan:    Diabetes mellitus type 2 with complications, uncontrolled A1c 6.8 today, improved from 7./0 at last check - will follow in three months - need eye appt, discuss at next visit - he has in past refused urine micro  S/P transmetatarsal amputation of foot, left (HCC) Currently followed by wound care -will follow along as needed    Alyssa A. Kennon RoundsHaney MD, MS Family Medicine Resident PGY-3 Pager [phone]

## 2017-01-03 DIAGNOSIS — E11621 Type 2 diabetes mellitus with foot ulcer: Secondary | ICD-10-CM | POA: Diagnosis not present

## 2017-01-03 DIAGNOSIS — L97522 Non-pressure chronic ulcer of other part of left foot with fat layer exposed: Secondary | ICD-10-CM | POA: Diagnosis not present

## 2017-01-04 ENCOUNTER — Other Ambulatory Visit: Payer: Self-pay | Admitting: Surgery

## 2017-01-04 ENCOUNTER — Telehealth (INDEPENDENT_AMBULATORY_CARE_PROVIDER_SITE_OTHER): Payer: Self-pay | Admitting: Orthopedic Surgery

## 2017-01-04 DIAGNOSIS — Z4781 Encounter for orthopedic aftercare following surgical amputation: Secondary | ICD-10-CM | POA: Diagnosis not present

## 2017-01-04 DIAGNOSIS — M869 Osteomyelitis, unspecified: Secondary | ICD-10-CM

## 2017-01-04 DIAGNOSIS — E1151 Type 2 diabetes mellitus with diabetic peripheral angiopathy without gangrene: Secondary | ICD-10-CM | POA: Diagnosis not present

## 2017-01-04 NOTE — Telephone Encounter (Signed)
[facility]

## 2017-01-05 ENCOUNTER — Ambulatory Visit (HOSPITAL_COMMUNITY)
Admission: RE | Admit: 2017-01-05 | Discharge: 2017-01-05 | Disposition: A | Discharge status: Home / Self Care | Payer: Medicare Other | Source: Ambulatory Visit | Attending: Vascular Surgery | Admitting: Vascular Surgery

## 2017-01-05 DIAGNOSIS — L97309 Non-pressure chronic ulcer of unspecified ankle with unspecified severity: Secondary | ICD-10-CM | POA: Diagnosis not present

## 2017-01-05 DIAGNOSIS — R937 Abnormal findings on diagnostic imaging of other parts of musculoskeletal system: Secondary | ICD-10-CM | POA: Insufficient documentation

## 2017-01-05 DIAGNOSIS — L97909 Non-pressure chronic ulcer of unspecified part of unspecified lower leg with unspecified severity: Secondary | ICD-10-CM | POA: Insufficient documentation

## 2017-01-06 ENCOUNTER — Ambulatory Visit (HOSPITAL_COMMUNITY): Payer: Medicare Other

## 2017-01-06 ENCOUNTER — Ambulatory Visit (HOSPITAL_COMMUNITY)
Admission: RE | Admit: 2017-01-06 | Discharge: 2017-01-06 | Disposition: A | Discharge status: Home / Self Care | Payer: Medicare Other | Source: Ambulatory Visit | Attending: Surgery | Admitting: Surgery

## 2017-01-06 DIAGNOSIS — R937 Abnormal findings on diagnostic imaging of other parts of musculoskeletal system: Secondary | ICD-10-CM | POA: Diagnosis not present

## 2017-01-06 DIAGNOSIS — L97909 Non-pressure chronic ulcer of unspecified part of unspecified lower leg with unspecified severity: Secondary | ICD-10-CM | POA: Diagnosis not present

## 2017-01-06 DIAGNOSIS — M869 Osteomyelitis, unspecified: Secondary | ICD-10-CM

## 2017-01-06 LAB — POCT I-STAT CREATININE: Creatinine, Ser: 0.8 mg/dL (ref 0.61–1.24)

## 2017-01-06 MED ORDER — GADOBENATE DIMEGLUMINE 529 MG/ML IV SOLN
20.0000 mL | Freq: Once | INTRAVENOUS | Status: AC | PRN
Start: 2017-01-06 — End: 2017-01-06
  Administered 2017-01-06: 20 mL via INTRAVENOUS

## 2017-01-08 ENCOUNTER — Other Ambulatory Visit: Payer: Self-pay | Admitting: Surgery

## 2017-01-08 DIAGNOSIS — L97309 Non-pressure chronic ulcer of unspecified ankle with unspecified severity: Secondary | ICD-10-CM

## 2017-01-08 DIAGNOSIS — E1151 Type 2 diabetes mellitus with diabetic peripheral angiopathy without gangrene: Secondary | ICD-10-CM | POA: Diagnosis not present

## 2017-01-08 DIAGNOSIS — Z4781 Encounter for orthopedic aftercare following surgical amputation: Secondary | ICD-10-CM | POA: Diagnosis not present

## 2017-01-09 DIAGNOSIS — Z4781 Encounter for orthopedic aftercare following surgical amputation: Secondary | ICD-10-CM | POA: Diagnosis not present

## 2017-01-09 DIAGNOSIS — E1151 Type 2 diabetes mellitus with diabetic peripheral angiopathy without gangrene: Secondary | ICD-10-CM | POA: Diagnosis not present

## 2017-01-10 ENCOUNTER — Ambulatory Visit (HOSPITAL_COMMUNITY): Payer: Medicare Other

## 2017-01-10 DIAGNOSIS — L97522 Non-pressure chronic ulcer of other part of left foot with fat layer exposed: Secondary | ICD-10-CM | POA: Diagnosis not present

## 2017-01-10 DIAGNOSIS — E11621 Type 2 diabetes mellitus with foot ulcer: Secondary | ICD-10-CM | POA: Diagnosis not present

## 2017-01-12 ENCOUNTER — Telehealth (INDEPENDENT_AMBULATORY_CARE_PROVIDER_SITE_OTHER): Payer: Self-pay | Admitting: Orthopedic Surgery

## 2017-01-12 ENCOUNTER — Other Ambulatory Visit: Payer: Self-pay

## 2017-01-12 DIAGNOSIS — I739 Peripheral vascular disease, unspecified: Secondary | ICD-10-CM

## 2017-01-12 NOTE — Telephone Encounter (Signed)
RECORDS MAILED AS WAS TOO MANY PAGES TO FAX [phone]

## 2017-01-12 NOTE — Telephone Encounter (Signed)
COPY OF ALL RECORDS MAILED TO  [facility]TH[facility] (PO ADDRESS AS WAS INDICATED ON THEIR RELEASE)

## 2017-01-16 DIAGNOSIS — Z4781 Encounter for orthopedic aftercare following surgical amputation: Secondary | ICD-10-CM | POA: Diagnosis not present

## 2017-01-16 DIAGNOSIS — E1151 Type 2 diabetes mellitus with diabetic peripheral angiopathy without gangrene: Secondary | ICD-10-CM | POA: Diagnosis not present

## 2017-01-18 DIAGNOSIS — E1151 Type 2 diabetes mellitus with diabetic peripheral angiopathy without gangrene: Secondary | ICD-10-CM | POA: Diagnosis not present

## 2017-01-18 DIAGNOSIS — Z4781 Encounter for orthopedic aftercare following surgical amputation: Secondary | ICD-10-CM | POA: Diagnosis not present

## 2017-01-22 DIAGNOSIS — L97524 Non-pressure chronic ulcer of other part of left foot with necrosis of bone: Secondary | ICD-10-CM | POA: Diagnosis not present

## 2017-01-22 DIAGNOSIS — E1151 Type 2 diabetes mellitus with diabetic peripheral angiopathy without gangrene: Secondary | ICD-10-CM | POA: Diagnosis not present

## 2017-01-22 DIAGNOSIS — E11621 Type 2 diabetes mellitus with foot ulcer: Secondary | ICD-10-CM | POA: Diagnosis not present

## 2017-01-22 DIAGNOSIS — Z4781 Encounter for orthopedic aftercare following surgical amputation: Secondary | ICD-10-CM | POA: Diagnosis not present

## 2017-01-24 ENCOUNTER — Encounter (HOSPITAL_BASED_OUTPATIENT_CLINIC_OR_DEPARTMENT_OTHER): Payer: Medicare Other | Attending: Surgery

## 2017-01-24 ENCOUNTER — Other Ambulatory Visit (HOSPITAL_COMMUNITY)
Admission: RE | Admit: 2017-01-24 | Discharge: 2017-01-24 | Disposition: A | Discharge status: Home / Self Care | Payer: Medicare Other | Source: Other Acute Inpatient Hospital | Attending: Internal Medicine | Admitting: Internal Medicine

## 2017-01-24 DIAGNOSIS — T8131XA Disruption of external operation (surgical) wound, not elsewhere classified, initial encounter: Secondary | ICD-10-CM | POA: Diagnosis not present

## 2017-01-24 DIAGNOSIS — Y835 Amputation of limb(s) as the cause of abnormal reaction of the patient, or of later complication, without mention of misadventure at the time of the procedure: Secondary | ICD-10-CM | POA: Diagnosis not present

## 2017-01-24 DIAGNOSIS — Z89432 Acquired absence of left foot: Secondary | ICD-10-CM | POA: Diagnosis not present

## 2017-01-24 DIAGNOSIS — M86372 Chronic multifocal osteomyelitis, left ankle and foot: Secondary | ICD-10-CM | POA: Diagnosis not present

## 2017-01-24 DIAGNOSIS — I10 Essential (primary) hypertension: Secondary | ICD-10-CM | POA: Diagnosis not present

## 2017-01-24 DIAGNOSIS — E11621 Type 2 diabetes mellitus with foot ulcer: Secondary | ICD-10-CM | POA: Insufficient documentation

## 2017-01-24 DIAGNOSIS — I70244 Atherosclerosis of native arteries of left leg with ulceration of heel and midfoot: Secondary | ICD-10-CM | POA: Insufficient documentation

## 2017-01-24 DIAGNOSIS — L97522 Non-pressure chronic ulcer of other part of left foot with fat layer exposed: Secondary | ICD-10-CM | POA: Diagnosis not present

## 2017-01-24 DIAGNOSIS — L97526 Non-pressure chronic ulcer of other part of left foot with bone involvement without evidence of necrosis: Secondary | ICD-10-CM | POA: Diagnosis not present

## 2017-01-24 DIAGNOSIS — F1014 Alcohol abuse with alcohol-induced mood disorder: Secondary | ICD-10-CM | POA: Insufficient documentation

## 2017-01-24 DIAGNOSIS — Z89022 Acquired absence of left finger(s): Secondary | ICD-10-CM | POA: Diagnosis not present

## 2017-01-29 DIAGNOSIS — E1151 Type 2 diabetes mellitus with diabetic peripheral angiopathy without gangrene: Secondary | ICD-10-CM | POA: Diagnosis not present

## 2017-01-29 DIAGNOSIS — Z4781 Encounter for orthopedic aftercare following surgical amputation: Secondary | ICD-10-CM | POA: Diagnosis not present

## 2017-01-29 LAB — AEROBIC/ANAEROBIC CULTURE (SURGICAL/DEEP WOUND)

## 2017-01-29 LAB — AEROBIC/ANAEROBIC CULTURE W GRAM STAIN (SURGICAL/DEEP WOUND): Special Requests: NORMAL

## 2017-01-31 DIAGNOSIS — L97522 Non-pressure chronic ulcer of other part of left foot with fat layer exposed: Secondary | ICD-10-CM | POA: Diagnosis not present

## 2017-01-31 DIAGNOSIS — E11621 Type 2 diabetes mellitus with foot ulcer: Secondary | ICD-10-CM | POA: Diagnosis not present

## 2017-02-05 ENCOUNTER — Encounter: Payer: Self-pay | Admitting: Vascular Surgery

## 2017-02-05 DIAGNOSIS — E1151 Type 2 diabetes mellitus with diabetic peripheral angiopathy without gangrene: Secondary | ICD-10-CM | POA: Diagnosis not present

## 2017-02-05 DIAGNOSIS — Z4781 Encounter for orthopedic aftercare following surgical amputation: Secondary | ICD-10-CM | POA: Diagnosis not present

## 2017-02-07 DIAGNOSIS — L97522 Non-pressure chronic ulcer of other part of left foot with fat layer exposed: Secondary | ICD-10-CM | POA: Diagnosis not present

## 2017-02-07 DIAGNOSIS — E11621 Type 2 diabetes mellitus with foot ulcer: Secondary | ICD-10-CM | POA: Diagnosis not present

## 2017-02-12 DIAGNOSIS — Z4781 Encounter for orthopedic aftercare following surgical amputation: Secondary | ICD-10-CM | POA: Diagnosis not present

## 2017-02-12 DIAGNOSIS — E1151 Type 2 diabetes mellitus with diabetic peripheral angiopathy without gangrene: Secondary | ICD-10-CM | POA: Diagnosis not present

## 2017-02-13 ENCOUNTER — Encounter: Payer: Self-pay | Admitting: Vascular Surgery

## 2017-02-13 ENCOUNTER — Ambulatory Visit: Payer: Medicare Other | Admitting: Internal Medicine

## 2017-02-13 ENCOUNTER — Ambulatory Visit (INDEPENDENT_AMBULATORY_CARE_PROVIDER_SITE_OTHER): Payer: Medicare Other | Admitting: Vascular Surgery

## 2017-02-13 ENCOUNTER — Ambulatory Visit (HOSPITAL_COMMUNITY)
Admission: RE | Admit: 2017-02-13 | Discharge: 2017-02-13 | Disposition: A | Discharge status: Home / Self Care | Payer: Medicare Other | Source: Ambulatory Visit | Attending: Vascular Surgery | Admitting: Vascular Surgery

## 2017-02-13 VITALS — BP 156/87 | HR 100 | Temp 97.6°F | Resp 16 | Ht 72.0 in | Wt 220.0 lb

## 2017-02-13 DIAGNOSIS — I739 Peripheral vascular disease, unspecified: Secondary | ICD-10-CM | POA: Diagnosis not present

## 2017-02-13 DIAGNOSIS — Z89411 Acquired absence of right great toe: Secondary | ICD-10-CM | POA: Diagnosis not present

## 2017-02-13 LAB — VAS US LOWER EXTREMITY ARTERIAL DUPLEX
RIGHT ANT DIST TIBAL SYS PSV: 137 cm/s
RSFDPSV: -63 cm/s
Right super femoral mid sys PSV: -80 cm/s
Right super femoral prox sys PSV: 59 cm/s

## 2017-02-13 NOTE — Progress Notes (Signed)
HISTORY AND PHYSICAL     CC:  Evaluate for non healing wound Requesting Provider:  Shirley, Swazil[place], DO  HPI: This is a 56 y.o. male who is here with his father.  He states that the wound on the top of his foot has been present since October 20, 2016.  The pt's father tells me that Dr. Magnus IvanBlackman did the amputation of the right great toe and the pt did very well.  They tried to get him to evaluate him, however he was sent to Dr. Lajoyce Cornersuda.  The pt underwent a left TMA 10/20/16.  This has healed well.  However, the wound on the top of the foot did not heal.  After the TMA, the pt was in a nursing home for 7 weeks.  The father is unhappy with the wound care at the center.  He states he asked Dr. Lajoyce Cornersuda to refer him and he was referred to the wound center with Dr. Meyer RusselBritto.  Dr. Meyer RusselBritto sent the pt to Dr. Victorino DikeHewitt and at that time, his Xrays and MRI was suspicious for osteomyelitis and was told he needed an amputation.  The pt and his father returned to the wound center and a bx was taken of the bone and was negative for osteomyelitis.  He has been receiving local wound care with Santyl and the father states the wound has started to look better over the past month.    He is referred to [facility] for further evaluation.   He did have an arteriogram by Dr. Imogene Burnhen in 2016 that revealed:   FINDING(S):  Aorta:  patent  Superior mesenteric artery: patent  Celiac artery: patent   Right Left  RA Patent, co-dominant pole arteries Patent, co-dominant pole arteries  CIA Patent Patent  EIA Patent Patent  IIA Patent Patent  CFA Patent Patent  SFA Patent Patent  PFA Patent Patent  Pop Patent Patent  Trif Patent Patent  AT Patent Patent, difficult to visualize distally due to movement  Pero Patent Patent, difficult to visualize distally due to movement  PT Dwindles after takeoff Not well visualized    The pt is on a statin for cholesterol management.  He takes a daily aspirin. He is on oral agents for diabetes, which he has  had since his mid 20's.  His father says his A1c at last check was 6.8%.    He has a hx of stroke in 1999 and has right sided weakness and numbness.  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  He is on a beta blocker for blood pressure management.     Past Medical History:  Diagnosis Date  . Abscess of hand, left 12/08/2012   left hand with only 3rd to 5th finger, 4th finger to midjoint.- reddened but dry and intact scar line.  . Arthritis    ankles & shoulders   . Bacteremia   . Blood poisoning   . Diabetes mellitus without complication (HCC)    not usually checking blood sugars daily.  . Flexor tenosynovitis of finger 12/08/2012  . Heart murmur   . Hypertension   . Stroke [facility](HCC)    expressive aphasia, R sided weakness   . Toe osteomyelitis, right (HCC)    right second toe    Past Surgical History:  Procedure Laterality Date  . ABDOMINAL AORTAGRAM N/A 08/13/2014   Procedure: ABDOMINAL Ronny FlurryAORTAGRAM;  Surgeon: Fransisco HertzBrian L Chen, MD;  [facility];  Service: Cardiovascular;  Laterality: N/A;  . AMPUTATION Left 12/06/2012   Procedure: AMPUTATION DIGIT LEFT INDEX;  Surgeon: [name], MD;  Location: [facility] OR;  Service: Orthopedics;  Laterality: Left;  . AMPUTATION Left 08/15/2014   Procedure: AMPUTATION LEFT RING FINGER;  Surgeon: [name], MD;  Location: [facility] OR;  Service: Orthopedics;  Laterality: Left;  . AMPUTATION Right 08/15/2014   Procedure: AMPUTATION RIGHT FOOT FIRST RAY AMPUTATION AND IRRIGATION AND DEBREDMENT OF RIGHT FOOT ABSCESS;  Surgeon: [name], MD;  Location: [facility] OR;  Service: Orthopedics;  Laterality: Right;  . AMPUTATION Right 12/10/2015   Procedure: AMPUTATION RIGHT 2ND RAY;  Surgeon: [name], MD;  Location: [facility] ORS;  Service: Orthopedics;  Laterality: Right;  . AMPUTATION Left 08/24/2016   Procedure: LEFT GREAT TOE AMPUTATION;  Surgeon: [name], MD;  Location: [facility] ORS;  Service:  Orthopedics;  Laterality: Left;  . AMPUTATION Left 10/20/2016   Procedure: Left Transmetatarsal Amputation, Application of Wound VAC;  Surgeon: [name], MD;  Location: [facility] OR;  Service: Orthopedics;  Laterality: Left;  . APPLICATION OF WOUND VAC Left 10/20/2016   foot  . CATARACT EXTRACTION W/ INTRAOCULAR LENS  IMPLANT, BILATERAL Bilateral   . I&D EXTREMITY Left 12/04/2012   Procedure: IRRIGATION AND DEBRIDEMENT Left Hand with Ring Removal  times three, Carpal Tunnel , Flexor tendon Synovectomy;  Surgeon: [name], MD;  Location: [facility] OR;  Service: Orthopedics;  Laterality: Left;  . I&D EXTREMITY Left 12/06/2012   Procedure: IRRIGATION AND DEBRIDEMENT EXTREMITY LEFT HAND;  Surgeon: [name], MD;  Location: [facility] OR;  Service: Orthopedics;  Laterality: Left;  . I&D EXTREMITY Left 10/03/2016   Procedure: IRRIGATION AND DEBRIDEMENT LEFT 1ST METATARSAL with Wound Closure;  Surgeon: [name], MD;  Location: [facility] OR;  Service: Orthopedics;  Laterality: Left;  . INGUINAL HERNIA REPAIR Bilateral 1964  . TENDON REPAIR Left 12/23/2012   Procedure: DIVISION AND INSETTING FLAP TO LEFT RING FINGER ADJACENT TISSUE REARRANGEMENT LEFT INDEX FINGER AMPUTATION SITE;  Surgeon: [name], MD;  Location: [facility];  Service: Orthopedics;  Laterality: Left;  . TONSILLECTOMY    . TRANSMETATARSAL AMPUTATION Left 10/20/2016    Transmetatarsal Amputation, Application of Wound VAC    Allergies  Allergen Reactions  . Unasyn [Ampicillin-Sulbactam Sodium] Rash    Has patient had a PCN reaction causing immediate rash, facial/tongue/throat swelling, SOB or lightheadedness with hypotension: No Has patient had a PCN reaction causing severe rash involving mucus membranes or skin necrosis: No Has patient had a PCN reaction that required hospitalization: No Has patient had a PCN reaction occurring within the last 10 years: No If all of the above answers are "NO", then may proceed  with Cephalosporin use.     Current Outpatient Prescriptions  Medication Sig Dispense Refill  . aspirin 81 MG tablet Take 1 tablet (81 mg total) by mouth daily. 90 tablet 3  . aspirin-sod bicarb-citric acid (ALKA-SELTZER) 325 MG TBEF tablet Take 325 mg by mouth every 6 (six) hours as needed.    Marland Kitchen atorvastatin (LIPITOR) 40 MG tablet TAKE 1 TABLET BY MOUTH DAILY 90 tablet 3  . collagenase (SANTYL) ointment Apply 1 application topically daily.    . Cyanocobalamin (B-12) 2000 MCG TABS Take 2,000 mcg by mouth daily.     Marland Kitchen glipiZIDE (GLUCOTROL) 5 MG tablet TAKE 1 TABLET BY MOUTH TWICE DAILY BEFORE MEALS 180 tablet 3  . metFORMIN (GLUCOPHAGE-XR) 500 MG 24 hr tablet TAKE 4 TABLETS BY  MOUTH EVERY DAY WITH BREAKFAST (Patient taking differently: TAKE 2 TABLETS BY MOUTH TWICE A DAY WITH MEALS.) 360 tablet 3  . metoprolol succinate (TOPROL-XL) 50 MG 24 hr tablet Take 1 tablet (50 mg total) by mouth daily. Take with or immediately following a meal. 90 tablet 3  . Multiple Vitamins-Minerals (CENTRUM SILVER 50+MEN PO) Take by mouth.    . Naphazoline-Glycerin (CLEAR EYES MAX REDNESS RELIEF OP) Apply 2 drops to eye daily as needed (dry eyes).     . naproxen sodium (ANAPROX) 220 MG tablet Take 220 mg by mouth 2 (two) times daily as needed (pain).    Marland Kitchen HYDROcodone-acetaminophen (NORCO) 5-325 MG tablet Take 1-2 tablets by mouth every 6 (six) hours as needed for moderate pain. (Patient not taking: Reported on 01/02/2017) 30 tablet 0  . HYDROcodone-acetaminophen (NORCO) 5-325 MG tablet Take 1 tablet by mouth every 6 (six) hours as needed. (Patient not taking: Reported on 01/02/2017) 30 tablet 0  . silver sulfADIAZINE (SILVADENE) 1 % cream Apply 1 application topically daily. (Patient not taking: Reported on 01/02/2017) 50 g 0   No current facility-administered medications for this visit.     History reviewed. No pertinent family history.  Social History   Social History  . Marital status: Divorced    Spouse  name: N/A  . Number of children: N/A  . Years of education: N/A   Occupational History  . Not on file.   Social History Main Topics  . Smoking status: Never Smoker  . Smokeless tobacco: Never Used  . Alcohol use 33.6 oz/week    56 Cans of beer per week     Comment: 8-12 beers daily  . Drug use: No  . Sexual activity: No   Other Topics Concern  . Not on file   Social History Narrative   Pt lives alone. His father checks  in on him occasionally.     REVIEW OF SYSTEMS:   [X]  denotes positive finding, [ ]  denotes negative finding Cardiac  Comments:  Chest pain or chest pressure:    Shortness of breath upon exertion:    Short of breath when lying flat:    Irregular heart rhythm:        Vascular    Pain in calf, thigh, or hip brought on by ambulation:    Pain in feet at night that wakes you up from your sleep:     Blood clot in your veins:    Leg swelling:         Pulmonary    Oxygen at home:    Productive cough:     Wheezing:         Neurologic    weakness in right arm or leg x   numbness in right arm or leg x   Sudden onset of difficulty speaking or slurred speech:    Temporary loss of vision in one eye:     Problems with dizziness:         Gastrointestinal    Blood in stool:     Vomited blood:         Genitourinary    Burning when urinating:     Blood in urine:        Psychiatric    Major depression:         Hematologic    Bleeding problems:    Problems with blood clotting too easily:        Skin    Rashes or ulcers: x See HPI  Constitutional    Fever or chills:      PHYSICAL EXAMINATION:  Vitals:   02/13/17 1237 02/13/17 1238  BP: (!) 154/89 (!) 156/87  Pulse: 99 100  Resp: 16   Temp: 97.6 F (36.4 C)    Body mass index is 29.84 kg/m.  General:  WDWN in NAD; vital signs documented above Gait: Not observed HENT: WNL, normocephalic Pulmonary: normal non-labored breathing , without Rales, rhonchi,  wheezing Cardiac: regular HR,  with murmur without carotid bruits Skin: without rashes; well healed right great toe amp site and left TMA site Vascular Exam/Pulses:  Right Left  Radial 1+ (weak) Wrist gaurd  Ulnar Unable to palpate  Wrist gaurd  Popliteal  2+ (normal)  DP Monophasic doppler signal Biphasic doppler signal  PT Biphasic doppler signal Biphasic doppler signal  Peroneal Biphasic doppler signal Biphasic doppler signal   Extremities:+non healing wound left dorsum and left lateral foot at TMA incision (does not include scar) - see pictures below.   Left dorsum foot  Left lateral foot  Bilateral feet  Musculoskeletal: no muscle wasting or atrophy  Neurologic: A&O X 3;  No focal weakness or paresthesias are detected Psychiatric:  The pt has Normal affect.   Non-Invasive Vascular Imaging:   ABI's & Lower extremity arterial duplex (left) on 02/13/17: Right and Left are non compressible -Patent left common femoral, SFA, deep femoral, popliteal arteries are without evidence of significant stenosis.  Calcified vessels are noted.   Pt meds includes: Statin:  Yes.   Beta Blocker:  Yes.   Aspirin:  Yes.   ACEI:  No. ARB:  No. CCB use:  No Other Antiplatelet/Anticoagulant:  No   ASSESSMENT/PLAN:: 56 y.o. male with diabetes with non healing wound left foot.   -pt with non healing wound on the dorsum and lateral aspect of the left foot.  Father of pt states the wound has gotten better over the past month.  He has been using Santyl daily.  ABI's today revealed the pt has non compressible vessels but given his diabetes, this would be expected.  -According to the arteriogram the pt had in 2016 and the doppler signals present on exam today, he should have adequate blood flow to heal the wounds.   -pt does have a heart murmur that he says he has known about since childhood and is being monitored.  -pt will follow up as needed.    Doreatha MassedSamantha Noor Witte, PA-C [facility] [phone]  Clinic  MD:  Pt seen and examined with Dr. Arbie CookeyEarly   I have examined the patient, reviewed and agree with above. Discussed at length with the patient and his father present. Palpable popliteal pulse and excellent signals at the level of his ankle. 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 No option for revascularization.  Gretta BeganEarly, Todd, MD 02/13/2017 2:13 PM

## 2017-02-14 ENCOUNTER — Encounter (HOSPITAL_BASED_OUTPATIENT_CLINIC_OR_DEPARTMENT_OTHER): Payer: Medicare Other | Attending: Surgery

## 2017-02-14 DIAGNOSIS — L97522 Non-pressure chronic ulcer of other part of left foot with fat layer exposed: Secondary | ICD-10-CM | POA: Insufficient documentation

## 2017-02-14 DIAGNOSIS — Y835 Amputation of limb(s) as the cause of abnormal reaction of the patient, or of later complication, without mention of misadventure at the time of the procedure: Secondary | ICD-10-CM | POA: Insufficient documentation

## 2017-02-14 DIAGNOSIS — Z89431 Acquired absence of right foot: Secondary | ICD-10-CM | POA: Insufficient documentation

## 2017-02-14 DIAGNOSIS — Z89022 Acquired absence of left finger(s): Secondary | ICD-10-CM | POA: Diagnosis not present

## 2017-02-14 DIAGNOSIS — M86372 Chronic multifocal osteomyelitis, left ankle and foot: Secondary | ICD-10-CM | POA: Diagnosis not present

## 2017-02-14 DIAGNOSIS — I70244 Atherosclerosis of native arteries of left leg with ulceration of heel and midfoot: Secondary | ICD-10-CM | POA: Diagnosis not present

## 2017-02-14 DIAGNOSIS — T8781 Dehiscence of amputation stump: Secondary | ICD-10-CM | POA: Insufficient documentation

## 2017-02-14 DIAGNOSIS — Z89432 Acquired absence of left foot: Secondary | ICD-10-CM | POA: Diagnosis not present

## 2017-02-14 DIAGNOSIS — F1014 Alcohol abuse with alcohol-induced mood disorder: Secondary | ICD-10-CM | POA: Diagnosis not present

## 2017-02-14 DIAGNOSIS — E11621 Type 2 diabetes mellitus with foot ulcer: Secondary | ICD-10-CM | POA: Insufficient documentation

## 2017-02-16 ENCOUNTER — Telehealth: Payer: Self-pay | Admitting: Family Medicine

## 2017-02-16 NOTE — Telephone Encounter (Signed)
Unsuccessful contact with pt. Left VM inquiring about eye exam. Called mobile number and got in contact with father, Chrissie NoaWilliam. Pt had a dilated [facility] Oct (2017) [facility] and he was told his eyes are in good shape. Follow-up is mid Oct 2018. I let Chrissie NoaWilliam know he should send in the results and he understood.  He mentions pt needs a refill on lipitor and metformin. I let him know pt may need to be seen for refills and would let his PCP know. - Mesha Guinyard

## 2017-02-19 DIAGNOSIS — Z4781 Encounter for orthopedic aftercare following surgical amputation: Secondary | ICD-10-CM | POA: Diagnosis not present

## 2017-02-19 DIAGNOSIS — E1151 Type 2 diabetes mellitus with diabetic peripheral angiopathy without gangrene: Secondary | ICD-10-CM | POA: Diagnosis not present

## 2017-02-20 ENCOUNTER — Encounter: Payer: Self-pay | Admitting: Internal Medicine

## 2017-02-20 ENCOUNTER — Ambulatory Visit (INDEPENDENT_AMBULATORY_CARE_PROVIDER_SITE_OTHER): Payer: Medicare Other | Admitting: Internal Medicine

## 2017-02-20 DIAGNOSIS — E11621 Type 2 diabetes mellitus with foot ulcer: Secondary | ICD-10-CM | POA: Diagnosis not present

## 2017-02-20 DIAGNOSIS — L97421 Non-pressure chronic ulcer of left heel and midfoot limited to breakdown of skin: Secondary | ICD-10-CM

## 2017-02-20 NOTE — Progress Notes (Signed)
[facility]  Reason for Consult: diabetic foot ulcer Referring Physician: Dr. Shirley Swaziland  Assessment: I'm not absolutely convinced that he has postoperative osteomyelitis. The MRI findings may be nonspecific bony changes related to his recent transmetatarsal amputation. The bone culture was fairly unimpressive. He is improving and the ulcer is healing without antibiotic therapy. I favor continued local wound care and observation off of antibiotics for now. He will follow-up with me in 3-4 weeks.   Plan: 1. Continue observation off of antibiotics 2. Continue local wound care 3. Follow-up here in 4 weeks   Patient Active Problem List   Diagnosis Date Noted  . Diabetic ulcer of left midfoot associated with type 2 diabetes mellitus, limited to breakdown of skin (HCC) 11/27/2016    Priority: High  . S/P transmetatarsal amputation of foot, left (HCC) 10/20/2016  . Healthcare maintenance 04/27/2015  . Foot swelling 12/16/2014  . Amputation of left hand ring finger 09/23/2014  . History of osteomyelitis 08/26/2014  . Serum albumin decreased 08/26/2014  . Vitamin B12 deficiency 08/18/2014  . Heart murmur, systolic   . Heart murmur 07/24/2013  . Hyperlipidemia 03/06/2013  . Hypertension 03/06/2013  . Tachycardia 01/13/2013  . Diabetes mellitus type 2 with complications, uncontrolled (HCC) 12/08/2012  . Expressive aphasia syndrome 12/08/2012  . 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   ASPIRIN-SOD BICARB-CITRIC ACID (ALKA-SELTZER) 325 MG TBEF TABLET    Take 325 mg by mouth every 6 (six) hours as needed.   ATORVASTATIN (LIPITOR) 40 MG TABLET    TAKE 1 TABLET BY MOUTH DAILY   CYANOCOBALAMIN (B-12) 2000 MCG TABS    Take 2,000 mcg by mouth daily.    GLIPIZIDE (GLUCOTROL) 5 MG  TABLET    TAKE 1 TABLET BY MOUTH TWICE DAILY BEFORE MEALS   HYDROCODONE-ACETAMINOPHEN (NORCO) 5-325 MG TABLET    Take 1-2 tablets by mouth every 6 (six) hours as needed for moderate pain.   HYDROCODONE-ACETAMINOPHEN (NORCO) 5-325 MG TABLET    Take 1 tablet by mouth every 6 (six) hours as needed.   METFORMIN (GLUCOPHAGE-XR) 500 MG 24 HR TABLET    TAKE 4 TABLETS BY MOUTH EVERY DAY WITH BREAKFAST   METOPROLOL SUCCINATE (TOPROL-XL) 50 MG 24 HR TABLET    Take 1 tablet (50 mg total) by mouth daily. Take with or immediately following a meal.   MULTIPLE VITAMINS-MINERALS (CENTRUM SILVER 50+MEN PO)    Take by mouth.   NAPHAZOLINE-GLYCERIN (CLEAR EYES MAX REDNESS RELIEF OP)    Apply 2 drops to eye daily as needed (dry eyes).    NAPROXEN SODIUM (ANAPROX) 220 MG TABLET    Take 220 mg by mouth 2 (two) times daily as needed (pain).   SILVER SULFADIAZINE (SILVADENE) 1 % CREAM    Apply 1 application topically daily.  Modified Medications   No medications on file  Discontinued Medications   COLLAGENASE (SANTYL) OINTMENT    Apply 1 application topically daily.    HPI: Stephen Fitzgerald is a 56 y.o. male with diabetes, neuropathy and severe small vessel arterial disease. He has a history of previous foot infections. Early this year he developed infection of his left great toe and underwent amputation by Dr. Magnus Fitzgerald in February. He had breakdown of the wound  postoperatively and developed gangrene requiring transmetatarsal amputation by Dr. Lajoyce Fitzgerald on 10/20/2016. His father, who is with him today, states that he had developed a small ulcer on the dorsum of his left foot just before that surgery. After surgery he went to a skilled nursing facility but they were not happy with the wound care he was getting there. He was referred to Dr. Meyer Fitzgerald at the wound care center. Dr. Meyer Fitzgerald obtained an MRI on 01/06/2017 which showed findings consistent with osteomyelitis involving the distal aspects of the middle and medial cuneiforms.  He was referred to Dr. Toni Fitzgerald for a second opinion. Stephen Fitzgerald father tells me that Dr. Victorino Fitzgerald recommended BKA. They were not ready to accept this recommendation and returned to the care at the wound center. There are also seen by Dr. Tawanna Fitzgerald early for vascular evaluation. No large vessel disease was found but he was noted to have severe small vessel disease with no surgical options. He underwent a bone biopsy on 01/24/2017. The culture grew only rare diphtheroids. No organisms were seen on Gram stain. The biopsy was not sent for pathologic evaluation. He has not been on any antibiotics since his surgery in April. They tell me that the ulcer on the dorsum of his foot has been healing slowly. His father estimates that it's about half the size it was 2 months ago.  Review of Systems: Review of Systems  Constitutional: Negative for chills, diaphoresis and fever.  Musculoskeletal: Negative for joint pain.       He has some mild itching around the ulcer on his left foot.  Skin: Negative for rash.  Neurological: Positive for sensory change.      Past Medical History:  Diagnosis Date  . Abscess of hand, left 12/08/2012   left hand with only 3rd to 5th finger, 4th finger to midjoint.- reddened but dry and intact scar line.  . Arthritis    ankles & shoulders   . Bacteremia   . Blood poisoning   . Diabetes mellitus without complication (HCC)    not usually checking blood sugars daily.  . Flexor tenosynovitis of finger 12/08/2012  . Heart murmur   . Hypertension   . Stroke [facility])    expressive aphasia, R sided weakness   . Toe osteomyelitis, right (HCC)    right second toe    Social History  Substance Use Topics  . Smoking status: Never Smoker  . Smokeless tobacco: Never Used  . Alcohol use 33.6 oz/week    56 Cans of beer per week     Comment: 8-12 beers daily    No family history on file. Allergies  Allergen Reactions  . Unasyn [Ampicillin-Sulbactam Sodium] Rash    Has patient  had a PCN reaction causing immediate rash, facial/tongue/throat swelling, SOB or lightheadedness with hypotension: No Has patient had a PCN reaction causing severe rash involving mucus membranes or skin necrosis: No Has patient had a PCN reaction that required hospitalization: No Has patient had a PCN reaction occurring within the last 10 years: No If all of the above answers are "NO", then may proceed with Cephalosporin use.     OBJECTIVE: Vitals:   02/20/17 0835  BP: (!) 157/83  Pulse: (!) 108  Temp: 97.7 F (36.5 C)  TempSrc: Oral  Weight: 221 lb (100.2 kg)   Body mass index is 29.97 kg/m.   Physical Exam  Constitutional:  He is in no distress.  Musculoskeletal:  There is a dime-sized ulcer on the dorsum of  his left foot justo his transmetatarsal incision. There is no surrounding cellulitis or fluctuance. There is no exposed bone. The ulcer base is covered with granulation tissue over about two thirds of the surface the rest is covered with yellow exudate.The incision is healing nicely. There had been an open, draining area at the very lateral edge of the incision but a recently closed up and now there is a superficial scab over the area.    Microbiology: No results found for this or any previous visit (from the past 240 hour(s)).  Cliffton AstersJohn Toba Claudio, MD [facility] [phone] pager   [phone] cell 02/20/2017, 11:12 AM

## 2017-02-20 NOTE — Assessment & Plan Note (Signed)
I'm not absolutely convinced that he has postoperative osteomyelitis. The MRI findings may be nonspecific bony changes related to his recent transmetatarsal amputation. The bone culture was fairly unimpressive. He is improving and the ulcer is healing without antibiotic therapy. I favor continued local wound care and observation off of antibiotics for now. He will follow-up with me in 3-4 weeks.

## 2017-02-21 DIAGNOSIS — L97522 Non-pressure chronic ulcer of other part of left foot with fat layer exposed: Secondary | ICD-10-CM | POA: Diagnosis not present

## 2017-02-21 DIAGNOSIS — E11621 Type 2 diabetes mellitus with foot ulcer: Secondary | ICD-10-CM | POA: Diagnosis not present

## 2017-02-22 ENCOUNTER — Other Ambulatory Visit: Payer: Self-pay | Admitting: Family Medicine

## 2017-02-22 MED ORDER — METFORMIN HCL ER 500 MG PO TB24: ORAL_TABLET | ORAL | 3 refills | Status: DC

## 2017-02-22 MED ORDER — ATORVASTATIN CALCIUM 40 MG PO TABS: ORAL_TABLET | ORAL | 3 refills | Status: DC

## 2017-02-22 NOTE — Telephone Encounter (Signed)
Father of patient is calling to request refill of:  Name of Medication(s):  Metformin and Lipitor   Last date of OV: 01-02-17 Pharmacy:  [facility]  Will route refill to provider.  Jazmin Hartsell,CMA

## 2017-02-22 NOTE — Telephone Encounter (Signed)
Father is calling for his son. He needs a refill on his Lipitor and Metformin. The metformin directions should say 2 in the morning with a meal and 2 in the evening with a meal. jw

## 2017-02-26 DIAGNOSIS — E1151 Type 2 diabetes mellitus with diabetic peripheral angiopathy without gangrene: Secondary | ICD-10-CM | POA: Diagnosis not present

## 2017-02-26 DIAGNOSIS — Z4781 Encounter for orthopedic aftercare following surgical amputation: Secondary | ICD-10-CM | POA: Diagnosis not present

## 2017-02-28 DIAGNOSIS — L97522 Non-pressure chronic ulcer of other part of left foot with fat layer exposed: Secondary | ICD-10-CM | POA: Diagnosis not present

## 2017-02-28 DIAGNOSIS — E11621 Type 2 diabetes mellitus with foot ulcer: Secondary | ICD-10-CM | POA: Diagnosis not present

## 2017-03-05 DIAGNOSIS — Z4781 Encounter for orthopedic aftercare following surgical amputation: Secondary | ICD-10-CM | POA: Diagnosis not present

## 2017-03-05 DIAGNOSIS — E1151 Type 2 diabetes mellitus with diabetic peripheral angiopathy without gangrene: Secondary | ICD-10-CM | POA: Diagnosis not present

## 2017-03-07 DIAGNOSIS — L97522 Non-pressure chronic ulcer of other part of left foot with fat layer exposed: Secondary | ICD-10-CM | POA: Diagnosis not present

## 2017-03-07 DIAGNOSIS — E11621 Type 2 diabetes mellitus with foot ulcer: Secondary | ICD-10-CM | POA: Diagnosis not present

## 2017-03-12 DIAGNOSIS — E1151 Type 2 diabetes mellitus with diabetic peripheral angiopathy without gangrene: Secondary | ICD-10-CM | POA: Diagnosis not present

## 2017-03-12 DIAGNOSIS — Z4781 Encounter for orthopedic aftercare following surgical amputation: Secondary | ICD-10-CM | POA: Diagnosis not present

## 2017-03-14 DIAGNOSIS — L97522 Non-pressure chronic ulcer of other part of left foot with fat layer exposed: Secondary | ICD-10-CM | POA: Diagnosis not present

## 2017-03-14 DIAGNOSIS — E11621 Type 2 diabetes mellitus with foot ulcer: Secondary | ICD-10-CM | POA: Diagnosis not present

## 2017-03-19 DIAGNOSIS — Z4781 Encounter for orthopedic aftercare following surgical amputation: Secondary | ICD-10-CM | POA: Diagnosis not present

## 2017-03-19 DIAGNOSIS — E1151 Type 2 diabetes mellitus with diabetic peripheral angiopathy without gangrene: Secondary | ICD-10-CM | POA: Diagnosis not present

## 2017-03-20 ENCOUNTER — Telehealth (INDEPENDENT_AMBULATORY_CARE_PROVIDER_SITE_OTHER): Payer: Self-pay | Admitting: Orthopedic Surgery

## 2017-03-20 NOTE — Telephone Encounter (Signed)
[facility] asking for the Home Health Certification order to be re sent, they only received one page. CB # [id number]  Fax # [phone]

## 2017-03-21 ENCOUNTER — Encounter (HOSPITAL_BASED_OUTPATIENT_CLINIC_OR_DEPARTMENT_OTHER): Payer: Medicare Other | Attending: Surgery

## 2017-03-21 DIAGNOSIS — L97521 Non-pressure chronic ulcer of other part of left foot limited to breakdown of skin: Secondary | ICD-10-CM | POA: Diagnosis not present

## 2017-03-21 DIAGNOSIS — Z89022 Acquired absence of left finger(s): Secondary | ICD-10-CM | POA: Insufficient documentation

## 2017-03-21 DIAGNOSIS — Z89432 Acquired absence of left foot: Secondary | ICD-10-CM | POA: Diagnosis not present

## 2017-03-21 DIAGNOSIS — E11621 Type 2 diabetes mellitus with foot ulcer: Secondary | ICD-10-CM | POA: Insufficient documentation

## 2017-03-21 DIAGNOSIS — Z89431 Acquired absence of right foot: Secondary | ICD-10-CM | POA: Diagnosis not present

## 2017-03-21 DIAGNOSIS — I10 Essential (primary) hypertension: Secondary | ICD-10-CM | POA: Insufficient documentation

## 2017-03-21 DIAGNOSIS — L97522 Non-pressure chronic ulcer of other part of left foot with fat layer exposed: Secondary | ICD-10-CM | POA: Diagnosis not present

## 2017-03-21 NOTE — Telephone Encounter (Signed)
Tried calling the phone number but it has been disconnected. Patient has not been seen in our office since the beginning of June. I do not see home health order, and patients family requested referral to Wound Center. There is nothing to fax.

## 2017-03-26 DIAGNOSIS — E1151 Type 2 diabetes mellitus with diabetic peripheral angiopathy without gangrene: Secondary | ICD-10-CM | POA: Diagnosis not present

## 2017-03-26 DIAGNOSIS — Z4781 Encounter for orthopedic aftercare following surgical amputation: Secondary | ICD-10-CM | POA: Diagnosis not present

## 2017-03-27 ENCOUNTER — Encounter: Payer: Self-pay | Admitting: Internal Medicine

## 2017-03-27 ENCOUNTER — Ambulatory Visit (INDEPENDENT_AMBULATORY_CARE_PROVIDER_SITE_OTHER): Payer: Medicare Other | Admitting: Internal Medicine

## 2017-03-27 DIAGNOSIS — L97421 Non-pressure chronic ulcer of left heel and midfoot limited to breakdown of skin: Secondary | ICD-10-CM

## 2017-03-27 DIAGNOSIS — E11621 Type 2 diabetes mellitus with foot ulcer: Secondary | ICD-10-CM | POA: Diagnosis present

## 2017-03-27 NOTE — Assessment & Plan Note (Signed)
He continues to have difficulty with wound healing following transmetatarsal amputation of his left foot but I do not see any evidence of active infection. I do not feel that he needs to restart antibiotic therapy. He will continue his current wound care regimen and follow-up here in 6 weeks.

## 2017-03-27 NOTE — Progress Notes (Signed)
[facility]  Patient Active Problem List   Diagnosis Date Noted  . Diabetic ulcer of left midfoot associated with type 2 diabetes mellitus, limited to breakdown of skin (HCC) 11/27/2016    Priority: High  . S/P transmetatarsal amputation of foot, left (HCC) 10/20/2016  . Healthcare maintenance 04/27/2015  . Foot swelling 12/16/2014  . Amputation of left hand ring finger 09/23/2014  . History of osteomyelitis 08/26/2014  . Serum albumin decreased 08/26/2014  . Vitamin B12 deficiency 08/18/2014  . Heart murmur, systolic   . Heart murmur 07/24/2013  . Hyperlipidemia 03/06/2013  . Hypertension 03/06/2013  . Tachycardia 01/13/2013  . Diabetes mellitus type 2 with complications, uncontrolled (HCC) 12/08/2012  . Expressive aphasia syndrome 12/08/2012  . 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   ASPIRIN-SOD BICARB-CITRIC ACID (ALKA-SELTZER) 325 MG TBEF TABLET    Take 325 mg by mouth every 6 (six) hours as needed.   ATORVASTATIN (LIPITOR) 40 MG TABLET    TAKE 1 TABLET BY MOUTH DAILY   CYANOCOBALAMIN (B-12) 2000 MCG TABS    Take 2,000 mcg by mouth daily.    GLIPIZIDE (GLUCOTROL) 5 MG TABLET    TAKE 1 TABLET BY MOUTH TWICE DAILY BEFORE MEALS   HYDROCODONE-ACETAMINOPHEN (NORCO) 5-325 MG TABLET    Take 1 tablet by mouth every 6 (six) hours as needed.   METFORMIN (GLUCOPHAGE-XR) 500 MG 24 HR TABLET    TAKE 2 TABLETS BY MOUTH TWICE A DAY WITH MEALS.   METOPROLOL SUCCINATE (TOPROL-XL) 50 MG 24 HR TABLET    Take 1 tablet (50 mg total) by mouth daily. Take with or immediately following a meal.   MULTIPLE VITAMINS-MINERALS (CENTRUM SILVER 50+MEN PO)    Take by mouth.   NAPHAZOLINE-GLYCERIN (CLEAR EYES MAX REDNESS RELIEF OP)    Apply 2 drops to eye daily as needed (dry eyes).    NAPROXEN  SODIUM (ANAPROX) 220 MG TABLET    Take 220 mg by mouth 2 (two) times daily as needed (pain).   SILVER SULFADIAZINE (SILVADENE) 1 % CREAM    Apply 1 application topically daily.  Modified Medications   No medications on file  Discontinued Medications   HYDROCODONE-ACETAMINOPHEN (NORCO) 5-325 MG TABLET    Take 1-2 tablets by mouth every 6 (six) hours as needed for moderate pain.    Subjective: Tinnie GensJeffrey is in for his routine follow-up visit. He continues his slow recovery from left transmetatarsal amputation earlier this year. He has a home nurse who comes out once a week and he also goes to the wound center to see Dr. Lewie ChamberBruno every Wednesday. The ulcer over the dorsum of his left foot stump is healing nicely. He tells me that the scabbed area over the lateral portion of the incision opened up after his initial visit with me. He has not had any significant drainage from the area. He says his home nurse tells him that that wound is not as deep as it used to be.  Review of Systems: Review of Systems  Constitutional: Negative for chills, diaphoresis and fever.  Gastrointestinal: Negative for abdominal pain, diarrhea, nausea and vomiting.  Musculoskeletal:       He has a little burning and tingling over the lateral portion of his left foot but  no pain.    Past Medical History:  Diagnosis Date  . Abscess of hand, left 12/08/2012   left hand with only 3rd to 5th finger, 4th finger to midjoint.- reddened but dry and intact scar line.  . Arthritis    ankles & shoulders   . Bacteremia   . Blood poisoning   . Diabetes mellitus without complication (HCC)    not usually checking blood sugars daily.  . Flexor tenosynovitis of finger 12/08/2012  . Heart murmur   . Hypertension   . Stroke [facility])    expressive aphasia, R sided weakness   . Toe osteomyelitis, right (HCC)    right second toe    Social History  Substance Use Topics  . Smoking status: Never Smoker  . Smokeless tobacco: Never Used  .  Alcohol use 33.6 oz/week    56 Cans of beer per week     Comment: 8-12 beers daily    No family history on file.  Allergies  Allergen Reactions  . Unasyn [Ampicillin-Sulbactam Sodium] Rash    Has patient had a PCN reaction causing immediate rash, facial/tongue/throat swelling, SOB or lightheadedness with hypotension: No Has patient had a PCN reaction causing severe rash involving mucus membranes or skin necrosis: No Has patient had a PCN reaction that required hospitalization: No Has patient had a PCN reaction occurring within the last 10 years: No If all of the above answers are "NO", then may proceed with Cephalosporin use.     Objective: Vitals:   03/27/17 1106  BP: (!) 192/78  Pulse: (!) 112  Temp: 98.8 F (37.1 C)  TempSrc: Oral  Weight: 224 lb (101.6 kg)   Body mass index is 30.38 kg/m.  Physical Exam  Constitutional:  He is accompanied by his father. He is in good spirits.  Musculoskeletal:  The ulcer over the dorsum of his left foot is looking much better. It is beginning to epithelialize. There is a small open wound at the very lateral edge of his incision. It is about the size of a pencil eraser. There is no drainage or odor. There is no surrounding cellulitis or fluctuance. There is no exposed bone.    Lab Results    Problem List Items Addressed This Visit      High   Diabetic ulcer of left midfoot associated with type 2 diabetes mellitus, limited to breakdown of skin (HCC)    He continues to have difficulty with wound healing following transmetatarsal amputation of his left foot but I do not see any evidence of active infection. I do not feel that he needs to restart antibiotic therapy. He will continue his current wound care regimen and follow-up here in 6 weeks.          Cliffton Asters, MD [facility] [phone] pager   [phone] cell 03/27/2017, 11:42 AM

## 2017-03-28 DIAGNOSIS — Z89022 Acquired absence of left finger(s): Secondary | ICD-10-CM | POA: Diagnosis not present

## 2017-03-28 DIAGNOSIS — I10 Essential (primary) hypertension: Secondary | ICD-10-CM | POA: Diagnosis not present

## 2017-03-28 DIAGNOSIS — L97522 Non-pressure chronic ulcer of other part of left foot with fat layer exposed: Secondary | ICD-10-CM | POA: Diagnosis not present

## 2017-03-28 DIAGNOSIS — Z89432 Acquired absence of left foot: Secondary | ICD-10-CM | POA: Diagnosis not present

## 2017-03-28 DIAGNOSIS — Z89431 Acquired absence of right foot: Secondary | ICD-10-CM | POA: Diagnosis not present

## 2017-03-28 DIAGNOSIS — E11621 Type 2 diabetes mellitus with foot ulcer: Secondary | ICD-10-CM | POA: Diagnosis not present

## 2017-03-28 DIAGNOSIS — L97521 Non-pressure chronic ulcer of other part of left foot limited to breakdown of skin: Secondary | ICD-10-CM | POA: Diagnosis not present

## 2017-04-02 DIAGNOSIS — E1151 Type 2 diabetes mellitus with diabetic peripheral angiopathy without gangrene: Secondary | ICD-10-CM | POA: Diagnosis not present

## 2017-04-02 DIAGNOSIS — Z4781 Encounter for orthopedic aftercare following surgical amputation: Secondary | ICD-10-CM | POA: Diagnosis not present

## 2017-04-04 DIAGNOSIS — Z89022 Acquired absence of left finger(s): Secondary | ICD-10-CM | POA: Diagnosis not present

## 2017-04-04 DIAGNOSIS — I10 Essential (primary) hypertension: Secondary | ICD-10-CM | POA: Diagnosis not present

## 2017-04-04 DIAGNOSIS — L97522 Non-pressure chronic ulcer of other part of left foot with fat layer exposed: Secondary | ICD-10-CM | POA: Diagnosis not present

## 2017-04-04 DIAGNOSIS — L97521 Non-pressure chronic ulcer of other part of left foot limited to breakdown of skin: Secondary | ICD-10-CM | POA: Diagnosis not present

## 2017-04-04 DIAGNOSIS — Z89431 Acquired absence of right foot: Secondary | ICD-10-CM | POA: Diagnosis not present

## 2017-04-04 DIAGNOSIS — Z89432 Acquired absence of left foot: Secondary | ICD-10-CM | POA: Diagnosis not present

## 2017-04-04 DIAGNOSIS — E11621 Type 2 diabetes mellitus with foot ulcer: Secondary | ICD-10-CM | POA: Diagnosis not present

## 2017-04-09 DIAGNOSIS — Z4781 Encounter for orthopedic aftercare following surgical amputation: Secondary | ICD-10-CM | POA: Diagnosis not present

## 2017-04-09 DIAGNOSIS — E1151 Type 2 diabetes mellitus with diabetic peripheral angiopathy without gangrene: Secondary | ICD-10-CM | POA: Diagnosis not present

## 2017-04-11 DIAGNOSIS — E11621 Type 2 diabetes mellitus with foot ulcer: Secondary | ICD-10-CM | POA: Diagnosis not present

## 2017-04-11 DIAGNOSIS — Z89431 Acquired absence of right foot: Secondary | ICD-10-CM | POA: Diagnosis not present

## 2017-04-11 DIAGNOSIS — I10 Essential (primary) hypertension: Secondary | ICD-10-CM | POA: Diagnosis not present

## 2017-04-11 DIAGNOSIS — L97521 Non-pressure chronic ulcer of other part of left foot limited to breakdown of skin: Secondary | ICD-10-CM | POA: Diagnosis not present

## 2017-04-11 DIAGNOSIS — Z89022 Acquired absence of left finger(s): Secondary | ICD-10-CM | POA: Diagnosis not present

## 2017-04-11 DIAGNOSIS — L97522 Non-pressure chronic ulcer of other part of left foot with fat layer exposed: Secondary | ICD-10-CM | POA: Diagnosis not present

## 2017-04-11 DIAGNOSIS — Z89432 Acquired absence of left foot: Secondary | ICD-10-CM | POA: Diagnosis not present

## 2017-04-16 DIAGNOSIS — E1151 Type 2 diabetes mellitus with diabetic peripheral angiopathy without gangrene: Secondary | ICD-10-CM | POA: Diagnosis not present

## 2017-04-16 DIAGNOSIS — Z4781 Encounter for orthopedic aftercare following surgical amputation: Secondary | ICD-10-CM | POA: Diagnosis not present

## 2017-04-23 DIAGNOSIS — Z4781 Encounter for orthopedic aftercare following surgical amputation: Secondary | ICD-10-CM | POA: Diagnosis not present

## 2017-04-23 DIAGNOSIS — E1151 Type 2 diabetes mellitus with diabetic peripheral angiopathy without gangrene: Secondary | ICD-10-CM | POA: Diagnosis not present

## 2017-04-25 ENCOUNTER — Encounter (HOSPITAL_BASED_OUTPATIENT_CLINIC_OR_DEPARTMENT_OTHER): Payer: Medicare Other | Attending: Surgery

## 2017-04-25 DIAGNOSIS — E11621 Type 2 diabetes mellitus with foot ulcer: Secondary | ICD-10-CM | POA: Insufficient documentation

## 2017-04-25 DIAGNOSIS — Z89022 Acquired absence of left finger(s): Secondary | ICD-10-CM | POA: Insufficient documentation

## 2017-04-25 DIAGNOSIS — Z89431 Acquired absence of right foot: Secondary | ICD-10-CM | POA: Insufficient documentation

## 2017-04-25 DIAGNOSIS — I10 Essential (primary) hypertension: Secondary | ICD-10-CM | POA: Diagnosis not present

## 2017-04-25 DIAGNOSIS — L97522 Non-pressure chronic ulcer of other part of left foot with fat layer exposed: Secondary | ICD-10-CM | POA: Insufficient documentation

## 2017-04-25 DIAGNOSIS — Z89432 Acquired absence of left foot: Secondary | ICD-10-CM | POA: Insufficient documentation

## 2017-04-30 DIAGNOSIS — E1151 Type 2 diabetes mellitus with diabetic peripheral angiopathy without gangrene: Secondary | ICD-10-CM | POA: Diagnosis not present

## 2017-04-30 DIAGNOSIS — Z4781 Encounter for orthopedic aftercare following surgical amputation: Secondary | ICD-10-CM | POA: Diagnosis not present

## 2017-05-02 DIAGNOSIS — Z89022 Acquired absence of left finger(s): Secondary | ICD-10-CM | POA: Diagnosis not present

## 2017-05-02 DIAGNOSIS — Z89431 Acquired absence of right foot: Secondary | ICD-10-CM | POA: Diagnosis not present

## 2017-05-02 DIAGNOSIS — E11621 Type 2 diabetes mellitus with foot ulcer: Secondary | ICD-10-CM | POA: Diagnosis not present

## 2017-05-02 DIAGNOSIS — L97522 Non-pressure chronic ulcer of other part of left foot with fat layer exposed: Secondary | ICD-10-CM | POA: Diagnosis not present

## 2017-05-02 DIAGNOSIS — Z89432 Acquired absence of left foot: Secondary | ICD-10-CM | POA: Diagnosis not present

## 2017-05-02 DIAGNOSIS — I10 Essential (primary) hypertension: Secondary | ICD-10-CM | POA: Diagnosis not present

## 2017-05-03 DIAGNOSIS — E113291 Type 2 diabetes mellitus with mild nonproliferative diabetic retinopathy without macular edema, right eye: Secondary | ICD-10-CM | POA: Diagnosis not present

## 2017-05-03 DIAGNOSIS — Z961 Presence of intraocular lens: Secondary | ICD-10-CM | POA: Diagnosis not present

## 2017-05-03 DIAGNOSIS — H5203 Hypermetropia, bilateral: Secondary | ICD-10-CM | POA: Diagnosis not present

## 2017-05-07 DIAGNOSIS — Z4781 Encounter for orthopedic aftercare following surgical amputation: Secondary | ICD-10-CM | POA: Diagnosis not present

## 2017-05-07 DIAGNOSIS — E1151 Type 2 diabetes mellitus with diabetic peripheral angiopathy without gangrene: Secondary | ICD-10-CM | POA: Diagnosis not present

## 2017-05-08 ENCOUNTER — Ambulatory Visit (INDEPENDENT_AMBULATORY_CARE_PROVIDER_SITE_OTHER): Payer: Medicare Other | Admitting: Internal Medicine

## 2017-05-08 ENCOUNTER — Encounter: Payer: Self-pay | Admitting: Internal Medicine

## 2017-05-08 DIAGNOSIS — E11621 Type 2 diabetes mellitus with foot ulcer: Secondary | ICD-10-CM

## 2017-05-08 DIAGNOSIS — L97421 Non-pressure chronic ulcer of left heel and midfoot limited to breakdown of skin: Secondary | ICD-10-CM

## 2017-05-08 NOTE — Progress Notes (Signed)
[facility]  Patient Active Problem List   Diagnosis Date Noted  . Diabetic ulcer of left midfoot associated with type 2 diabetes mellitus, limited to breakdown of skin (HCC) 11/27/2016    Priority: High  . S/P transmetatarsal amputation of foot, left (HCC) 10/20/2016  . Healthcare maintenance 04/27/2015  . Foot swelling 12/16/2014  . Amputation of left hand ring finger 09/23/2014  . History of osteomyelitis 08/26/2014  . Serum albumin decreased 08/26/2014  . Vitamin B12 deficiency 08/18/2014  . Heart murmur, systolic   . Heart murmur 07/24/2013  . Hyperlipidemia 03/06/2013  . Hypertension 03/06/2013  . Tachycardia 01/13/2013  . Diabetes mellitus type 2 with complications, uncontrolled (HCC) 12/08/2012  . Expressive aphasia syndrome 12/08/2012  . 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   ASPIRIN-SOD BICARB-CITRIC ACID (ALKA-SELTZER) 325 MG TBEF TABLET    Take 325 mg by mouth every 6 (six) hours as needed.   ATORVASTATIN (LIPITOR) 40 MG TABLET    TAKE 1 TABLET BY MOUTH DAILY   CYANOCOBALAMIN (B-12) 2000 MCG TABS    Take 2,000 mcg by mouth daily.    GLIPIZIDE (GLUCOTROL) 5 MG TABLET    TAKE 1 TABLET BY MOUTH TWICE DAILY BEFORE MEALS   HYDROCODONE-ACETAMINOPHEN (NORCO) 5-325 MG TABLET    Take 1 tablet by mouth every 6 (six) hours as needed.   METFORMIN (GLUCOPHAGE-XR) 500 MG 24 HR TABLET    TAKE 2 TABLETS BY MOUTH TWICE A DAY WITH MEALS.   METOPROLOL SUCCINATE (TOPROL-XL) 50 MG 24 HR TABLET    Take 1 tablet (50 mg total) by mouth daily. Take with or immediately following a meal.   MULTIPLE VITAMINS-MINERALS (CENTRUM SILVER 50+MEN PO)    Take by mouth.   NAPHAZOLINE-GLYCERIN (CLEAR EYES MAX REDNESS RELIEF OP)    Apply 2 drops to eye daily as needed (dry eyes).    NAPROXEN  SODIUM (ANAPROX) 220 MG TABLET    Take 220 mg by mouth 2 (two) times daily as needed (pain).   SILVER SULFADIAZINE (SILVADENE) 1 % CREAM    Apply 1 application topically daily.  Modified Medications   No medications on file  Discontinued Medications   No medications on file    Subjective:  Stephen Fitzgerald is in for his routine follow-up visit with his father. They tell me that Dr. Meyer Russel put him on 2 weeks of oral  Doxycycline recently as a precaution. They did not feel like there was any infection.  Both wounds on his left foot are now closed.  Review of Systems: Review of Systems  Constitutional: Negative for chills, diaphoresis and fever.  Gastrointestinal: Negative for abdominal pain, diarrhea, nausea and vomiting.  Musculoskeletal: Negative for joint pain.    Past Medical History:  Diagnosis Date  . Abscess of hand, left 12/08/2012   left hand with only 3rd to 5th finger, 4th finger to midjoint.- reddened but dry and intact scar line.  . Arthritis    ankles & shoulders   . Bacteremia   . Blood poisoning   . Diabetes mellitus without complication (HCC)    not usually checking blood sugars daily.  . Flexor tenosynovitis of finger 12/08/2012  . Heart murmur   . Hypertension   . Stroke [facility])  expressive aphasia, R sided weakness   . Toe osteomyelitis, right (HCC)    right second toe    Social History  Substance Use Topics  . Smoking status: Never Smoker  . Smokeless tobacco: Never Used  . Alcohol use 33.6 oz/week    56 Cans of beer per week     Comment: 8-12 beers daily    No family history on file.  Allergies  Allergen Reactions  . Unasyn [Ampicillin-Sulbactam Sodium] Rash    Has patient had a PCN reaction causing immediate rash, facial/tongue/throat swelling, SOB or lightheadedness with hypotension: No Has patient had a PCN reaction causing severe rash involving mucus membranes or skin necrosis: No Has patient had a PCN reaction that required hospitalization:  No Has patient had a PCN reaction occurring within the last 10 years: No If all of the above answers are "NO", then may proceed with Cephalosporin use.     Objective: Vitals:   05/08/17 0852  BP: (!) 159/90  Pulse: (!) 111  Temp: 98.9 F (37.2 C)  TempSrc: Oral  Weight: 228 lb (103.4 kg)  Height: 6\' 1"  (1.854 m)   Body mass index is 30.08 kg/m.  Physical Exam  Constitutional: He is oriented to person, place, and time.  He is in good spirits.  Musculoskeletal:  He has had a surgical transmetatarsal amputation of his left foot. The wound on the dorsum of his stump is now closed with a small superficial scab. There is no surrounding cellulitis or fluctuance. The wound on the lateral edge of the stump is also closed without surrounding cellulitis.  Neurological: He is alert and oriented to person, place, and time.  Psychiatric: Mood and affect normal.    Lab Results    Problem List Items Addressed This Visit      High   Diabetic ulcer of left midfoot associated with type 2 diabetes mellitus, limited to breakdown of skin (HCC)     His wounds are now closed and there is no evidence of any active infection. He can follow-up here as needed.          Cliffton AstersJohn Rosine Solecki, MD [facility] [phone] pager   [phone] cell 05/08/2017, 9:13 AM

## 2017-05-08 NOTE — Assessment & Plan Note (Signed)
His wounds are now closed and there is no evidence of any active infection. He can follow-up here as needed.

## 2017-05-09 DIAGNOSIS — I739 Peripheral vascular disease, unspecified: Secondary | ICD-10-CM | POA: Diagnosis not present

## 2017-05-09 DIAGNOSIS — L97522 Non-pressure chronic ulcer of other part of left foot with fat layer exposed: Secondary | ICD-10-CM | POA: Diagnosis not present

## 2017-05-09 DIAGNOSIS — E11621 Type 2 diabetes mellitus with foot ulcer: Secondary | ICD-10-CM | POA: Diagnosis not present

## 2017-05-09 DIAGNOSIS — Z89022 Acquired absence of left finger(s): Secondary | ICD-10-CM | POA: Diagnosis not present

## 2017-05-09 DIAGNOSIS — Z89431 Acquired absence of right foot: Secondary | ICD-10-CM | POA: Diagnosis not present

## 2017-05-09 DIAGNOSIS — I10 Essential (primary) hypertension: Secondary | ICD-10-CM | POA: Diagnosis not present

## 2017-05-09 DIAGNOSIS — Z89432 Acquired absence of left foot: Secondary | ICD-10-CM | POA: Diagnosis not present

## 2017-05-14 DIAGNOSIS — E1151 Type 2 diabetes mellitus with diabetic peripheral angiopathy without gangrene: Secondary | ICD-10-CM | POA: Diagnosis not present

## 2017-05-14 DIAGNOSIS — Z4781 Encounter for orthopedic aftercare following surgical amputation: Secondary | ICD-10-CM | POA: Diagnosis not present

## 2017-05-16 DIAGNOSIS — Z89022 Acquired absence of left finger(s): Secondary | ICD-10-CM | POA: Diagnosis not present

## 2017-05-16 DIAGNOSIS — I10 Essential (primary) hypertension: Secondary | ICD-10-CM | POA: Diagnosis not present

## 2017-05-16 DIAGNOSIS — Z89431 Acquired absence of right foot: Secondary | ICD-10-CM | POA: Diagnosis not present

## 2017-05-16 DIAGNOSIS — Z89432 Acquired absence of left foot: Secondary | ICD-10-CM | POA: Diagnosis not present

## 2017-05-16 DIAGNOSIS — E11621 Type 2 diabetes mellitus with foot ulcer: Secondary | ICD-10-CM | POA: Diagnosis not present

## 2017-05-16 DIAGNOSIS — L97522 Non-pressure chronic ulcer of other part of left foot with fat layer exposed: Secondary | ICD-10-CM | POA: Diagnosis not present

## 2017-05-16 DIAGNOSIS — L97529 Non-pressure chronic ulcer of other part of left foot with unspecified severity: Secondary | ICD-10-CM | POA: Diagnosis not present

## 2017-05-23 ENCOUNTER — Encounter (HOSPITAL_BASED_OUTPATIENT_CLINIC_OR_DEPARTMENT_OTHER): Payer: Medicare Other | Attending: Surgery

## 2017-05-23 DIAGNOSIS — I10 Essential (primary) hypertension: Secondary | ICD-10-CM | POA: Diagnosis not present

## 2017-05-23 DIAGNOSIS — Z89421 Acquired absence of other right toe(s): Secondary | ICD-10-CM | POA: Insufficient documentation

## 2017-05-23 DIAGNOSIS — Z89022 Acquired absence of left finger(s): Secondary | ICD-10-CM | POA: Diagnosis not present

## 2017-05-23 DIAGNOSIS — I70244 Atherosclerosis of native arteries of left leg with ulceration of heel and midfoot: Secondary | ICD-10-CM | POA: Diagnosis not present

## 2017-05-23 DIAGNOSIS — Z89432 Acquired absence of left foot: Secondary | ICD-10-CM | POA: Insufficient documentation

## 2017-05-23 DIAGNOSIS — E11621 Type 2 diabetes mellitus with foot ulcer: Secondary | ICD-10-CM | POA: Insufficient documentation

## 2017-05-23 DIAGNOSIS — Z89431 Acquired absence of right foot: Secondary | ICD-10-CM | POA: Diagnosis not present

## 2017-05-23 DIAGNOSIS — L97522 Non-pressure chronic ulcer of other part of left foot with fat layer exposed: Secondary | ICD-10-CM | POA: Diagnosis not present

## 2017-05-30 DIAGNOSIS — I70244 Atherosclerosis of native arteries of left leg with ulceration of heel and midfoot: Secondary | ICD-10-CM | POA: Diagnosis not present

## 2017-05-30 DIAGNOSIS — E11621 Type 2 diabetes mellitus with foot ulcer: Secondary | ICD-10-CM | POA: Diagnosis not present

## 2017-05-30 DIAGNOSIS — Z89421 Acquired absence of other right toe(s): Secondary | ICD-10-CM | POA: Diagnosis not present

## 2017-05-30 DIAGNOSIS — Z89432 Acquired absence of left foot: Secondary | ICD-10-CM | POA: Diagnosis not present

## 2017-05-30 DIAGNOSIS — L97522 Non-pressure chronic ulcer of other part of left foot with fat layer exposed: Secondary | ICD-10-CM | POA: Diagnosis not present

## 2017-05-30 DIAGNOSIS — I10 Essential (primary) hypertension: Secondary | ICD-10-CM | POA: Diagnosis not present

## 2017-06-05 DIAGNOSIS — Z89432 Acquired absence of left foot: Secondary | ICD-10-CM | POA: Diagnosis not present

## 2017-06-05 DIAGNOSIS — E11621 Type 2 diabetes mellitus with foot ulcer: Secondary | ICD-10-CM | POA: Diagnosis not present

## 2017-06-05 DIAGNOSIS — I70244 Atherosclerosis of native arteries of left leg with ulceration of heel and midfoot: Secondary | ICD-10-CM | POA: Diagnosis not present

## 2017-06-05 DIAGNOSIS — Z89421 Acquired absence of other right toe(s): Secondary | ICD-10-CM | POA: Diagnosis not present

## 2017-06-05 DIAGNOSIS — L97522 Non-pressure chronic ulcer of other part of left foot with fat layer exposed: Secondary | ICD-10-CM | POA: Diagnosis not present

## 2017-06-05 DIAGNOSIS — I10 Essential (primary) hypertension: Secondary | ICD-10-CM | POA: Diagnosis not present

## 2017-06-13 DIAGNOSIS — I10 Essential (primary) hypertension: Secondary | ICD-10-CM | POA: Diagnosis not present

## 2017-06-13 DIAGNOSIS — L97529 Non-pressure chronic ulcer of other part of left foot with unspecified severity: Secondary | ICD-10-CM | POA: Diagnosis not present

## 2017-06-13 DIAGNOSIS — Z89432 Acquired absence of left foot: Secondary | ICD-10-CM | POA: Diagnosis not present

## 2017-06-13 DIAGNOSIS — Z89421 Acquired absence of other right toe(s): Secondary | ICD-10-CM | POA: Diagnosis not present

## 2017-06-13 DIAGNOSIS — L97522 Non-pressure chronic ulcer of other part of left foot with fat layer exposed: Secondary | ICD-10-CM | POA: Diagnosis not present

## 2017-06-13 DIAGNOSIS — I70244 Atherosclerosis of native arteries of left leg with ulceration of heel and midfoot: Secondary | ICD-10-CM | POA: Diagnosis not present

## 2017-06-13 DIAGNOSIS — E11621 Type 2 diabetes mellitus with foot ulcer: Secondary | ICD-10-CM | POA: Diagnosis not present

## 2017-06-30 ENCOUNTER — Encounter (HOSPITAL_COMMUNITY): Payer: Self-pay | Admitting: Emergency Medicine

## 2017-06-30 ENCOUNTER — Ambulatory Visit (HOSPITAL_COMMUNITY)
Admission: EM | Admit: 2017-06-30 | Discharge: 2017-06-30 | Disposition: A | Discharge status: Home / Self Care | Payer: Medicare Other | Attending: Family Medicine | Admitting: Family Medicine

## 2017-06-30 DIAGNOSIS — L309 Dermatitis, unspecified: Secondary | ICD-10-CM | POA: Diagnosis not present

## 2017-06-30 MED ORDER — CLOTRIMAZOLE-BETAMETHASONE 1-0.05 % EX CREA: TOPICAL_CREAM | CUTANEOUS | 0 refills | Status: DC

## 2017-06-30 NOTE — ED Triage Notes (Signed)
Pt here with rash to right lower leg that is itching

## 2017-06-30 NOTE — ED Provider Notes (Signed)
[facility]   [id number] 06/30/17 Arrival Time: 1210  ASSESSMENT & PLAN:  1. Dermatitis     Meds ordered this encounter  Medications  . clotrimazole-betamethasone (LOTRISONE) cream    Sig: Apply to affected area 2 times daily for up to one week.    Dispense:  45 g    Refill:  0   Keep skin moist. F/U with PCP or here if not showing improvement over the next week.  Reviewed expectations re: course of current medical issues. Questions answered. Outlined signs and symptoms indicating need for more acute intervention. Patient verbalized understanding. After Visit Summary given.   SUBJECTIVE:  Stephen SprinklesJeffrey W Fitzgerald is a 56 y.o. male who presents with complaint of:  Rash: Patient complains of rash involving the R leg behind knee and R ankle. Onset gradual, 5-6 days ago. Patient describes the rash as erythematous, crusted. Rash has not changed over time. Reports that rash is pruritic. No pain. Associated symptoms: none. Denies: abdominal pain, arthralgia and fever. Reports that he has had a similar problem in the past; many years ago. He has not had contacts with similar rash. He has not identified precipitant. Environmental exposures or allergies: none Patient's previous dermatologic history includes eczema. Family history of derm problems: none.  No OTC treatment.   ROS: As per HPI.  OBJECTIVE: Vitals:   06/30/17 1225  BP: (!) 151/83  Pulse: (!) 110  Resp: 18  Temp: 98.7 F (37.1 C)  TempSrc: Oral  SpO2: 95%    General appearance: alert; no distress Lungs: clear to auscultation bilaterally Heart: regular rate and rhythm Extremities: no edema Skin: warm and dry; behind R knee mostly with some around R ankle - patch crusty, eczematous and erythematous with what appear to be a few satellite-looking lesions surrounding Psychological: alert and cooperative; normal mood and affect  Allergies  Allergen Reactions  . Unasyn [Ampicillin-Sulbactam Sodium] Rash   Has patient had a PCN reaction causing immediate rash, facial/tongue/throat swelling, SOB or lightheadedness with hypotension: No Has patient had a PCN reaction causing severe rash involving mucus membranes or skin necrosis: No Has patient had a PCN reaction that required hospitalization: No Has patient had a PCN reaction occurring within the last 10 years: No If all of the above answers are "NO", then may proceed with Cephalosporin use.     Past Medical History:  Diagnosis Date  . Abscess of hand, left 12/08/2012   left hand with only 3rd to 5th finger, 4th finger to midjoint.- reddened but dry and intact scar line.  . Arthritis    ankles & shoulders   . Bacteremia   . Blood poisoning   . Diabetes mellitus without complication (HCC)    not usually checking blood sugars daily.  . Flexor tenosynovitis of finger 12/08/2012  . Heart murmur   . Hypertension   . Stroke [facility](HCC)    expressive aphasia, R sided weakness   . Toe osteomyelitis, right (HCC)    right second toe   Social History   Socioeconomic History  . Marital status: Divorced    Spouse name: Not on file  . Number of children: Not on file  . Years of education: Not on file  . Highest education level: Not on file  Social Needs  . Financial resource strain: Not on file  . Food insecurity - worry: Not on file  . Food insecurity - inability: Not on file  . Transportation needs - medical: Not on file  . Transportation needs -  non-medical: Not on file  Occupational History  . Not on file  Tobacco Use  . Smoking status: Never Smoker  . Smokeless tobacco: Never Used  Substance and Sexual Activity  . Alcohol use: Yes    Alcohol/week: 33.6 oz    Types: 56 Cans of beer per week    Comment: 8-12 beers daily  . Drug use: No  . Sexual activity: No  Other Topics Concern  . Not on file  Social History Narrative   Pt lives alone. His father checks  in on him occasionally.   History reviewed. No pertinent family  history. Past Surgical History:  Procedure Laterality Date  . ABDOMINAL AORTAGRAM N/A 08/13/2014   Procedure: ABDOMINAL Ronny FlurryAORTAGRAM;  Surgeon: Fransisco HertzBrian L Chen, MD;  [facility];  Service: Cardiovascular;  Laterality: N/A;  . AMPUTATION Left 12/06/2012   Procedure: AMPUTATION DIGIT LEFT INDEX;  Surgeon: Jodi Marbleavid A Thompson, MD;  [facility];  Service: Orthopedics;  Laterality: Left;  . AMPUTATION Left 08/15/2014   Procedure: AMPUTATION LEFT RING FINGER;  Surgeon: Kathryne Hitchhristopher Y Blackman, MD;  Location: [facility] OR;  Service: Orthopedics;  Laterality: Left;  . AMPUTATION Right 08/15/2014   Procedure: AMPUTATION RIGHT FOOT FIRST RAY AMPUTATION AND IRRIGATION AND DEBREDMENT OF RIGHT FOOT ABSCESS;  Surgeon: Kathryne Hitchhristopher Y Blackman, MD;  Location: [facility] OR;  Service: Orthopedics;  Laterality: Right;  . AMPUTATION Right 12/10/2015   Procedure: AMPUTATION RIGHT 2ND RAY;  Surgeon: Kathryne Hitchhristopher Y Blackman, MD;  Location: [facility] ORS;  Service: Orthopedics;  Laterality: Right;  . AMPUTATION Left 08/24/2016   Procedure: LEFT GREAT TOE AMPUTATION;  Surgeon: Kathryne Hitchhristopher Y Blackman, MD;  Location: [facility] ORS;  Service: Orthopedics;  Laterality: Left;  . AMPUTATION Left 10/20/2016   Procedure: Left Transmetatarsal Amputation, Application of Wound VAC;  Surgeon: Nadara MustardMarcus Duda V, MD;  Location: [facility] OR;  Service: Orthopedics;  Laterality: Left;  . APPLICATION OF WOUND VAC Left 10/20/2016   foot  . CATARACT EXTRACTION W/ INTRAOCULAR LENS  IMPLANT, BILATERAL Bilateral   . I&D EXTREMITY Left 12/04/2012   Procedure: IRRIGATION AND DEBRIDEMENT Left Hand with Ring Removal  times three, Carpal Tunnel , Flexor tendon Synovectomy;  Surgeon: Jodi Marbleavid A Thompson, MD;  Location: [facility] OR;  Service: Orthopedics;  Laterality: Left;  . I&D EXTREMITY Left 12/06/2012   Procedure: IRRIGATION AND DEBRIDEMENT EXTREMITY LEFT HAND;  Surgeon: Jodi Marbleavid A Thompson, MD;  [facility];  Service: Orthopedics;  Laterality: Left;  . I&D EXTREMITY Left 10/03/2016   Procedure:  IRRIGATION AND DEBRIDEMENT LEFT 1ST METATARSAL with Wound Closure;  Surgeon: Kathryne Hitchhristopher Y Blackman, MD;  Location: [facility] OR;  Service: Orthopedics;  Laterality: Left;  . INGUINAL HERNIA REPAIR Bilateral 1964  . TENDON REPAIR Left 12/23/2012   Procedure: DIVISION AND INSETTING FLAP TO LEFT RING FINGER ADJACENT TISSUE REARRANGEMENT LEFT INDEX FINGER AMPUTATION SITE;  Surgeon: Jodi Marbleavid A Thompson, MD;  Location: [facility];  Service: Orthopedics;  Laterality: Left;  . TONSILLECTOMY    . TRANSMETATARSAL AMPUTATION Left 10/20/2016    Transmetatarsal Amputation, Application of Wound VAC     Mardella LaymanHagler, Ebba Goll, MD 06/30/17 1250

## 2017-07-17 NOTE — Progress Notes (Deleted)
   [facility] Phone: [phone]   Date of Visit: 07/18/2017   HPI:  ***  ROS: See HPI.  PMFSH: PMH: HTN DM2 with his of foot ulcer s/p transmetatarsal amputation of left foot HLD Hx of CVA with residual expressive aphasia and cognitive impairment Vitamin B12 Deficiency   PHYSICAL EXAM: There were no vitals taken for this visit. Gen: *** HEENT: *** Heart: *** Lungs: *** Neuro: *** Ext: ***  ASSESSMENT/PLAN:  Health maintenance:  -***  No problem-specific Assessment & Plan notes found for this encounter.  FOLLOW UP: Follow up in *** for ***  Palma HolterKanishka G Gunadasa, MD PGY 3 [facility]

## 2017-07-18 ENCOUNTER — Ambulatory Visit: Payer: Medicare Other | Admitting: Internal Medicine

## 2017-08-08 ENCOUNTER — Encounter: Payer: Self-pay | Admitting: Family Medicine

## 2017-08-08 ENCOUNTER — Other Ambulatory Visit: Payer: Self-pay

## 2017-08-08 ENCOUNTER — Ambulatory Visit (INDEPENDENT_AMBULATORY_CARE_PROVIDER_SITE_OTHER): Payer: Medicare Other | Admitting: Family Medicine

## 2017-08-08 VITALS — BP 140/80 | HR 121 | Temp 98.3°F | Wt 225.8 lb

## 2017-08-08 DIAGNOSIS — E118 Type 2 diabetes mellitus with unspecified complications: Secondary | ICD-10-CM | POA: Diagnosis present

## 2017-08-08 DIAGNOSIS — E1165 Type 2 diabetes mellitus with hyperglycemia: Secondary | ICD-10-CM | POA: Diagnosis not present

## 2017-08-08 DIAGNOSIS — Z89432 Acquired absence of left foot: Secondary | ICD-10-CM | POA: Diagnosis not present

## 2017-08-08 DIAGNOSIS — IMO0002 Reserved for concepts with insufficient information to code with codable children: Secondary | ICD-10-CM

## 2017-08-08 LAB — POCT GLYCOSYLATED HEMOGLOBIN (HGB A1C): Hemoglobin A1C: 8.1

## 2017-08-08 MED ORDER — ATORVASTATIN CALCIUM 40 MG PO TABS: ORAL_TABLET | ORAL | 3 refills | Status: AC

## 2017-08-08 MED ORDER — METOPROLOL SUCCINATE ER 50 MG PO TB24: 50.0000 mg | ORAL_TABLET | Freq: Every day | ORAL | 3 refills | Status: AC

## 2017-08-08 MED ORDER — METFORMIN HCL ER 500 MG PO TB24: ORAL_TABLET | ORAL | 3 refills | Status: AC

## 2017-08-08 MED ORDER — GLIPIZIDE 5 MG PO TABS: 5.0000 mg | ORAL_TABLET | Freq: Two times a day (BID) | ORAL | 3 refills | Status: AC

## 2017-08-08 NOTE — Progress Notes (Signed)
   Subjective:    Patient ID: Stephen Fitzgerald, male    DOB: 26-Feb-1961, 57 y.o.   MRN: [id number]   CC: diabetes check, meet pcp  HPI:  Diabetes:  Last A1c 6.8 on 01/02/2017; 8.1 today- patient reports that he has been less mobile due to his recent surgeries, but that he will soon be able to get around more, hopefully Feb 1. He does not  Wish to change medications, but would prefer to work on his diet. Drinks a beer daily and has been eating more than normal since he is stuck at home.  Taking medications: metformin 1000mg  BID, glipizide 5mg  BID Refuses Aspirin, but is on statin Last foot exam: up to date, and will perform today given recent amputations ROS: denies fever, chills, dizziness, diaphoresis, LOC, polyuria, polydipsia   Smoking status reviewed  Review of Systems  Per HPI, else denies recent illness, fever, headache, changes in vision, chest pain, shortness of breath, abdominal pain, N/V/D, weakness   Patient Active Problem List   Diagnosis Date Noted  . S/P transmetatarsal amputation of foot, left (HCC) 10/20/2016  . Healthcare maintenance 04/27/2015  . History of osteomyelitis 08/26/2014  . Heart murmur, systolic   . Hyperlipidemia 03/06/2013  . Hypertension 03/06/2013  . Diabetes mellitus type 2 with complications, uncontrolled (HCC) 12/08/2012  . Expressive aphasia syndrome 12/08/2012  . Late effects of cerebrovascular accident 12/08/2012     Objective:  BP 140/80 (BP Location: Right Arm, Patient Position: Sitting, Cuff Size: Normal)   Pulse (!) 121   Temp 98.3 F (36.8 C) (Oral)   Wt 102.4 kg (225 lb 12.8 oz)   SpO2 96%   BMI 29.79 kg/m  Vitals and nursing note reviewed  Diabetic Foot Exam - Simple   Simple Foot Form Diabetic Foot exam was performed with the following findings:  Yes 08/08/2017  4:15 PM  Visual Inspection See comments:  Yes Sensation Testing See comments:  Yes Pulse Check Posterior Tibialis and Dorsalis pulse intact bilaterally:   Yes Comments No ulcerations, no other skin breakdown, s/p L transmetatarsal amputation and great and second toe amputation of R foot. Decreased sensation bilaterally.    General: NAD, pleasant Cardiac: RRR, normal heart sounds, 3/6 systolic murmur Respiratory: CTAB, normal effort Abdomen: soft, nontender, nondistended. Bowel sounds present Extremities: no edema or cyanosis. WWP. Multiple amputations on BL feet with no current ulcerations or open sores, one healing scab on the L  Skin: warm and dry, no rashes noted Neuro: alert and oriented, some speech impairment from previous stroke  Assessment & Plan:    Diabetes mellitus type 2 with complications, uncontrolled A1c is 8.1, increased from 6.8 in 12/2016. Patient with recent L foot transmetatarsal amputation has him limited in mobility. Ptyient refusing urine microalbumin testing today. Patient would like to try to change diet and get to moving more. Not open to any change in medication. Will continue to monitor progress. Encouraged patient to return in 3 months for follow up of A1c after diet changes.   S/P transmetatarsal amputation of foot, left (HCC) Patient followed closely by wound care. Will continue to follow along. Patient with no open ulcers currently.    [place] Kelijah Towry, DO Family Medicine Resident PGY-1

## 2017-08-08 NOTE — Patient Instructions (Signed)
Thank you for coming to see me today. It was a pleasure meeting you! Today we talked about:   Your diabetes management. We will continue your current medications now. If you are able, please work on your diet and as you can exercising.   Please follow-up with me in 3 months or sooner if needed.  If you have any questions or concerns, please do not hesitate to call the office at [phone].  Take Care,   [place] Islam Villescas, DO

## 2017-08-09 ENCOUNTER — Encounter: Payer: Self-pay | Admitting: Family Medicine

## 2017-08-09 NOTE — Assessment & Plan Note (Signed)
A1c is 8.1, increased from 6.8 in 12/2016. Patient with recent L foot transmetatarsal amputation has him limited in mobility. Ptyient refusing urine microalbumin testing today. Patient would like to try to change diet and get to moving more. Not open to any change in medication. Will continue to monitor progress. Encouraged patient to return in 3 months for follow up of A1c after diet changes.

## 2017-08-09 NOTE — Assessment & Plan Note (Signed)
Patient followed closely by wound care. Will continue to follow along. Patient with no open ulcers currently.

## 2017-09-06 ENCOUNTER — Encounter (HOSPITAL_COMMUNITY): Payer: Self-pay | Admitting: Emergency Medicine

## 2017-09-06 ENCOUNTER — Inpatient Hospital Stay (HOSPITAL_COMMUNITY)
Admission: EM | Admit: 2017-09-06 | Discharge: 2017-09-14 | DRG: 208 | Disposition: E | Payer: Medicare Other | Attending: Pulmonary Disease | Admitting: Pulmonary Disease

## 2017-09-06 ENCOUNTER — Emergency Department (HOSPITAL_COMMUNITY): Payer: Medicare Other

## 2017-09-06 ENCOUNTER — Other Ambulatory Visit: Payer: Self-pay

## 2017-09-06 DIAGNOSIS — R011 Cardiac murmur, unspecified: Secondary | ICD-10-CM | POA: Diagnosis present

## 2017-09-06 DIAGNOSIS — N179 Acute kidney failure, unspecified: Secondary | ICD-10-CM | POA: Diagnosis present

## 2017-09-06 DIAGNOSIS — I5021 Acute systolic (congestive) heart failure: Secondary | ICD-10-CM | POA: Diagnosis present

## 2017-09-06 DIAGNOSIS — E538 Deficiency of other specified B group vitamins: Secondary | ICD-10-CM | POA: Diagnosis present

## 2017-09-06 DIAGNOSIS — J810 Acute pulmonary edema: Secondary | ICD-10-CM | POA: Diagnosis not present

## 2017-09-06 DIAGNOSIS — Z881 Allergy status to other antibiotic agents status: Secondary | ICD-10-CM

## 2017-09-06 DIAGNOSIS — M19011 Primary osteoarthritis, right shoulder: Secondary | ICD-10-CM | POA: Diagnosis present

## 2017-09-06 DIAGNOSIS — I69320 Aphasia following cerebral infarction: Secondary | ICD-10-CM

## 2017-09-06 DIAGNOSIS — Z9841 Cataract extraction status, right eye: Secondary | ICD-10-CM | POA: Diagnosis not present

## 2017-09-06 DIAGNOSIS — R140 Abdominal distension (gaseous): Secondary | ICD-10-CM | POA: Diagnosis not present

## 2017-09-06 DIAGNOSIS — I69351 Hemiplegia and hemiparesis following cerebral infarction affecting right dominant side: Secondary | ICD-10-CM | POA: Diagnosis not present

## 2017-09-06 DIAGNOSIS — I4901 Ventricular fibrillation: Secondary | ICD-10-CM | POA: Diagnosis not present

## 2017-09-06 DIAGNOSIS — Z961 Presence of intraocular lens: Secondary | ICD-10-CM | POA: Diagnosis present

## 2017-09-06 DIAGNOSIS — I472 Ventricular tachycardia: Secondary | ICD-10-CM | POA: Diagnosis not present

## 2017-09-06 DIAGNOSIS — R0602 Shortness of breath: Secondary | ICD-10-CM | POA: Diagnosis not present

## 2017-09-06 DIAGNOSIS — J1000 Influenza due to other identified influenza virus with unspecified type of pneumonia: Secondary | ICD-10-CM | POA: Diagnosis not present

## 2017-09-06 DIAGNOSIS — J9601 Acute respiratory failure with hypoxia: Secondary | ICD-10-CM

## 2017-09-06 DIAGNOSIS — J181 Lobar pneumonia, unspecified organism: Secondary | ICD-10-CM | POA: Diagnosis not present

## 2017-09-06 DIAGNOSIS — M19042 Primary osteoarthritis, left hand: Secondary | ICD-10-CM | POA: Diagnosis present

## 2017-09-06 DIAGNOSIS — E1165 Type 2 diabetes mellitus with hyperglycemia: Secondary | ICD-10-CM | POA: Diagnosis present

## 2017-09-06 DIAGNOSIS — M19012 Primary osteoarthritis, left shoulder: Secondary | ICD-10-CM | POA: Diagnosis present

## 2017-09-06 DIAGNOSIS — R000 Tachycardia, unspecified: Secondary | ICD-10-CM | POA: Diagnosis not present

## 2017-09-06 DIAGNOSIS — Z7982 Long term (current) use of aspirin: Secondary | ICD-10-CM | POA: Diagnosis not present

## 2017-09-06 DIAGNOSIS — J9602 Acute respiratory failure with hypercapnia: Secondary | ICD-10-CM | POA: Diagnosis not present

## 2017-09-06 DIAGNOSIS — I700 Atherosclerosis of aorta: Secondary | ICD-10-CM | POA: Diagnosis not present

## 2017-09-06 DIAGNOSIS — M19041 Primary osteoarthritis, right hand: Secondary | ICD-10-CM | POA: Diagnosis present

## 2017-09-06 DIAGNOSIS — D649 Anemia, unspecified: Secondary | ICD-10-CM | POA: Diagnosis present

## 2017-09-06 DIAGNOSIS — Z7984 Long term (current) use of oral hypoglycemic drugs: Secondary | ICD-10-CM | POA: Diagnosis not present

## 2017-09-06 DIAGNOSIS — E860 Dehydration: Secondary | ICD-10-CM | POA: Diagnosis present

## 2017-09-06 DIAGNOSIS — J101 Influenza due to other identified influenza virus with other respiratory manifestations: Secondary | ICD-10-CM | POA: Diagnosis present

## 2017-09-06 DIAGNOSIS — I340 Nonrheumatic mitral (valve) insufficiency: Secondary | ICD-10-CM | POA: Diagnosis not present

## 2017-09-06 DIAGNOSIS — Z9842 Cataract extraction status, left eye: Secondary | ICD-10-CM | POA: Diagnosis not present

## 2017-09-06 DIAGNOSIS — I959 Hypotension, unspecified: Secondary | ICD-10-CM | POA: Diagnosis not present

## 2017-09-06 DIAGNOSIS — T4275XA Adverse effect of unspecified antiepileptic and sedative-hypnotic drugs, initial encounter: Secondary | ICD-10-CM | POA: Diagnosis not present

## 2017-09-06 DIAGNOSIS — I110 Hypertensive heart disease with heart failure: Secondary | ICD-10-CM | POA: Diagnosis present

## 2017-09-06 DIAGNOSIS — J90 Pleural effusion, not elsewhere classified: Secondary | ICD-10-CM | POA: Diagnosis not present

## 2017-09-06 DIAGNOSIS — I462 Cardiac arrest due to underlying cardiac condition: Secondary | ICD-10-CM | POA: Diagnosis not present

## 2017-09-06 LAB — GLUCOSE, CAPILLARY
Glucose-Capillary: 164 mg/dL — ABNORMAL HIGH (ref 65–99)
Glucose-Capillary: 197 mg/dL — ABNORMAL HIGH (ref 65–99)
Glucose-Capillary: 212 mg/dL — ABNORMAL HIGH (ref 65–99)

## 2017-09-06 LAB — URINALYSIS, ROUTINE W REFLEX MICROSCOPIC
BACTERIA UA: NONE SEEN
BILIRUBIN URINE: NEGATIVE
Glucose, UA: NEGATIVE mg/dL
Hgb urine dipstick: NEGATIVE
KETONES UR: 5 mg/dL — AB
LEUKOCYTES UA: NEGATIVE
Nitrite: NEGATIVE
PROTEIN: 30 mg/dL — AB
SQUAMOUS EPITHELIAL / LPF: NONE SEEN
Specific Gravity, Urine: 1.017 (ref 1.005–1.030)
pH: 5 (ref 5.0–8.0)

## 2017-09-06 LAB — BLOOD GAS, ARTERIAL
ACID-BASE DEFICIT: 6.6 mmol/L — AB (ref 0.0–2.0)
Acid-base deficit: 9 mmol/L — ABNORMAL HIGH (ref 0.0–2.0)
BICARBONATE: 18.6 mmol/L — AB (ref 20.0–28.0)
Bicarbonate: 17.8 mmol/L — ABNORMAL LOW (ref 20.0–28.0)
Drawn by: 257701
Drawn by: 257701
FIO2: 100
MECHVT: 620 mL
O2 Content: 2 L/min
O2 Saturation: 91.3 %
O2 Saturation: 98.4 %
PCO2 ART: 38.2 mmHg (ref 32.0–48.0)
PEEP: 5 cmH2O
PH ART: 7.309 — AB (ref 7.350–7.450)
PO2 ART: 73.9 mmHg — AB (ref 83.0–108.0)
Patient temperature: 98.6
Patient temperature: 98.6
RATE: 16 {breaths}/min
pCO2 arterial: 44.1 mmHg (ref 32.0–48.0)
pH, Arterial: 7.23 — ABNORMAL LOW (ref 7.350–7.450)
pO2, Arterial: 218 mmHg — ABNORMAL HIGH (ref 83.0–108.0)

## 2017-09-06 LAB — CREATININE, SERUM: CREATININE: 1.03 mg/dL (ref 0.61–1.24)

## 2017-09-06 LAB — CBC WITH DIFFERENTIAL/PLATELET
BASOS ABS: 0 10*3/uL (ref 0.0–0.1)
BASOS PCT: 0 %
Eosinophils Absolute: 0.2 10*3/uL (ref 0.0–0.7)
Eosinophils Relative: 3 %
HEMATOCRIT: 36.1 % — AB (ref 39.0–52.0)
HEMOGLOBIN: 11.9 g/dL — AB (ref 13.0–17.0)
LYMPHS PCT: 9 %
Lymphs Abs: 0.8 10*3/uL (ref 0.7–4.0)
MCH: 30.1 pg (ref 26.0–34.0)
MCHC: 33 g/dL (ref 30.0–36.0)
MCV: 91.2 fL (ref 78.0–100.0)
MONO ABS: 0.7 10*3/uL (ref 0.1–1.0)
MONOS PCT: 8 %
NEUTROS ABS: 6.4 10*3/uL (ref 1.7–7.7)
NEUTROS PCT: 80 %
Platelets: 269 10*3/uL (ref 150–400)
RBC: 3.96 MIL/uL — ABNORMAL LOW (ref 4.22–5.81)
RDW: 13.9 % (ref 11.5–15.5)
WBC: 8.1 10*3/uL (ref 4.0–10.5)

## 2017-09-06 LAB — COMPREHENSIVE METABOLIC PANEL
ALBUMIN: 3.6 g/dL (ref 3.5–5.0)
ALT: 29 U/L (ref 17–63)
ANION GAP: 11 (ref 5–15)
AST: 22 U/L (ref 15–41)
Alkaline Phosphatase: 117 U/L (ref 38–126)
BUN: 19 mg/dL (ref 6–20)
CALCIUM: 9.5 mg/dL (ref 8.9–10.3)
CO2: 20 mmol/L — ABNORMAL LOW (ref 22–32)
Chloride: 106 mmol/L (ref 101–111)
Creatinine, Ser: 1.03 mg/dL (ref 0.61–1.24)
GFR calc non Af Amer: >60 mL/min (ref >60)
Glucose, Bld: 154 mg/dL — ABNORMAL HIGH (ref 65–99)
POTASSIUM: 5 mmol/L (ref 3.5–5.1)
Sodium: 137 mmol/L (ref 135–145)
Total Bilirubin: 1 mg/dL (ref 0.3–1.2)
Total Protein: 7.2 g/dL (ref 6.5–8.1)

## 2017-09-06 LAB — MRSA PCR SCREENING: MRSA by PCR: NEGATIVE

## 2017-09-06 LAB — I-STAT TROPONIN, ED: TROPONIN I, POC: 0.03 ng/mL (ref 0.00–0.08)

## 2017-09-06 LAB — BRAIN NATRIURETIC PEPTIDE: B Natriuretic Peptide: 868.1 pg/mL — ABNORMAL HIGH (ref 0.0–100.0)

## 2017-09-06 LAB — INFLUENZA PANEL BY PCR (TYPE A & B)
Influenza A By PCR: POSITIVE — AB
Influenza B By PCR: NEGATIVE

## 2017-09-06 LAB — I-STAT CG4 LACTIC ACID, ED: LACTIC ACID, VENOUS: 1.57 mmol/L (ref 0.5–1.9)

## 2017-09-06 LAB — PHOSPHORUS: Phosphorus: 4.7 mg/dL — ABNORMAL HIGH (ref 2.5–4.6)

## 2017-09-06 LAB — MAGNESIUM: MAGNESIUM: 1.9 mg/dL (ref 1.7–2.4)

## 2017-09-06 LAB — TROPONIN I: TROPONIN I: 0.05 ng/mL — AB (ref <0.03)

## 2017-09-06 LAB — TRIGLYCERIDES: Triglycerides: 97 mg/dL (ref <150)

## 2017-09-06 MED ORDER — FENTANYL CITRATE (PF) 100 MCG/2ML IJ SOLN
INTRAMUSCULAR | Status: AC
  Filled 2017-09-06: qty 2

## 2017-09-06 MED ORDER — IOPAMIDOL (ISOVUE-370) INJECTION 76%
INTRAVENOUS | Status: AC
  Filled 2017-09-06: qty 100

## 2017-09-06 MED ORDER — OSELTAMIVIR PHOSPHATE 75 MG PO CAPS
75.0000 mg | ORAL_CAPSULE | Freq: Two times a day (BID) | ORAL | Status: DC
  Filled 2017-09-06: qty 1

## 2017-09-06 MED ORDER — SODIUM CHLORIDE 0.9 % IV SOLN
0.5000 mg/h | INTRAVENOUS | Status: DC
  Administered 2017-09-06: 0.5 mg/h via INTRAVENOUS
  Filled 2017-09-06: qty 10

## 2017-09-06 MED ORDER — MIDAZOLAM HCL 2 MG/2ML IJ SOLN
INTRAMUSCULAR | Status: AC
  Filled 2017-09-06: qty 2

## 2017-09-06 MED ORDER — VITAL HIGH PROTEIN PO LIQD
1000.0000 mL | ORAL | Status: DC
  Administered 2017-09-06: 1000 mL
  Filled 2017-09-06: qty 1000

## 2017-09-06 MED ORDER — OSELTAMIVIR PHOSPHATE 6 MG/ML PO SUSR
75.0000 mg | Freq: Two times a day (BID) | ORAL | Status: DC
  Administered 2017-09-06 – 2017-09-07 (×3): 75 mg
  Filled 2017-09-06 (×5): qty 12.5

## 2017-09-06 MED ORDER — ORAL CARE MOUTH RINSE
15.0000 mL | Freq: Four times a day (QID) | OROMUCOSAL | Status: DC
  Administered 2017-09-06 – 2017-09-07 (×4): 15 mL via OROMUCOSAL

## 2017-09-06 MED ORDER — FUROSEMIDE 10 MG/ML IJ SOLN
40.0000 mg | Freq: Once | INTRAMUSCULAR | Status: AC
  Administered 2017-09-06: 40 mg via INTRAVENOUS
  Filled 2017-09-06: qty 4

## 2017-09-06 MED ORDER — ETOMIDATE 2 MG/ML IV SOLN
INTRAVENOUS | Status: AC | PRN
  Administered 2017-09-06: 20 mg via INTRAVENOUS

## 2017-09-06 MED ORDER — FENTANYL 2500MCG IN NS 250ML (10MCG/ML) PREMIX INFUSION
0.0000 ug/h | INTRAVENOUS | Status: DC
  Administered 2017-09-06: 100 ug/h via INTRAVENOUS
  Administered 2017-09-07: 200 ug/h via INTRAVENOUS
  Filled 2017-09-06 (×2): qty 250

## 2017-09-06 MED ORDER — FENTANYL CITRATE (PF) 100 MCG/2ML IJ SOLN: 50.0000 ug | Freq: Once | INTRAMUSCULAR | Status: DC

## 2017-09-06 MED ORDER — ENOXAPARIN SODIUM 30 MG/0.3ML ~~LOC~~ SOLN
30.0000 mg | Freq: Every day | SUBCUTANEOUS | Status: DC
  Administered 2017-09-06: 30 mg via SUBCUTANEOUS
  Filled 2017-09-06: qty 0.3

## 2017-09-06 MED ORDER — MIDAZOLAM HCL 2 MG/2ML IJ SOLN: 2.0000 mg | INTRAMUSCULAR | Status: DC | PRN

## 2017-09-06 MED ORDER — SODIUM CHLORIDE 0.9 % IV BOLUS (SEPSIS)
1000.0000 mL | Freq: Once | INTRAVENOUS | Status: AC
  Administered 2017-09-06: 1000 mL via INTRAVENOUS

## 2017-09-06 MED ORDER — DOCUSATE SODIUM 50 MG/5ML PO LIQD: 100.0000 mg | Freq: Two times a day (BID) | ORAL | Status: DC | PRN

## 2017-09-06 MED ORDER — ONDANSETRON HCL 4 MG/2ML IJ SOLN
4.0000 mg | Freq: Once | INTRAMUSCULAR | Status: AC
  Administered 2017-09-06: 4 mg via INTRAVENOUS
  Filled 2017-09-06: qty 2

## 2017-09-06 MED ORDER — ALBUTEROL SULFATE (2.5 MG/3ML) 0.083% IN NEBU
5.0000 mg | INHALATION_SOLUTION | Freq: Once | RESPIRATORY_TRACT | Status: AC
  Administered 2017-09-06: 5 mg via RESPIRATORY_TRACT
  Filled 2017-09-06: qty 6

## 2017-09-06 MED ORDER — FAMOTIDINE IN NACL 20-0.9 MG/50ML-% IV SOLN
20.0000 mg | Freq: Two times a day (BID) | INTRAVENOUS | Status: DC
  Administered 2017-09-06 – 2017-09-07 (×2): 20 mg via INTRAVENOUS
  Filled 2017-09-06 (×3): qty 50

## 2017-09-06 MED ORDER — PROPOFOL 1000 MG/100ML IV EMUL
0.0000 ug/kg/min | INTRAVENOUS | Status: DC
  Administered 2017-09-06 (×2): 40 ug/kg/min via INTRAVENOUS
  Administered 2017-09-06: 30 ug/kg/min via INTRAVENOUS
  Administered 2017-09-07: 8 ug/kg/min via INTRAVENOUS
  Filled 2017-09-06 (×4): qty 100

## 2017-09-06 MED ORDER — METHYLPREDNISOLONE SODIUM SUCC 125 MG IJ SOLR
125.0000 mg | Freq: Once | INTRAMUSCULAR | Status: AC
  Administered 2017-09-06: 125 mg via INTRAVENOUS
  Filled 2017-09-06: qty 2

## 2017-09-06 MED ORDER — INSULIN ASPART 100 UNIT/ML ~~LOC~~ SOLN
0.0000 [IU] | SUBCUTANEOUS | Status: DC
  Administered 2017-09-06: 5 [IU] via SUBCUTANEOUS
  Administered 2017-09-06: 3 [IU] via SUBCUTANEOUS
  Administered 2017-09-07: 5 [IU] via SUBCUTANEOUS
  Administered 2017-09-07 (×2): 3 [IU] via SUBCUTANEOUS

## 2017-09-06 MED ORDER — FENTANYL CITRATE (PF) 100 MCG/2ML IJ SOLN: 100.0000 ug | INTRAMUSCULAR | Status: DC | PRN

## 2017-09-06 MED ORDER — MIDAZOLAM HCL 2 MG/2ML IJ SOLN
4.0000 mg | Freq: Once | INTRAMUSCULAR | Status: AC
  Administered 2017-09-06: 4 mg via INTRAVENOUS

## 2017-09-06 MED ORDER — MAGNESIUM SULFATE 2 GM/50ML IV SOLN
2.0000 g | Freq: Once | INTRAVENOUS | Status: AC
  Administered 2017-09-06: 2 g via INTRAVENOUS
  Filled 2017-09-06: qty 50

## 2017-09-06 MED ORDER — ONDANSETRON HCL 4 MG/2ML IJ SOLN: 4.0000 mg | Freq: Four times a day (QID) | INTRAMUSCULAR | Status: DC | PRN

## 2017-09-06 MED ORDER — LORAZEPAM 2 MG/ML IJ SOLN
1.0000 mg | Freq: Once | INTRAMUSCULAR | Status: AC
  Administered 2017-09-06: 1 mg via INTRAVENOUS
  Filled 2017-09-06: qty 1

## 2017-09-06 MED ORDER — IOPAMIDOL (ISOVUE-370) INJECTION 76%
100.0000 mL | Freq: Once | INTRAVENOUS | Status: AC | PRN
  Administered 2017-09-06: 100 mL via INTRAVENOUS

## 2017-09-06 MED ORDER — ALBUTEROL SULFATE (2.5 MG/3ML) 0.083% IN NEBU: 2.5000 mg | INHALATION_SOLUTION | RESPIRATORY_TRACT | Status: DC | PRN

## 2017-09-06 MED ORDER — IPRATROPIUM BROMIDE 0.02 % IN SOLN
0.5000 mg | Freq: Once | RESPIRATORY_TRACT | Status: AC
  Administered 2017-09-06: 0.5 mg via RESPIRATORY_TRACT
  Filled 2017-09-06: qty 2.5

## 2017-09-06 MED ORDER — FENTANYL CITRATE (PF) 100 MCG/2ML IJ SOLN
100.0000 ug | Freq: Once | INTRAMUSCULAR | Status: AC
  Administered 2017-09-06: 100 ug via INTRAVENOUS

## 2017-09-06 MED ORDER — MIDAZOLAM HCL 2 MG/2ML IJ SOLN
2.0000 mg | Freq: Once | INTRAMUSCULAR | Status: AC
  Administered 2017-09-06: 2 mg via INTRAVENOUS

## 2017-09-06 MED ORDER — ENOXAPARIN SODIUM 40 MG/0.4ML ~~LOC~~ SOLN: 40.0000 mg | SUBCUTANEOUS | Status: DC

## 2017-09-06 MED ORDER — PRO-STAT SUGAR FREE PO LIQD
30.0000 mL | Freq: Two times a day (BID) | ORAL | Status: DC
  Administered 2017-09-06 – 2017-09-07 (×2): 30 mL
  Filled 2017-09-06 (×2): qty 30

## 2017-09-06 MED ORDER — CHLORHEXIDINE GLUCONATE 0.12% ORAL RINSE (MEDLINE KIT)
15.0000 mL | Freq: Two times a day (BID) | OROMUCOSAL | Status: DC
  Administered 2017-09-06 – 2017-09-07 (×2): 15 mL via OROMUCOSAL

## 2017-09-06 MED ORDER — SODIUM CHLORIDE 0.9 % IV SOLN
250.0000 mL | INTRAVENOUS | Status: DC | PRN
  Administered 2017-09-06: 250 mL via INTRAVENOUS

## 2017-09-06 MED ORDER — MIDAZOLAM HCL 2 MG/2ML IJ SOLN
INTRAMUSCULAR | Status: AC
  Administered 2017-09-06: 2 mg via INTRAVENOUS
  Filled 2017-09-06: qty 4

## 2017-09-06 MED ORDER — ACETAMINOPHEN 325 MG PO TABS
650.0000 mg | ORAL_TABLET | ORAL | Status: DC | PRN
  Administered 2017-09-07 (×3): 650 mg via ORAL
  Filled 2017-09-06 (×3): qty 2

## 2017-09-06 MED ORDER — FUROSEMIDE 10 MG/ML IJ SOLN
80.0000 mg | Freq: Once | INTRAMUSCULAR | Status: AC
  Administered 2017-09-06: 80 mg via INTRAVENOUS
  Filled 2017-09-06: qty 8

## 2017-09-06 MED ORDER — SODIUM CHLORIDE 0.9 % IJ SOLN
INTRAMUSCULAR | Status: AC
  Filled 2017-09-06: qty 50

## 2017-09-06 MED ORDER — SUCCINYLCHOLINE CHLORIDE 20 MG/ML IJ SOLN
INTRAMUSCULAR | Status: AC | PRN
  Administered 2017-09-06: 100 mg via INTRAVENOUS

## 2017-09-06 NOTE — Code Documentation (Signed)
CXRAY at bedside

## 2017-09-06 NOTE — Code Documentation (Signed)
Dr Silverio LayYao attempting intubation using bougie and glide-o-scope

## 2017-09-06 NOTE — ED Notes (Signed)
RT called

## 2017-09-06 NOTE — ED Triage Notes (Signed)
Pt complaint of ongoing cough for a week and a half; pain with cough only. Worsening SOB with such.

## 2017-09-06 NOTE — ED Notes (Signed)
This Clinical research associatewriter called to bedside by CT tech. Patient diaphoretic with labored breathing upon assessment. MD called to bedside. Respiratory paged.

## 2017-09-06 NOTE — ED Notes (Signed)
ED TO INPATIENT HANDOFF REPORT  Name/Age/Gender Stephen Fitzgerald 57 y.o. male  Code Status    Code Status Orders  (From admission, onward)        Start     Ordered   09/05/2017 1404  Full code  Continuous     08/31/2017 1406    Code Status History    Date Active Date Inactive Code Status Order ID Comments User Context   10/20/2016 21:35 10/23/2016 21:36 Full Code [id number]  Newt Minion, MD Inpatient   08/13/2014 15:22 08/17/2014 19:00 Full Code [id number]  Conrad Aguadilla, MD Inpatient   08/11/2014 21:08 08/13/2014 15:22 Full Code [id number]  Leone Brand, MD Inpatient      Home/SNF/Other Home  Chief Complaint SOB / neck chest pain   Level of Care/Admitting Diagnosis ED Disposition    ED Disposition Condition [place]: [facility] [[id number]]  Level of Care: ICU [6]  Diagnosis: Influenza A [008676]  Admitting Physician: Eustaquio Boyden  Attending Physician: Juanito Doom [[id number]]  Estimated length of stay: past midnight tomorrow  Certification:: I certify this patient will need inpatient services for at least 2 midnights  PT Class (Do Not Modify): Inpatient [101]  PT Acc Code (Do Not Modify): Private [1]       Medical History Past Medical History:  Diagnosis Date  . Abscess of hand, left 12/08/2012   left hand with only 3rd to 5th finger, 4th finger to midjoint.- reddened but dry and intact scar line.  . 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 Arthritis    ankles & shoulders   . Bacteremia   . Blood poisoning   . Diabetes mellitus without complication (HCC)    not usually checking blood sugars daily.  . Flexor tenosynovitis of finger 12/08/2012  . Heart murmur   . Hypertension   . Stroke [facility])    expressive aphasia, R sided weakness   . Toe osteomyelitis, right (HCC)    right second toe  . Vitamin B12 deficiency 08/18/2014    Allergies Allergies  Allergen Reactions   . Unasyn [Ampicillin-Sulbactam Sodium] Rash    Has patient had a PCN reaction causing immediate rash, facial/tongue/throat swelling, SOB or lightheadedness with hypotension: No Has patient had a PCN reaction causing severe rash involving mucus membranes or skin necrosis: No Has patient had a PCN reaction that required hospitalization: No Has patient had a PCN reaction occurring within the last 10 years: No If all of the above answers are "NO", then may proceed with Cephalosporin use.     IV Location/Drains/Wounds Patient Lines/Drains/Airways Status   Active Line/Drains/Airways    Name:   Placement date:   Placement time:   Site:   Days:   Peripheral IV 09/01/2017 Right;Posterior Forearm   09/02/2017    0952    Forearm   less than 1   Peripheral IV 08/30/2017 Left Forearm   08/19/2017    1004    Forearm   less than 1   NG/OG Tube Orogastric 18 Fr. Center mouth Xray   09/13/2017    1252    Center mouth   less than 1   Urethral Catheter ABRAWNER,RN Double-lumen 18 Fr.   09/02/2017    1325    Double-lumen   less than 1   Airway 8 mm   08/28/2017    1246     less than 1  Incision (Closed) 08/24/16 Foot Left   08/24/16    1119     378   Incision (Closed) 10/03/16 Foot Left   10/03/16    1431     338   Incision (Closed) 10/20/16 Foot Left   10/20/16    1843     321          Labs/Imaging Results for orders placed or performed during the hospital encounter of 08/28/2017 (from the past 48 hour(s))  Troponin I     Status: Abnormal   Collection Time: 09/01/2017  9:58 AM  Result Value Ref Range   Troponin I 0.05 (HH) <0.03 ng/mL    Comment: CRITICAL RESULT CALLED TO, READ BACK BY AND VERIFIED WITH: WEST,S. RN @1529  ON 02.21.19 BY COHEN,K Performed at [facility], [place] [address], [place], [place] [address]   Triglycerides     Status: None   Collection Time: 08/21/2017  9:58 AM  Result Value Ref Range   Triglycerides 97 <150 mg/dL    Comment: Performed at [facility],  [place] [address] [place], [place] [address]  Creatinine, serum     Status: None   Collection Time: 08/26/2017  9:58 AM  Result Value Ref Range   Creatinine, Ser 1.03 0.61 - 1.24 mg/dL   GFR calc non Af Amer >60 >60 mL/min   GFR calc Af Amer >60 >60 mL/min    Comment: (NOTE) The eGFR has been calculated using the CKD EPI equation. This calculation has not been validated in all clinical situations. eGFR's persistently <60 mL/min signify possible Chronic Kidney Disease. Performed at [facility], [place] [address], [place], [place] [address]   Comprehensive metabolic panel     Status: Abnormal   Collection Time: 09/09/2017  9:59 AM  Result Value Ref Range   Sodium 137 135 - 145 mmol/L   Potassium 5.0 3.5 - 5.1 mmol/L   Chloride 106 101 - 111 mmol/L   CO2 20 (L) 22 - 32 mmol/L   Glucose, Bld 154 (H) 65 - 99 mg/dL   BUN 19 6 - 20 mg/dL   Creatinine, Ser 1.03 0.61 - 1.24 mg/dL   Calcium 9.5 8.9 - 10.3 mg/dL   Total Protein 7.2 6.5 - 8.1 g/dL   Albumin 3.6 3.5 - 5.0 g/dL   AST 22 15 - 41 U/L   ALT 29 17 - 63 U/L   Alkaline Phosphatase 117 38 - 126 U/L   Total Bilirubin 1.0 0.3 - 1.2 mg/dL   GFR calc non Af Amer >60 >60 mL/min   GFR calc Af Amer >60 >60 mL/min    Comment: (NOTE) The eGFR has been calculated using the CKD EPI equation. This calculation has not been validated in all clinical situations. eGFR's persistently <60 mL/min signify possible Chronic Kidney Disease.    Anion gap 11 5 - 15    Comment: Performed at [facility], [place] [address] [place], [place] [address]  CBC with Differential     Status: Abnormal   Collection Time: 09/10/2017  9:59 AM  Result Value Ref Range   WBC 8.1 4.0 - 10.5 K/uL   RBC 3.96 (L) 4.22 - 5.81 MIL/uL   Hemoglobin 11.9 (L) 13.0 - 17.0 g/dL   HCT 36.1 (L) 39.0 - 52.0 %   MCV 91.2 78.0 - 100.0 fL   MCH 30.1 26.0 - 34.0 pg   MCHC 33.0 30.0 - 36.0 g/dL   RDW 13.9 11.5 - 15.5 %  Platelets 269 150 - 400 K/uL    Neutrophils Relative % 80 %   Neutro Abs 6.4 1.7 - 7.7 K/uL   Lymphocytes Relative 9 %   Lymphs Abs 0.8 0.7 - 4.0 K/uL   Monocytes Relative 8 %   Monocytes Absolute 0.7 0.1 - 1.0 K/uL   Eosinophils Relative 3 %   Eosinophils Absolute 0.2 0.0 - 0.7 K/uL   Basophils Relative 0 %   Basophils Absolute 0.0 0.0 - 0.1 K/uL    Comment: Performed at [facility], [place] [address], [place], [place] [address]  Urinalysis, Routine w reflex microscopic     Status: Abnormal   Collection Time: 08/26/2017  9:59 AM  Result Value Ref Range   Color, Urine YELLOW YELLOW   APPearance HAZY (A) CLEAR   Specific Gravity, Urine 1.017 1.005 - 1.030   pH 5.0 5.0 - 8.0   Glucose, UA NEGATIVE NEGATIVE mg/dL   Hgb urine dipstick NEGATIVE NEGATIVE   Bilirubin Urine NEGATIVE NEGATIVE   Ketones, ur 5 (A) NEGATIVE mg/dL   Protein, ur 30 (A) NEGATIVE mg/dL   Nitrite NEGATIVE NEGATIVE   Leukocytes, UA NEGATIVE NEGATIVE   RBC / HPF 0-5 0 - 5 RBC/hpf   WBC, UA 0-5 0 - 5 WBC/hpf   Bacteria, UA NONE SEEN NONE SEEN   Squamous Epithelial / LPF NONE SEEN NONE SEEN   Mucus PRESENT    Hyaline Casts, UA PRESENT     Comment: Performed at [facility], [place] [address] [place], [place] [address]  Influenza panel by PCR (type A & B)     Status: Abnormal   Collection Time: 09/01/2017  9:59 AM  Result Value Ref Range   Influenza A By PCR POSITIVE (A) NEGATIVE   Influenza B By PCR NEGATIVE NEGATIVE    Comment: (NOTE) The Xpert Xpress Flu assay is intended as an aid in the diagnosis of  influenza and should not be used as a sole basis for treatment.  This  assay is FDA approved for nasopharyngeal swab specimens only. Nasal  washings and aspirates are unacceptable for Xpert Xpress Flu testing. Performed at [facility], [place] [address] [place], [place] [address]   Brain natriuretic peptide     Status: Abnormal   Collection Time: 08/18/2017  9:59 AM  Result Value Ref Range    B Natriuretic Peptide 868.1 (H) 0.0 - 100.0 pg/mL    Comment: Performed at [facility], [place] [address] [place], [place] [address]  I-stat troponin, ED     Status: None   Collection Time: 09/09/2017 10:03 AM  Result Value Ref Range   Troponin i, poc 0.03 0.00 - 0.08 ng/mL   Comment 3            Comment: Due to the release kinetics of cTnI, a negative result within the first hours of the onset of symptoms does not rule out myocardial infarction with certainty. If myocardial infarction is still suspected, repeat the test at appropriate intervals.   I-Stat CG4 Lactic Acid, ED     Status: None   Collection Time: 08/25/2017 10:04 AM  Result Value Ref Range   Lactic Acid, Venous 1.57 0.5 - 1.9 mmol/L  Blood gas, arterial     Status: Abnormal   Collection Time: 08/27/2017 11:26 AM  Result Value Ref Range   O2 Content 2.0 L/min   pH, Arterial 7.309 (L) 7.350 - 7.450   pCO2 arterial 38.2 32.0 - 48.0  mmHg   pO2, Arterial 73.9 (L) 83.0 - 108.0 mmHg   Bicarbonate 18.6 (L) 20.0 - 28.0 mmol/L   Acid-base deficit 6.6 (H) 0.0 - 2.0 mmol/L   O2 Saturation 91.3 %   Patient temperature 98.6    Collection site RIGHT BRACHIAL    Drawn by [id number]    Sample type ARTERIAL DRAW     Comment: Performed at [facility], [place] [address], [place], [place] [address]  Blood gas, arterial     Status: Abnormal   Collection Time: 08/19/2017  2:05 PM  Result Value Ref Range   FIO2 100.00    Delivery systems VENTILATOR    Mode PRESSURE REGULATED VOLUME CONTROL    VT 620 mL   LHR 16 resp/min   Peep/cpap 5.0 cm H20   pH, Arterial 7.230 (L) 7.350 - 7.450   pCO2 arterial 44.1 32.0 - 48.0 mmHg   pO2, Arterial 218 (H) 83.0 - 108.0 mmHg   Bicarbonate 17.8 (L) 20.0 - 28.0 mmol/L   Acid-base deficit 9.0 (H) 0.0 - 2.0 mmol/L   O2 Saturation 98.4 %   Patient temperature 98.6    Collection site RB    Drawn by [id number]    Sample type ARTERIAL DRAW     Comment: Performed at [facility], [place] [address], [place], [place] [address]   Dg Chest 2 View  Result Date: 08/28/2017 CLINICAL DATA:  Shortness of breath. EXAM: CHEST  2 VIEW COMPARISON:  Radiograph of August 11, 2014. FINDINGS: Probable cardiomegaly is noted with central pulmonary vascular congestion. Hypoinflation of the lungs is noted with probable bibasilar subsegmental atelectasis. Bilateral perihilar edema cannot be excluded. Mild bilateral pleural effusions are noted. Bony thorax is unremarkable. No pneumothorax is noted. IMPRESSION: Probable cardiomegaly with central pulmonary vascular congestion and bilateral pulmonary edema. Mild bilateral pleural effusions are noted. Hypoinflation of the lungs is noted with probable bibasilar subsegmental atelectasis. Electronically Signed   By: Marijo Conception, M.D.   On: 08/26/2017 10:44   Ct Angio Chest Pe W And/or Wo Contrast  Result Date: 09/01/2017 CLINICAL DATA:  Ongoing cough for a week. Short of breath. Abdominal distention. Patient difficulty maintaining stool position on CT scanner. Patient intubated after scan complete. EXAM: CT ANGIOGRAPHY CHEST CT ABDOMEN AND PELVIS WITH CONTRAST TECHNIQUE: Multidetector CT imaging of the chest was performed using the standard protocol during bolus administration of intravenous contrast. Multiplanar CT image reconstructions and MIPs were obtained to evaluate the vascular anatomy. Multidetector CT imaging of the abdomen and pelvis was performed using the standard protocol during bolus administration of intravenous contrast. CONTRAST:  100 mL Isovue. COMPARISON:  None. FINDINGS: CTA CHEST FINDINGS Cardiovascular: Exam is limited by patient body motion and respiratory motion. Additionally arms moving over chest. No filling defects within the proximal pulmonary arteries. The aortic arch is not pass 5 but no acute findings are identified. Coronary artery calcification and aortic atherosclerotic calcification. No pericardial  effusion. Mediastinum/Nodes: No axillary supraclavicular adenopathy. No mediastinal hilar adenopathy. No pericardial effusion Lungs/Pleura: . bilateral moderate pleural effusions. Mild bibasilar atelectasis. No pulmonary infarction or infiltrate identified. No pneumothorax. Musculoskeletal: No fracture Review of the MIP images confirms the above findings. CT ABDOMEN and PELVIS FINDINGS Severe body motion degrades imaging. Hepatobiliary: No focal hepatic lesion. No biliary duct dilatation. Gallbladder is normal. Common bile duct is normal. Pancreas: Pancreas is normal. No ductal dilatation. No pancreatic inflammation. Spleen: Normal spleen Adrenals/urinary tract: Adrenal glands and kidneys are normal. The ureters and  bladder normal. Stomach/Bowel: Stomach, small bowel, appendix, and cecum are normal. The colon and rectosigmoid colon are normal. Vascular/Lymphatic: Abdominal aorta is normal caliber with atherosclerotic calcification. There is no retroperitoneal or periportal lymphadenopathy. No pelvic lymphadenopathy. Reproductive: Prostate normal Other: No free fluid. Musculoskeletal: No aggressive osseous lesion. Review of the MIP images confirms the above findings. IMPRESSION: 1. Exam is limited by patient body motion and respiratory motion. 2. No evidence acute pulmonary embolism. 3. Bilateral moderate size pleural effusions and associated basilar atelectasis. 4. No acute findings in the abdomen pelvis. 5.  Coronary artery and aortic Atherosclerosis (ICD10-I70.0). Electronically Signed   By: Suzy Bouchard M.D.   On: 09/05/2017 12:54   Ct Abdomen Pelvis W Contrast  Result Date: 09/08/2017 CLINICAL DATA:  Ongoing cough for a week. Short of breath. Abdominal distention. Patient difficulty maintaining stool position on CT scanner. Patient intubated after scan complete. EXAM: CT ANGIOGRAPHY CHEST CT ABDOMEN AND PELVIS WITH CONTRAST TECHNIQUE: Multidetector CT imaging of the chest was performed using the standard  protocol during bolus administration of intravenous contrast. Multiplanar CT image reconstructions and MIPs were obtained to evaluate the vascular anatomy. Multidetector CT imaging of the abdomen and pelvis was performed using the standard protocol during bolus administration of intravenous contrast. CONTRAST:  100 mL Isovue. COMPARISON:  None. FINDINGS: CTA CHEST FINDINGS Cardiovascular: Exam is limited by patient body motion and respiratory motion. Additionally arms moving over chest. No filling defects within the proximal pulmonary arteries. The aortic arch is not pass 5 but no acute findings are identified. Coronary artery calcification and aortic atherosclerotic calcification. No pericardial effusion. Mediastinum/Nodes: No axillary supraclavicular adenopathy. No mediastinal hilar adenopathy. No pericardial effusion Lungs/Pleura: . bilateral moderate pleural effusions. Mild bibasilar atelectasis. No pulmonary infarction or infiltrate identified. No pneumothorax. Musculoskeletal: No fracture Review of the MIP images confirms the above findings. CT ABDOMEN and PELVIS FINDINGS Severe body motion degrades imaging. Hepatobiliary: No focal hepatic lesion. No biliary duct dilatation. Gallbladder is normal. Common bile duct is normal. Pancreas: Pancreas is normal. No ductal dilatation. No pancreatic inflammation. Spleen: Normal spleen Adrenals/urinary tract: Adrenal glands and kidneys are normal. The ureters and bladder normal. Stomach/Bowel: Stomach, small bowel, appendix, and cecum are normal. The colon and rectosigmoid colon are normal. Vascular/Lymphatic: Abdominal aorta is normal caliber with atherosclerotic calcification. There is no retroperitoneal or periportal lymphadenopathy. No pelvic lymphadenopathy. Reproductive: Prostate normal Other: No free fluid. Musculoskeletal: No aggressive osseous lesion. Review of the MIP images confirms the above findings. IMPRESSION: 1. Exam is limited by patient body motion and  respiratory motion. 2. No evidence acute pulmonary embolism. 3. Bilateral moderate size pleural effusions and associated basilar atelectasis. 4. No acute findings in the abdomen pelvis. 5.  Coronary artery and aortic Atherosclerosis (ICD10-I70.0). Electronically Signed   By: Suzy Bouchard M.D.   On: 08/23/2017 12:54   Dg Chest Port 1 View  Result Date: 08/29/2017 CLINICAL DATA:  Cyanosis. EXAM: PORTABLE CHEST 1 VIEW COMPARISON:  Earlier the same day FINDINGS: Interval intubation with endotracheal tube tip approximately 3 cm above the base of the carina. The NG tube passes into the stomach although the distal tip position is not included on the film. Lung volumes are low. The cardio pericardial silhouette is enlarged. Interval progression of bilateral parahilar opacity suggests edema. Small bilateral pleural effusions again noted. The visualized bony structures of the thorax are intact. Telemetry leads overlie the chest. IMPRESSION: 1. Interval intubation with appropriate position of endotracheal tube by x-ray. 2. Lower lung volumes with progressive  pulmonary edema pattern and small bilateral pleural effusions. Electronically Signed   By: Misty Stanley M.D.   On: 08/29/2017 13:01    Pending Labs Unresulted Labs (From admission, onward)   Start     Ordered   03-Oct-2017 0500  CBC  Tomorrow morning,   R     08/22/2017 1406   2017-10-03 [id number]  Basic metabolic panel  Tomorrow morning,   R     09/05/2017 1406   03-Oct-2017 0500  Blood gas, arterial  Tomorrow morning,   R     09/10/2017 1406   09/02/2017 1700  Magnesium  (ICU Tube Feeding: PEPuP )  5A & 5P,   R     09/02/2017 1422   09/12/2017 1700  Phosphorus  (ICU Tube Feeding: PEPuP )  5A & 5P,   R     09/08/2017 1422   09/03/2017 1405  Legionella Pneumophila Serogp 1 Ur Ag  Once,   R     08/19/2017 1406   08/21/2017 1405  Strep pneumoniae urinary antigen  (not at [facility])  Once,   R     08/22/2017 1406   08/31/2017 1402  HIV antibody (Routine Testing)  Once,   R     09/11/2017  1406   09/13/2017 0930  Blood culture (routine x 2)  BLOOD CULTURE X 2,   STAT     09/02/2017 0930   09/08/2017 0930  Urine culture  STAT,   STAT     08/28/2017 0930      Vitals/Pain Today's Vitals   09/05/2017 1545 08/17/2017 1600 08/25/2017 1615 09/12/2017 1630  BP: 99/75 97/74 102/77 98/76  Pulse: 94 91 91   Resp: 20 20 20 18   Temp:      TempSrc:      SpO2: 96% 97% 98%   Weight:      Height:        Isolation Precautions Droplet precaution  Medications Medications  sodium chloride 0.9 % injection (not administered)  iopamidol (ISOVUE-370) 76 % injection (not administered)  fentaNYL 2524mg in NS 2557m(1014mml) infusion-PREMIX (100 mcg/hr Intravenous New Bag/Given 08/27/2017 1432)  0.9 %  sodium chloride infusion (250 mLs Intravenous New Bag/Given 08/28/2017 1451)  acetaminophen (TYLENOL) tablet 650 mg (not administered)  ondansetron (ZOFRAN) injection 4 mg (not administered)  famotidine (PEPCID) IVPB 20 mg in NS 100 mL IVPB (not administered)  albuterol (PROVENTIL) (2.5 MG/3ML) 0.083% nebulizer solution 2.5 mg (not administered)  chlorhexidine gluconate (MEDLINE KIT) (PERIDEX) 0.12 % solution 15 mL (not administered)  MEDLINE mouth rinse (not administered)  propofol (DIPRIVAN) 1000 MG/100ML infusion (30 mcg/kg/min  99.8 kg Intravenous New Bag/Given 08/19/2017 1450)  fentaNYL (SUBLIMAZE) injection 100 mcg (not administered)  fentaNYL (SUBLIMAZE) injection 100 mcg (not administered)  midazolam (VERSED) injection 2 mg (not administered)  midazolam (VERSED) injection 2 mg (not administered)  docusate (COLACE) 50 MG/5ML liquid 100 mg (not administered)  feeding supplement (VITAL HIGH PROTEIN) liquid 1,000 mL (not administered)  feeding supplement (PRO-STAT SUGAR FREE 64) liquid 30 mL (not administered)  insulin aspart (novoLOG) injection 0-15 Units (not administered)  oseltamivir (TAMIFLU) 6 MG/ML suspension 75 mg (not administered)  furosemide (LASIX) injection 40 mg (not administered)  sodium  chloride 0.9 % bolus 1,000 mL (0 mLs Intravenous Stopped 08/28/2017 1100)  albuterol (PROVENTIL) (2.5 MG/3ML) 0.083% nebulizer solution 5 mg (5 mg Nebulization Given 08/24/2017 1108)  ipratropium (ATROVENT) nebulizer solution 0.5 mg (0.5 mg Nebulization Given 08/30/2017 1108)  methylPREDNISolone sodium succinate (SOLU-MEDROL) 125 mg/2 mL injection 125  mg (125 mg Intravenous Given 09/09/2017 1122)  iopamidol (ISOVUE-370) 76 % injection 100 mL (100 mLs Intravenous Contrast Given 09/12/2017 1209)  sodium chloride 0.9 % bolus 1,000 mL (0 mLs Intravenous Stopped 08/20/2017 1451)  ondansetron (ZOFRAN) injection 4 mg (4 mg Intravenous Given 09/03/2017 1122)  magnesium sulfate IVPB 2 g 50 mL (0 g Intravenous Stopped 09/05/2017 1238)  LORazepam (ATIVAN) injection 1 mg (1 mg Intravenous Given 08/23/2017 1159)  furosemide (LASIX) injection 80 mg (80 mg Intravenous Given 09/11/2017 1337)  etomidate (AMIDATE) injection (20 mg Intravenous Given 08/19/2017 1243)  succinylcholine (ANECTINE) injection (100 mg Intravenous Given 09/03/2017 1244)  midazolam (VERSED) injection 4 mg (4 mg Intravenous Given 08/26/2017 1304)  fentaNYL (SUBLIMAZE) injection 100 mcg (100 mcg Intravenous Given 09/05/2017 1304)  midazolam (VERSED) injection 2 mg (2 mg Intravenous Given 09/10/2017 1403)    Mobility non-ambulatory

## 2017-09-06 NOTE — Progress Notes (Signed)
PO2 on ABG (PT on 2 lpm [place])= 73.9 (s02= 91%)- RT increased PT to 3 lpm [place]- rn aware.

## 2017-09-06 NOTE — ED Notes (Signed)
Patient desaturating on 4LNC. Patient labored and cyanotic. Patient placed on NRBM 15L. Dr Silverio LayYao and RT to bedside.

## 2017-09-06 NOTE — Code Documentation (Signed)
Patient nods consent to intubation

## 2017-09-06 NOTE — H&P (Signed)
PULMONARY / CRITICAL CARE MEDICINE   Name: Stephen Fitzgerald MRN: [id number] DOB: September 13, 1960    ADMISSION DATE:  09/27/2017 CONSULTATION DATE: 09-27-2017   REFERRING MD:  Dr. Silverio Lay  CHIEF COMPLAINT:  shortness of breath and abdominal pain  HISTORY OF PRESENT ILLNESS:   57 year old male with a past medical history significant for diabetes mellitus presented with a chief complaint of shortness of breath and some abdominal pain.  He was intubated and sedated upon my arrival so I was unable to obtain a history other than talking to his caregivers and reviewing the record.  He was intubated in the emergency department due to worsening shortness of breath.  Apparently this was his predominant complaint though he did note some abdominal pain.  He denied nausea vomiting or diarrhea.  He did not say when his last bowel movement was.  He was noted to be flu a positive after admission.  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  PAST SURGICAL HISTORY: He  has a past surgical history that includes I&D extremity (Left, 12/04/2012); Amputation (Left, 12/06/2012); I&D extremity (Left, 12/06/2012); Tendon repair (Left, 12/23/2012); abdominal aortagram (N/A, 08/13/2014); Amputation (Left, 08/15/2014); Amputation (Right, 08/15/2014); Amputation (Right, 12/10/2015); Amputation (Left, 08/24/2016); I&D extremity (Left, 10/03/2016); Tonsillectomy; Transmetatarsal amputation (Left, 10/20/2016); Application if wound vac (Left, 10/20/2016); Cataract extraction w/ intraocular lens  implant, bilateral (Bilateral); Inguinal hernia repair (Bilateral, 1964); and Amputation (Left,  10/20/2016).  Allergies  Allergen Reactions  . Unasyn [Ampicillin-Sulbactam Sodium] Rash    Has patient had a PCN reaction causing immediate rash, facial/tongue/throat swelling, SOB or lightheadedness with hypotension: No Has patient had a PCN reaction causing severe rash involving mucus membranes or skin necrosis: No Has patient had a PCN reaction that required hospitalization: No Has patient had a PCN reaction occurring within the last 10 years: No If all of the above answers are "NO", then may proceed with Cephalosporin use.     No current facility-administered medications on file prior to encounter.    Current Outpatient Medications on File Prior to Encounter  Medication Sig  . aspirin 81 MG tablet Take 1 tablet (81 mg total) by mouth daily.  Marland Kitchen aspirin-sod bicarb-citric acid (ALKA-SELTZER) 325 MG TBEF tablet Take 325 mg by mouth every 6 (six) hours as needed.  Marland Kitchen atorvastatin (LIPITOR) 40 MG tablet TAKE 1 TABLET BY MOUTH DAILY  . Cyanocobalamin (B-12) 2000 MCG TABS Take 2,000 mcg by mouth daily.   Marland Kitchen glipiZIDE (GLUCOTROL) 5 MG tablet Take 1 tablet (5 mg total) by mouth 2 (two) times daily before a meal.  . metFORMIN (GLUCOPHAGE-XR) 500 MG 24 hr tablet TAKE 2 TABLETS BY MOUTH TWICE A DAY WITH MEALS.  . metoprolol succinate (TOPROL-XL) 50 MG 24 hr tablet Take 1 tablet (50 mg total) by mouth daily. Take with or immediately following a meal.  . Multiple Vitamins-Minerals (CENTRUM SILVER 50+MEN PO) Take by mouth.  . Naphazoline-Glycerin (CLEAR EYES MAX REDNESS RELIEF OP) Apply 2 drops to eye daily as needed (dry eyes).   . naproxen sodium (ANAPROX) 220 MG tablet Take 220 mg by mouth 2 (two) times daily as needed (pain).  Marland Kitchen silver sulfADIAZINE (SILVADENE) 1 % cream Apply 1 application topically daily. (Patient not taking: Reported  on 05/08/2017)    FAMILY HISTORY:  His has no family status information on file.    SOCIAL HISTORY: He  reports that  has never smoked. he has never used  smokeless tobacco. He reports that he drinks about 33.6 oz of alcohol per week. He reports that he does not use drugs.  REVIEW OF SYSTEMS:   Cannot obtain due to intubation  SUBJECTIVE:  As above  VITAL SIGNS: BP 114/82   Pulse (!) 112   Temp 98.7 F (37.1 C) (Oral)   Resp 16   Ht 6\' 1"  (1.854 m)   Wt 220 lb (99.8 kg)   SpO2 100%   BMI 29.03 kg/m   HEMODYNAMICS:    VENTILATOR SETTINGS: Vent Mode: PRVC FiO2 (%):  [100 %] 100 % Set Rate:  [16 bmp] 16 bmp Vt Set:  [161[620 mL] 620 mL PEEP:  [5 cmH20] 5 cmH20 Plateau Pressure:  [22 cmH20] 22 cmH20  INTAKE / OUTPUT: No intake/output data recorded.  PHYSICAL EXAMINATION:  General:  In bed on vent HENT: NCAT ETT in place PULM: few crackles, minimal wheezing, vent supported breathing CV: RRR, blowing systolic murmur RUSB GI: minimal bowel sounds, belly distended MSK: normal bulk and tone, s/p partial foot amputation L foot Neuro: sedated on vent   LABS:  BMET Recent Labs  Lab 2017/09/16 0959  NA 137  K 5.0  CL 106  CO2 20*  BUN 19  CREATININE 1.03  GLUCOSE 154*    Electrolytes Recent Labs  Lab 2017/09/16 0959  CALCIUM 9.5    CBC Recent Labs  Lab 2017/09/16 0959  WBC 8.1  HGB 11.9*  HCT 36.1*  PLT 269    Coag's No results for input(s): APTT, INR in the last 168 hours.  Sepsis Markers Recent Labs  Lab 2017/09/16 1004  LATICACIDVEN 1.57    ABG Recent Labs  Lab 2017/09/16 1126  PHART 7.309*  PCO2ART 38.2  PO2ART 73.9*    Liver Enzymes Recent Labs  Lab 2017/09/16 0959  AST 22  ALT 29  ALKPHOS 117  BILITOT 1.0  ALBUMIN 3.6    Cardiac Enzymes No results for input(s): TROPONINI, PROBNP in the last 168 hours.  Glucose No results for input(s): GLUCAP in the last 168 hours.  Imaging Dg Chest 2 View  Result Date: 09/03/2017 CLINICAL DATA:  Shortness of breath. EXAM: CHEST  2 VIEW COMPARISON:  Radiograph of August 11, 2014. FINDINGS: Probable cardiomegaly is noted with central pulmonary  vascular congestion. Hypoinflation of the lungs is noted with probable bibasilar subsegmental atelectasis. Bilateral perihilar edema cannot be excluded. Mild bilateral pleural effusions are noted. Bony thorax is unremarkable. No pneumothorax is noted. IMPRESSION: Probable cardiomegaly with central pulmonary vascular congestion and bilateral pulmonary edema. Mild bilateral pleural effusions are noted. Hypoinflation of the lungs is noted with probable bibasilar subsegmental atelectasis. Electronically Signed   By: Lupita RaiderJames  Green Jr, M.D.   On: 002/18/2019 10:44   Ct Angio Chest Pe W And/or Wo Contrast  Result Date: 09/03/2017 CLINICAL DATA:  Ongoing cough for a week. Short of breath. Abdominal distention. Patient difficulty maintaining stool position on CT scanner. Patient intubated after scan complete. EXAM: CT ANGIOGRAPHY CHEST CT ABDOMEN AND PELVIS WITH CONTRAST TECHNIQUE: Multidetector CT imaging of the chest was performed using the standard protocol during bolus administration of intravenous contrast. Multiplanar CT image reconstructions and MIPs were obtained to evaluate the vascular anatomy. Multidetector CT imaging of the abdomen and pelvis was performed using the standard protocol during bolus administration  of intravenous contrast. CONTRAST:  100 mL Isovue. COMPARISON:  None. FINDINGS: CTA CHEST FINDINGS Cardiovascular: Exam is limited by patient body motion and respiratory motion. Additionally arms moving over chest. No filling defects within the proximal pulmonary arteries. The aortic arch is not pass 5 but no acute findings are identified. Coronary artery calcification and aortic atherosclerotic calcification. No pericardial effusion. Mediastinum/Nodes: No axillary supraclavicular adenopathy. No mediastinal hilar adenopathy. No pericardial effusion Lungs/Pleura: . bilateral moderate pleural effusions. Mild bibasilar atelectasis. No pulmonary infarction or infiltrate identified. No pneumothorax.  Musculoskeletal: No fracture Review of the MIP images confirms the above findings. CT ABDOMEN and PELVIS FINDINGS Severe body motion degrades imaging. Hepatobiliary: No focal hepatic lesion. No biliary duct dilatation. Gallbladder is normal. Common bile duct is normal. Pancreas: Pancreas is normal. No ductal dilatation. No pancreatic inflammation. Spleen: Normal spleen Adrenals/urinary tract: Adrenal glands and kidneys are normal. The ureters and bladder normal. Stomach/Bowel: Stomach, small bowel, appendix, and cecum are normal. The colon and rectosigmoid colon are normal. Vascular/Lymphatic: Abdominal aorta is normal caliber with atherosclerotic calcification. There is no retroperitoneal or periportal lymphadenopathy. No pelvic lymphadenopathy. Reproductive: Prostate normal Other: No free fluid. Musculoskeletal: No aggressive osseous lesion. Review of the MIP images confirms the above findings. IMPRESSION: 1. Exam is limited by patient body motion and respiratory motion. 2. No evidence acute pulmonary embolism. 3. Bilateral moderate size pleural effusions and associated basilar atelectasis. 4. No acute findings in the abdomen pelvis. 5.  Coronary artery and aortic Atherosclerosis (ICD10-I70.0). Electronically Signed   By: Genevive Bi M.D.   On: 2017/09/15 12:54   Ct Abdomen Pelvis W Contrast  Result Date: September 15, 2017 CLINICAL DATA:  Ongoing cough for a week. Short of breath. Abdominal distention. Patient difficulty maintaining stool position on CT scanner. Patient intubated after scan complete. EXAM: CT ANGIOGRAPHY CHEST CT ABDOMEN AND PELVIS WITH CONTRAST TECHNIQUE: Multidetector CT imaging of the chest was performed using the standard protocol during bolus administration of intravenous contrast. Multiplanar CT image reconstructions and MIPs were obtained to evaluate the vascular anatomy. Multidetector CT imaging of the abdomen and pelvis was performed using the standard protocol during bolus  administration of intravenous contrast. CONTRAST:  100 mL Isovue. COMPARISON:  None. FINDINGS: CTA CHEST FINDINGS Cardiovascular: Exam is limited by patient body motion and respiratory motion. Additionally arms moving over chest. No filling defects within the proximal pulmonary arteries. The aortic arch is not pass 5 but no acute findings are identified. Coronary artery calcification and aortic atherosclerotic calcification. No pericardial effusion. Mediastinum/Nodes: No axillary supraclavicular adenopathy. No mediastinal hilar adenopathy. No pericardial effusion Lungs/Pleura: . bilateral moderate pleural effusions. Mild bibasilar atelectasis. No pulmonary infarction or infiltrate identified. No pneumothorax. Musculoskeletal: No fracture Review of the MIP images confirms the above findings. CT ABDOMEN and PELVIS FINDINGS Severe body motion degrades imaging. Hepatobiliary: No focal hepatic lesion. No biliary duct dilatation. Gallbladder is normal. Common bile duct is normal. Pancreas: Pancreas is normal. No ductal dilatation. No pancreatic inflammation. Spleen: Normal spleen Adrenals/urinary tract: Adrenal glands and kidneys are normal. The ureters and bladder normal. Stomach/Bowel: Stomach, small bowel, appendix, and cecum are normal. The colon and rectosigmoid colon are normal. Vascular/Lymphatic: Abdominal aorta is normal caliber with atherosclerotic calcification. There is no retroperitoneal or periportal lymphadenopathy. No pelvic lymphadenopathy. Reproductive: Prostate normal Other: No free fluid. Musculoskeletal: No aggressive osseous lesion. Review of the MIP images confirms the above findings. IMPRESSION: 1. Exam is limited by patient body motion and respiratory motion. 2. No evidence acute pulmonary embolism. 3. Bilateral  moderate size pleural effusions and associated basilar atelectasis. 4. No acute findings in the abdomen pelvis. 5.  Coronary artery and aortic Atherosclerosis (ICD10-I70.0). Electronically  Signed   By: Genevive Bi M.D.   On: 08/31/2017 12:54   Dg Chest Port 1 View  Result Date: 08/19/2017 CLINICAL DATA:  Cyanosis. EXAM: PORTABLE CHEST 1 VIEW COMPARISON:  Earlier the same day FINDINGS: Interval intubation with endotracheal tube tip approximately 3 cm above the base of the carina. The NG tube passes into the stomach although the distal tip position is not included on the film. Lung volumes are low. The cardio pericardial silhouette is enlarged. Interval progression of bilateral parahilar opacity suggests edema. Small bilateral pleural effusions again noted. The visualized bony structures of the thorax are intact. Telemetry leads overlie the chest. IMPRESSION: 1. Interval intubation with appropriate position of endotracheal tube by x-ray. 2. Lower lung volumes with progressive pulmonary edema pattern and small bilateral pleural effusions. Electronically Signed   By: Kennith Center M.D.   On: 08/25/2017 13:01     STUDIES:  February 21 CT chest abdomen pelvis with contrast images independently reviewed: No pulmonary embolism, bilateral pleural effusions, no acute abdominal process February 21 echocardiogram  CULTURES: February 21 respiratory panel positive influenza A   ANTIBIOTICS: February 21 Tamiflu  SIGNIFICANT EVENTS:   LINES/TUBES: February 21 endotracheal tube  DISCUSSION: 57 year old male with a past medical history significant for diabetes mellitus was admitted on February 21 with acute respiratory failure with hypoxemia in the setting of influenza A.  On chest imaging he has evidence of congestive heart failure as well.  ASSESSMENT / PLAN:  PULMONARY A: Acute respiratory failure with hypoxemia Bilateral pleural effusions Likely acute pulmonary edema  P:   Full mechanical ventilatory support Ventilator associated pneumonia prevention protocol Daily wakeup assessment Daily spontaneous breathing trial As needed bronchodilators  CARDIOVASCULAR A:   Likely congestive heart failure Heart murmur P:  Echocardiogram Monitor hemodynamics Would likely benefit from Lasix, will evaluate blood pressure after administering sedation to see if we can give Lasix this evening   RENAL A:   No acute issues P:   Monitor BMET and UOP Replace electrolytes as needed   GASTROINTESTINAL A:   Mild abdominal distention, CT process P:   Start tube feeding Pepcid for stress ulcer prophylaxis  HEMATOLOGIC A:   Anemia without bleeding, known B12 def P:  Monitor for bleeding CBC prn  INFECTIOUS A:   Influenza A pneumonia P:   Tamiflu  ENDOCRINE A:   Hyperglycemia P:   Sliding scale insulin  NEUROLOGIC A:   Sedation need for ventilator synchrony P:   RASS goal: -2 Propofol per PAD protocol Fentanyl and Versed as needed per PAD protocol   FAMILY  - Updates: none bedside  - Inter-disciplinary family meet or Palliative Care meeting due by:  day 7  My cc time 35 minutes  Heber [place], MD [facility] Pager: [phone] Cell: [phone] After 3pm or if no response, call [phone]    08/19/2017, 2:10 PM

## 2017-09-06 NOTE — Progress Notes (Signed)
eLink Physician-Brief Progress Note Patient Name: Stephen SprinklesJeffrey W Fitzgerald DOB: [birth year] MRN: [id number]   Date of Service  08/22/2017  HPI/Events of Note    eICU Interventions  Lovenox for VTE Px     Intervention Category Minor Interventions: Other:  Wilber Oliphantmar Franklyn Cafaro 09/09/2017, 10:34 PM

## 2017-09-06 NOTE — ED Provider Notes (Signed)
[facility] DEPT Provider Note   CSN: [id number] Arrival date & time: 09/03/2017  5638     History   Chief Complaint Chief Complaint  Patient presents with  . Cough    HPI TIRAS BIANCHINI is a 57 y.o. male history of diabetes, hypertension, here presenting with shortness of breath, cough, chills, abdominal pain.  Patient states that for the last 2 weeks, he has pleuritic chest pain that is worse with exertion and worse with movement.  He also had chills and nonproductive cough as well.  Patient states that he also has progressive abdominal distention and lower abdominal pain as well.  Patient denies any urinary symptoms.  Patient did travel to [place] recently but denies any long car rides.  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  He had previous stroke with residual right-sided weakness that is not getting worse.  Patient denies any history of blood clots.   The history is provided by the patient.    Past Medical History:  Diagnosis Date  . Abscess of hand, left 12/08/2012   left hand with only 3rd to 5th finger, 4th finger to midjoint.- reddened but dry and intact scar line.  . 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 Arthritis    ankles & shoulders   . Bacteremia   . Blood poisoning   . Diabetes mellitus without complication (HCC)    not usually checking blood sugars daily.  . Flexor tenosynovitis of finger 12/08/2012  . Heart murmur   . Hypertension   . Stroke [facility])    expressive aphasia, R sided weakness   . Toe osteomyelitis, right (HCC)    right second toe  . Vitamin B12 deficiency 08/18/2014    Patient Active Problem List   Diagnosis Date Noted  . Influenza A 08/23/2017  . S/P transmetatarsal amputation of foot, left ([place]) 10/20/2016  . Healthcare maintenance 04/27/2015  . History of osteomyelitis 08/26/2014  . Heart murmur, systolic   .  Hyperlipidemia 03/06/2013  . Hypertension 03/06/2013  . Diabetes mellitus type 2 with complications, uncontrolled ([place]) 12/08/2012  . Expressive aphasia syndrome 12/08/2012  . 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 ABDOMINAL AORTAGRAM N/A 08/13/2014   Procedure: ABDOMINAL Maxcine Ham;  Surgeon: Conrad Bull [place], MD;  Location: [facility] CATH LAB;  Service: Cardiovascular;  Laterality: N/A;  . AMPUTATION Left 12/06/2012   Procedure: AMPUTATION DIGIT LEFT INDEX;  Surgeon: Jolyn Nap, MD;  Location: [place];  Service: Orthopedics;  Laterality: Left;  . AMPUTATION Left 08/15/2014   Procedure: AMPUTATION LEFT RING FINGER;  Surgeon: Mcarthur Rossetti, MD;  Location: [facility];  Service: Orthopedics;  Laterality: Left;  . AMPUTATION Right 08/15/2014   Procedure: AMPUTATION RIGHT FOOT FIRST RAY AMPUTATION AND IRRIGATION AND [place] OF RIGHT FOOT ABSCESS;  Surgeon: Mcarthur Rossetti, MD;  Location: [place];  Service: Orthopedics;  Laterality: Right;  . AMPUTATION Right 12/10/2015   Procedure: AMPUTATION RIGHT 2ND RAY;  Surgeon: Mcarthur Rossetti, MD;  Location: [facility];  Service: Orthopedics;  Laterality: Right;  . AMPUTATION Left 08/24/2016   Procedure: LEFT GREAT TOE AMPUTATION;  Surgeon: Mcarthur Rossetti, MD;  Location: [facility];  Service: Orthopedics;  Laterality: Left;  . AMPUTATION Left 10/20/2016   Procedure: Left Transmetatarsal Amputation, Application of Wound VAC;  Surgeon: Newt Minion, MD;  Location: [place];  Service: Orthopedics;  Laterality: Left;  . APPLICATION OF WOUND VAC Left 10/20/2016   foot  . CATARACT EXTRACTION W/ INTRAOCULAR LENS  IMPLANT, BILATERAL Bilateral   . I&D EXTREMITY Left 12/04/2012   Procedure: IRRIGATION AND DEBRIDEMENT Left Hand with Ring Removal  times three, Carpal Tunnel , Flexor tendon Synovectomy;  Surgeon: Jolyn Nap, MD;  Location: [place];  Service: Orthopedics;  Laterality: Left;  .  I&D EXTREMITY Left 12/06/2012   Procedure: IRRIGATION AND DEBRIDEMENT EXTREMITY LEFT HAND;  Surgeon: Jolyn Nap, MD;  Location: [place];  Service: Orthopedics;  Laterality: Left;  . I&D EXTREMITY Left 10/03/2016   Procedure: IRRIGATION AND DEBRIDEMENT LEFT 1ST METATARSAL with Wound Closure;  Surgeon: Mcarthur Rossetti, MD;  Location: [place];  Service: Orthopedics;  Laterality: Left;  . INGUINAL HERNIA REPAIR Bilateral 1964  . TENDON REPAIR Left 12/23/2012   Procedure: DIVISION AND INSETTING FLAP TO LEFT RING FINGER ADJACENT TISSUE REARRANGEMENT LEFT INDEX FINGER AMPUTATION SITE;  Surgeon: Jolyn Nap, MD;  Location: [place];  Service: Orthopedics;  Laterality: Left;  . TONSILLECTOMY    . TRANSMETATARSAL AMPUTATION Left 10/20/2016    Transmetatarsal Amputation, Application of Wound VAC       Home Medications    Prior to Admission medications   Medication Sig Start Date End Date Taking? Authorizing Provider  aspirin 81 MG tablet Take 1 tablet (81 mg total) by mouth daily. 08/10/15  Yes Haney, Alyssa A, MD  aspirin-sod bicarb-citric acid (ALKA-SELTZER) 325 MG TBEF tablet Take 325 mg by mouth every 6 (six) hours as needed.   Yes [provider]  atorvastatin (LIPITOR) 40 MG tablet TAKE 1 TABLET BY MOUTH DAILY 08/08/17  Yes Enid Derry, [place], DO  Cyanocobalamin (B-12) 2000 MCG TABS Take 2,000 mcg by mouth daily.    Yes [provider]  glipiZIDE (GLUCOTROL) 5 MG tablet Take 1 tablet (5 mg total) by mouth 2 (two) times daily before a meal. 08/08/17  Yes Enid Derry, [place], DO  metFORMIN (GLUCOPHAGE-XR) 500 MG 24 hr tablet TAKE 2 TABLETS BY MOUTH TWICE A DAY WITH MEALS. 08/08/17  Yes Enid Derry, [place], DO  metoprolol succinate (TOPROL-XL) 50 MG 24 hr tablet Take 1 tablet (50 mg total) by mouth daily. Take with or immediately following a meal. 08/08/17  Yes Enid Derry, [place], DO  Multiple Vitamins-Minerals (CENTRUM SILVER 50+MEN PO) Take by mouth.   Yes [provider]  Naphazoline-Glycerin (CLEAR EYES MAX REDNESS RELIEF OP) Apply 2 drops to eye daily as needed (dry eyes).    Yes [provider]  naproxen sodium (ANAPROX) 220 MG tablet Take 220 mg by mouth 2 (two) times daily as needed (pain).   Yes [provider]  silver sulfADIAZINE (SILVADENE) 1 % cream Apply 1 application topically daily. Patient not taking: Reported on 05/08/2017 11/13/16   Suzan Slick, NP    Family History No family history on file.  Social History Social History   Tobacco Use  . Smoking status: Never Smoker  . Smokeless tobacco: Never Used  Substance Use Topics  . Alcohol use: Yes    Alcohol/week: 33.6 oz    Types: 56 Cans of beer per week    Comment: 8-12 beers daily  . Drug use: No     Allergies   Unasyn [ampicillin-sulbactam sodium]   Review of Systems Review of Systems  Respiratory: Positive for cough.   All other systems reviewed and are negative.    Physical Exam Updated  Vital Signs BP 114/82   Pulse (!) 112   Temp 98.7 F (37.1 C) (Oral)   Resp 16   Ht 6' 1"  (1.854 m)   Wt 99.8 kg (220 lb)   SpO2 100%   BMI 29.03 kg/m   Physical Exam  Constitutional: He is oriented to person, place, and time. He appears well-developed.  Chronically ill, slightly dehydrated   HENT:  Head: Normocephalic.  MM slightly dry   Eyes: Conjunctivae and EOM are normal. Pupils are equal, round, and reactive to light.  Neck: Normal range of motion. Neck supple.  Cardiovascular: Regular rhythm and normal heart sounds.  Tachycardic   Pulmonary/Chest:  Diminished bilateral bases   Abdominal: Soft.  Distended, mild diffuse lower abdominal tenderness, no rebound   Musculoskeletal:  L mid foot amputation. No obvious calf tenderness bilaterally   Neurological: He is alert and oriented to person, place, and time.  Skin: Skin is warm.  Psychiatric: He has a normal mood and affect.  Nursing note and vitals reviewed.    ED Treatments  / Results  Labs (all labs ordered are listed, but only abnormal results are displayed) Labs Reviewed  COMPREHENSIVE METABOLIC PANEL - Abnormal; Notable for the following components:      Result Value   CO2 20 (*)    Glucose, Bld 154 (*)    All other components within normal limits  CBC WITH DIFFERENTIAL/PLATELET - Abnormal; Notable for the following components:   RBC 3.96 (*)    Hemoglobin 11.9 (*)    HCT 36.1 (*)    All other components within normal limits  URINALYSIS, ROUTINE W REFLEX MICROSCOPIC - Abnormal; Notable for the following components:   APPearance HAZY (*)    Ketones, ur 5 (*)    Protein, ur 30 (*)    All other components within normal limits  INFLUENZA PANEL BY PCR (TYPE A & B) - Abnormal; Notable for the following components:   Influenza A By PCR POSITIVE (*)    All other components within normal limits  BLOOD GAS, ARTERIAL - Abnormal; Notable for the following components:   pH, Arterial 7.309 (*)    pO2, Arterial 73.9 (*)    Bicarbonate 18.6 (*)    Acid-base deficit 6.6 (*)    All other components within normal limits  BRAIN NATRIURETIC PEPTIDE - Abnormal; Notable for the following components:   B Natriuretic Peptide 868.1 (*)    All other components within normal limits  BLOOD GAS, ARTERIAL - Abnormal; Notable for the following components:   pH, Arterial 7.230 (*)    pO2, Arterial 218 (*)    Bicarbonate 17.8 (*)    Acid-base deficit 9.0 (*)    All other components within normal limits  CULTURE, BLOOD (ROUTINE X 2)  CULTURE, BLOOD (ROUTINE X 2)  URINE CULTURE  HIV ANTIBODY (ROUTINE TESTING)  BRAIN NATRIURETIC PEPTIDE  LEGIONELLA PNEUMOPHILA SEROGP 1 UR AG  STREP PNEUMONIAE URINARY ANTIGEN  MAGNESIUM  PHOSPHORUS  TROPONIN I  TRIGLYCERIDES  CREATININE, SERUM  I-STAT CG4 LACTIC ACID, ED  I-STAT TROPONIN, ED    EKG  EKG Interpretation  Date/Time:  Thursday September 06 2017 09:36:37 EST Ventricular Rate:  123 PR Interval:    QRS Duration: 89 QT  Interval:  340 QTC Calculation: 487 R Axis:   26 Text Interpretation:  Sinus tachycardia Repol abnrm suggests ischemia, anterolateral rate faster since previous, TWI new since previous  Confirmed by Wandra Arthurs [phone]) on 09/11/2017 10:25:52 AM  Radiology Dg Chest 2 View  Result Date: 09/10/2017 CLINICAL DATA:  Shortness of breath. EXAM: CHEST  2 VIEW COMPARISON:  Radiograph of August 11, 2014. FINDINGS: Probable cardiomegaly is noted with central pulmonary vascular congestion. Hypoinflation of the lungs is noted with probable bibasilar subsegmental atelectasis. Bilateral perihilar edema cannot be excluded. Mild bilateral pleural effusions are noted. Bony thorax is unremarkable. No pneumothorax is noted. IMPRESSION: Probable cardiomegaly with central pulmonary vascular congestion and bilateral pulmonary edema. Mild bilateral pleural effusions are noted. Hypoinflation of the lungs is noted with probable bibasilar subsegmental atelectasis. Electronically Signed   By: Marijo Conception, M.D.   On: 08/30/2017 10:44   Ct Angio Chest Pe W And/or Wo Contrast  Result Date: 08/29/2017 CLINICAL DATA:  Ongoing cough for a week. Short of breath. Abdominal distention. Patient difficulty maintaining stool position on CT scanner. Patient intubated after scan complete. EXAM: CT ANGIOGRAPHY CHEST CT ABDOMEN AND PELVIS WITH CONTRAST TECHNIQUE: Multidetector CT imaging of the chest was performed using the standard protocol during bolus administration of intravenous contrast. Multiplanar CT image reconstructions and MIPs were obtained to evaluate the vascular anatomy. Multidetector CT imaging of the abdomen and pelvis was performed using the standard protocol during bolus administration of intravenous contrast. CONTRAST:  100 mL Isovue. COMPARISON:  None. FINDINGS: CTA CHEST FINDINGS Cardiovascular: Exam is limited by patient body motion and respiratory motion. Additionally arms moving over chest. No filling defects  within the proximal pulmonary arteries. The aortic arch is not pass 5 but no acute findings are identified. Coronary artery calcification and aortic atherosclerotic calcification. No pericardial effusion. Mediastinum/Nodes: No axillary supraclavicular adenopathy. No mediastinal hilar adenopathy. No pericardial effusion Lungs/Pleura: . bilateral moderate pleural effusions. Mild bibasilar atelectasis. No pulmonary infarction or infiltrate identified. No pneumothorax. Musculoskeletal: No fracture Review of the MIP images confirms the above findings. CT ABDOMEN and PELVIS FINDINGS Severe body motion degrades imaging. Hepatobiliary: No focal hepatic lesion. No biliary duct dilatation. Gallbladder is normal. Common bile duct is normal. Pancreas: Pancreas is normal. No ductal dilatation. No pancreatic inflammation. Spleen: Normal spleen Adrenals/urinary tract: Adrenal glands and kidneys are normal. The ureters and bladder normal. Stomach/Bowel: Stomach, small bowel, appendix, and cecum are normal. The colon and rectosigmoid colon are normal. Vascular/Lymphatic: Abdominal aorta is normal caliber with atherosclerotic calcification. There is no retroperitoneal or periportal lymphadenopathy. No pelvic lymphadenopathy. Reproductive: Prostate normal Other: No free fluid. Musculoskeletal: No aggressive osseous lesion. Review of the MIP images confirms the above findings. IMPRESSION: 1. Exam is limited by patient body motion and respiratory motion. 2. No evidence acute pulmonary embolism. 3. Bilateral moderate size pleural effusions and associated basilar atelectasis. 4. No acute findings in the abdomen pelvis. 5.  Coronary artery and aortic Atherosclerosis (ICD10-I70.0). Electronically Signed   By: Suzy Bouchard M.D.   On: 09/13/2017 12:54   Ct Abdomen Pelvis W Contrast  Result Date: 09/05/2017 CLINICAL DATA:  Ongoing cough for a week. Short of breath. Abdominal distention. Patient difficulty maintaining stool position on  CT scanner. Patient intubated after scan complete. EXAM: CT ANGIOGRAPHY CHEST CT ABDOMEN AND PELVIS WITH CONTRAST TECHNIQUE: Multidetector CT imaging of the chest was performed using the standard protocol during bolus administration of intravenous contrast. Multiplanar CT image reconstructions and MIPs were obtained to evaluate the vascular anatomy. Multidetector CT imaging of the abdomen and pelvis was performed using the standard protocol during bolus administration of intravenous contrast. CONTRAST:  100 mL Isovue. COMPARISON:  None. FINDINGS: CTA CHEST FINDINGS Cardiovascular: Exam is limited by patient  body motion and respiratory motion. Additionally arms moving over chest. No filling defects within the proximal pulmonary arteries. The aortic arch is not pass 5 but no acute findings are identified. Coronary artery calcification and aortic atherosclerotic calcification. No pericardial effusion. Mediastinum/Nodes: No axillary supraclavicular adenopathy. No mediastinal hilar adenopathy. No pericardial effusion Lungs/Pleura: . bilateral moderate pleural effusions. Mild bibasilar atelectasis. No pulmonary infarction or infiltrate identified. No pneumothorax. Musculoskeletal: No fracture Review of the MIP images confirms the above findings. CT ABDOMEN and PELVIS FINDINGS Severe body motion degrades imaging. Hepatobiliary: No focal hepatic lesion. No biliary duct dilatation. Gallbladder is normal. Common bile duct is normal. Pancreas: Pancreas is normal. No ductal dilatation. No pancreatic inflammation. Spleen: Normal spleen Adrenals/urinary tract: Adrenal glands and kidneys are normal. The ureters and bladder normal. Stomach/Bowel: Stomach, small bowel, appendix, and cecum are normal. The colon and rectosigmoid colon are normal. Vascular/Lymphatic: Abdominal aorta is normal caliber with atherosclerotic calcification. There is no retroperitoneal or periportal lymphadenopathy. No pelvic lymphadenopathy. Reproductive:  Prostate normal Other: No free fluid. Musculoskeletal: No aggressive osseous lesion. Review of the MIP images confirms the above findings. IMPRESSION: 1. Exam is limited by patient body motion and respiratory motion. 2. No evidence acute pulmonary embolism. 3. Bilateral moderate size pleural effusions and associated basilar atelectasis. 4. No acute findings in the abdomen pelvis. 5.  Coronary artery and aortic Atherosclerosis (ICD10-I70.0). Electronically Signed   By: Suzy Bouchard M.D.   On: 08/23/2017 12:54   Dg Chest Port 1 View  Result Date: 09/04/2017 CLINICAL DATA:  Cyanosis. EXAM: PORTABLE CHEST 1 VIEW COMPARISON:  Earlier the same day FINDINGS: Interval intubation with endotracheal tube tip approximately 3 cm above the base of the carina. The NG tube passes into the stomach although the distal tip position is not included on the film. Lung volumes are low. The cardio pericardial silhouette is enlarged. Interval progression of bilateral parahilar opacity suggests edema. Small bilateral pleural effusions again noted. The visualized bony structures of the thorax are intact. Telemetry leads overlie the chest. IMPRESSION: 1. Interval intubation with appropriate position of endotracheal tube by x-ray. 2. Lower lung volumes with progressive pulmonary edema pattern and small bilateral pleural effusions. Electronically Signed   By: Misty Stanley M.D.   On: 08/26/2017 13:01    Procedures OG placement Date/Time: 09/13/2017 2:41 PM Performed by: Drenda Freeze, MD Authorized by: Drenda Freeze, MD  Consent: Verbal consent obtained. Risks and benefits: risks, benefits and alternatives were discussed Consent given by: patient Patient understanding: patient states understanding of the procedure being performed Patient consent: the patient's understanding of the procedure matches consent given Procedure consent: procedure consent matches procedure scheduled Relevant documents: relevant documents  present and verified Patient identity confirmed: verbally with patient Time out: Immediately prior to procedure a "time out" was called to verify the correct patient, procedure, equipment, support staff and site/side marked as required. Local anesthesia used: no  Anesthesia: Local anesthesia used: no  Sedation: Patient sedated: yes Sedatives: etomidate  Patient tolerance: Patient tolerated the procedure well with no immediate complications    (including critical care time)   CRITICAL CARE Performed by: Wandra Arthurs   Total critical care time: 40 minutes  Critical care time was exclusive of separately billable procedures and treating other patients.  Critical care was necessary to treat or prevent imminent or life-threatening deterioration.  Critical care was time spent personally by me on the following activities: development of treatment plan with patient and/or surrogate as well as nursing,  discussions with consultants, evaluation of patient's response to treatment, examination of patient, obtaining history from patient or surrogate, ordering and performing treatments and interventions, ordering and review of laboratory studies, ordering and review of radiographic studies, pulse oximetry and re-evaluation of patient's condition.  INTUBATION Performed by: Wandra Arthurs  Required items: required blood products, implants, devices, and special equipment available Patient identity confirmed: provided demographic data and hospital-assigned identification number Time out: Immediately prior to procedure a "time out" was called to verify the correct patient, procedure, equipment, support staff and site/side marked as required.  Indications: hypoxia   Intubation method: Glidescope Laryngoscopy   Preoxygenation: BVM  Sedatives: 20 mg Etomidate Paralytic: 100 mgSuccinylcholine  Tube Size: 8  cuffed  Post-procedure assessment: chest rise and ETCO2 monitor Breath sounds: equal and  absent over the epigastrium Tube secured with: ETT holder Chest x-ray interpreted by radiologist and me.  Chest x-ray findings: endotracheal tube in appropriate position  Patient tolerated the procedure well with no immediate complications.     Medications Ordered in ED Medications  sodium chloride 0.9 % injection (not administered)  iopamidol (ISOVUE-370) 76 % injection (not administered)  fentaNYL 2532mg in NS 2598m(1060mml) infusion-PREMIX (100 mcg/hr Intravenous New Bag/Given 08/18/2017 1432)  0.9 %  sodium chloride infusion (not administered)  acetaminophen (TYLENOL) tablet 650 mg (not administered)  ondansetron (ZOFRAN) injection 4 mg (not administered)  famotidine (PEPCID) IVPB 20 mg in NS 100 mL IVPB (not administered)  albuterol (PROVENTIL) (2.5 MG/3ML) 0.083% nebulizer solution 2.5 mg (not administered)  chlorhexidine gluconate (MEDLINE KIT) (PERIDEX) 0.12 % solution 15 mL (not administered)  MEDLINE mouth rinse (not administered)  propofol (DIPRIVAN) 1000 MG/100ML infusion (not administered)  fentaNYL (SUBLIMAZE) injection 100 mcg (not administered)  fentaNYL (SUBLIMAZE) injection 100 mcg (not administered)  midazolam (VERSED) injection 2 mg (not administered)  midazolam (VERSED) injection 2 mg (not administered)  docusate (COLACE) 50 MG/5ML liquid 100 mg (not administered)  feeding supplement (VITAL HIGH PROTEIN) liquid 1,000 mL (not administered)  feeding supplement (PRO-STAT SUGAR FREE 64) liquid 30 mL (not administered)  insulin aspart (novoLOG) injection 0-15 Units (not administered)  oseltamivir (TAMIFLU) 6 MG/ML suspension 75 mg (not administered)  sodium chloride 0.9 % bolus 1,000 mL (0 mLs Intravenous Stopped 09/05/2017 1100)  albuterol (PROVENTIL) (2.5 MG/3ML) 0.083% nebulizer solution 5 mg (5 mg Nebulization Given 08/22/2017 1108)  ipratropium (ATROVENT) nebulizer solution 0.5 mg (0.5 mg Nebulization Given 09/11/2017 1108)  methylPREDNISolone sodium succinate  (SOLU-MEDROL) 125 mg/2 mL injection 125 mg (125 mg Intravenous Given 08/31/2017 1122)  iopamidol (ISOVUE-370) 76 % injection 100 mL (100 mLs Intravenous Contrast Given 08/21/2017 1209)  sodium chloride 0.9 % bolus 1,000 mL (1,000 mLs Intravenous New Bag/Given 08/17/2017 1122)  ondansetron (ZOFRAN) injection 4 mg (4 mg Intravenous Given 08/18/2017 1122)  magnesium sulfate IVPB 2 g 50 mL (0 g Intravenous Stopped 09/02/2017 1238)  LORazepam (ATIVAN) injection 1 mg (1 mg Intravenous Given 09/12/2017 1159)  furosemide (LASIX) injection 80 mg (80 mg Intravenous Given 09/11/2017 1337)  etomidate (AMIDATE) injection (20 mg Intravenous Given 09/08/2017 1243)  succinylcholine (ANECTINE) injection (100 mg Intravenous Given 08/23/2017 1244)  midazolam (VERSED) injection 4 mg (4 mg Intravenous Given 09/05/2017 1304)  fentaNYL (SUBLIMAZE) injection 100 mcg (100 mcg Intravenous Given 09/08/2017 1304)  midazolam (VERSED) injection 2 mg (2 mg Intravenous Given 08/25/2017 1403)     Initial Impression / Assessment and Plan / ED Course  I have reviewed the triage vital signs and the nursing notes.  Pertinent labs & imaging  results that were available during my care of the patient were reviewed by me and considered in my medical decision making (see chart for details).     MARLOS CARMEN is a 57 y.o. male here with cough, SOB, tachycardia, abdominal pain. Differential is broad- consider infectious etiologies (flu, pneumonia, colitis), PE, dehydration, hyperglycemia. Will get labs, lactate, cultures, UA, CTA chest, CT ab/pel. Will hydrate and reassess.   11 AM Attempted to get CT but patient unable to lay flat. Patient became tachycardic to 150s and was more short of breath. Has wheezing on exam now. Ordered nebs, IVF, zofran, steroids, magnesium. ABG reassuring.   11:30 am Felt better. He is anxious. Will give ativan before CT.   12:20 pm Patient now hypoxic to 85% on nonrebreather. Very altered now and difficulty maintaining airway.  Given etomidate, succinylcholine. Will intubate.  1 pm Patient's ET tube is confirmed. I placed OG tube and it is in the stomach. Flu A positive. BNP 900. Will give lasix. I wonder if he had pulmonary edema from flu syndrome. No obvious PE on CT. Critical care to admit.   2:43 PM Critical care here to admit for hypoxia from flu, pulmonary edema.    Final Clinical Impressions(s) / ED Diagnoses   Final diagnoses:  None    ED Discharge Orders    None       Drenda Freeze, MD 08/30/2017 [phone]

## 2017-09-06 NOTE — ED Notes (Signed)
Patient transported to x-ray. ?

## 2017-09-07 ENCOUNTER — Inpatient Hospital Stay (HOSPITAL_COMMUNITY): Payer: Medicare Other

## 2017-09-07 DIAGNOSIS — I340 Nonrheumatic mitral (valve) insufficiency: Secondary | ICD-10-CM

## 2017-09-07 DIAGNOSIS — N179 Acute kidney failure, unspecified: Secondary | ICD-10-CM

## 2017-09-07 DIAGNOSIS — J181 Lobar pneumonia, unspecified organism: Secondary | ICD-10-CM

## 2017-09-07 LAB — GLUCOSE, CAPILLARY
GLUCOSE-CAPILLARY: 189 mg/dL — AB (ref 65–99)
GLUCOSE-CAPILLARY: 211 mg/dL — AB (ref 65–99)
GLUCOSE-CAPILLARY: 172 mg/dL — AB (ref 65–99)

## 2017-09-07 LAB — CBC
HCT: 32.8 % — ABNORMAL LOW (ref 39.0–52.0)
Hemoglobin: 10.6 g/dL — ABNORMAL LOW (ref 13.0–17.0)
MCH: 29.7 pg (ref 26.0–34.0)
MCHC: 32.3 g/dL (ref 30.0–36.0)
MCV: 91.9 fL (ref 78.0–100.0)
PLATELETS: 201 10*3/uL (ref 150–400)
RBC: 3.57 MIL/uL — ABNORMAL LOW (ref 4.22–5.81)
RDW: 14.5 % (ref 11.5–15.5)
WBC: 7.8 10*3/uL (ref 4.0–10.5)

## 2017-09-07 LAB — BASIC METABOLIC PANEL
Anion gap: 9 (ref 5–15)
BUN: 30 mg/dL — ABNORMAL HIGH (ref 6–20)
CALCIUM: 8 mg/dL — AB (ref 8.9–10.3)
CO2: 18 mmol/L — ABNORMAL LOW (ref 22–32)
CREATININE: 1.95 mg/dL — AB (ref 0.61–1.24)
Chloride: 109 mmol/L (ref 101–111)
GFR calc Af Amer: 43 mL/min — ABNORMAL LOW (ref >60)
GFR calc non Af Amer: 37 mL/min — ABNORMAL LOW (ref >60)
Glucose, Bld: 251 mg/dL — ABNORMAL HIGH (ref 65–99)
Potassium: 5.2 mmol/L — ABNORMAL HIGH (ref 3.5–5.1)
SODIUM: 136 mmol/L (ref 135–145)

## 2017-09-07 LAB — STREP PNEUMONIAE URINARY ANTIGEN: Strep Pneumo Urinary Antigen: NEGATIVE

## 2017-09-07 LAB — URINE CULTURE: CULTURE: NO GROWTH

## 2017-09-07 LAB — MAGNESIUM: MAGNESIUM: 1.9 mg/dL (ref 1.7–2.4)

## 2017-09-07 LAB — PHOSPHORUS: PHOSPHORUS: 5.3 mg/dL — AB (ref 2.5–4.6)

## 2017-09-07 LAB — CREATININE, URINE, RANDOM: CREATININE, URINE: 204.44 mg/dL

## 2017-09-07 LAB — ECHOCARDIOGRAM COMPLETE
HEIGHTINCHES: 73 in
WEIGHTICAEL: 3830.71 [oz_av]

## 2017-09-07 LAB — SODIUM, URINE, RANDOM: SODIUM UR: 10 mmol/L

## 2017-09-07 LAB — HIV ANTIBODY (ROUTINE TESTING W REFLEX): HIV Screen 4th Generation wRfx: NONREACTIVE

## 2017-09-07 MED ORDER — INSULIN ASPART 100 UNIT/ML ~~LOC~~ SOLN
0.0000 [IU] | SUBCUTANEOUS | Status: DC
  Administered 2017-09-07: 4 [IU] via SUBCUTANEOUS

## 2017-09-07 MED ORDER — PRO-STAT SUGAR FREE PO LIQD: 60.0000 mL | Freq: Three times a day (TID) | ORAL | Status: DC

## 2017-09-07 MED ORDER — SODIUM CHLORIDE 0.9 % IV BOLUS (SEPSIS)
500.0000 mL | Freq: Once | INTRAVENOUS | Status: AC
  Administered 2017-09-07: 500 mL via INTRAVENOUS

## 2017-09-07 MED ORDER — VITAL HIGH PROTEIN PO LIQD
1000.0000 mL | ORAL | Status: DC
  Filled 2017-09-07: qty 1000

## 2017-09-07 MED ORDER — SODIUM CHLORIDE 0.9 % IV BOLUS (SEPSIS)
250.0000 mL | Freq: Once | INTRAVENOUS | Status: AC
  Administered 2017-09-07: 250 mL via INTRAVENOUS

## 2017-09-07 MED ORDER — FREE WATER
50.0000 mL | Status: DC
  Administered 2017-09-07: 50 mL

## 2017-09-07 MED ORDER — SODIUM CHLORIDE 0.9 % IV SOLN
2.0000 g | INTRAVENOUS | Status: DC
  Administered 2017-09-07: 2 g via INTRAVENOUS
  Filled 2017-09-07: qty 2

## 2017-09-07 MED ORDER — SODIUM CHLORIDE 0.9 % IV SOLN
500.0000 mg | INTRAVENOUS | Status: DC
  Administered 2017-09-07: 500 mg via INTRAVENOUS
  Filled 2017-09-07: qty 500

## 2017-09-07 MED ORDER — AMIODARONE IV BOLUS ONLY 150 MG/100ML
INTRAVENOUS | Status: AC
  Filled 2017-09-07: qty 100

## 2017-09-07 MED FILL — Medication: Qty: 1 | Status: AC

## 2017-09-08 LAB — LEGIONELLA PNEUMOPHILA SEROGP 1 UR AG: L. pneumophila Serogp 1 Ur Ag: NEGATIVE

## 2017-09-10 LAB — CULTURE, RESPIRATORY W GRAM STAIN

## 2017-09-11 LAB — CULTURE, BLOOD (ROUTINE X 2)
CULTURE: NO GROWTH
Culture: NO GROWTH
Special Requests: ADEQUATE
Special Requests: ADEQUATE

## 2017-09-14 NOTE — Progress Notes (Signed)
Initial Nutrition Assessment  DOCUMENTATION CODES:   Obesity unspecified  INTERVENTION:  Will adjust TF regimen: Vital High Protein @ 35 mL/hr with 60 mL Prostat TID and 50 mL free water every 4 hours. This regimen will provide 1440 kcal, 163 grams of protein, 1002 mL free water, and 972 mg Phos.    NUTRITION DIAGNOSIS:   Inadequate oral intake related to inability to eat as evidenced by NPO status.  GOAL:   Provide needs based on ASPEN/SCCM guidelines  MONITOR:   Vent status, TF tolerance, Weight trends, Labs  REASON FOR ASSESSMENT:   Ventilator, Consult Enteral/tube feeding initiation and management  ASSESSMENT:   57 y/o male with diabetes mellitus admitted with influenza pneumonia causing acute respiratory failure with hypoxemia.  BMI indicates obesity. Pt is intubated with OGT in place. No family/visitors present to provide PTA information. Pt is currently receiving Vital High Protein @ 40 mL/hr with 30 mL Prostat BID. This regimen is providing 1160 kcal, 114 grams of protein, and 802 mL free water.   Per Dr. Ulyses JarredMcQuaid's note this AM: bilateral pleural effusions, likely acute pulmonary edema, severe CAP from flu, mild abdominal distention and CT negative.  Patient is currently intubated on ventilator support MV: 12 L/min Temp (24hrs), Avg:101 F (38.3 C), Min:99 F (37.2 C), Max:102 F (38.9 C) Propofol: none BP: 93/60 and MAP: 66  Medications reviewed; 20 mg IV Pepcid BID, 40 mg IV Lasix x1 dose yesterday, 80 mg IV Lasix x1 dose yesterday, sliding scale Novolog, 2 g IV Mg sulfate x1 run yesterday, 75 mg Tamiflu BID per OGT. Labs reviewed; CBGs: 189 and 211 mg/dL this AM, K: 5.2 mmol/L, BUN: 30 mg/dL, creatinine: 1.611.95 mg/dL, Ca: 8 mg/dL, Phos: 5.3 mg/dL (up from 4.7 mg/dL yesterday), GFR: 37 mL/min.  Drip: Fentanyl @ 200 mcg/hr.     NUTRITION - FOCUSED PHYSICAL EXAM:  Completed/assessed with no muscle and no fat wasting noted.   Diet Order:  Diet NPO time  specified  EDUCATION NEEDS:   No education needs have been identified at this time  Skin:  Skin Assessment: Reviewed RN Assessment  Last BM:  PTA/unknown  Height:   Ht Readings from Last 1 Encounters:  2018-01-04 6\' 1"  (1.854 m)    Weight:   Wt Readings from Last 1 Encounters:  08/28/2017 239 lb 6.7 oz (108.6 kg)    Ideal Body Weight:  83.64 kg  BMI:  Body mass index is 31.59 kg/m.  Estimated Nutritional Needs:   Kcal:  1195-1520 (11-14 kcal/kg)  Protein:  167 grams (2 grams/kg IBW)  Fluid:  >/= 2 L/day      Stephen GammonJessica Shontavia Mickel, MS, RD, LDN, [facility] Inpatient Clinical Dietitian Pager # [phone] After hours/weekend pager # [phone]

## 2017-09-14 NOTE — Progress Notes (Addendum)
PULMONARY / CRITICAL CARE MEDICINE   Name: Stephen Fitzgerald MRN: [id number] DOB: Jan 04, 1961    ADMISSION DATE:  10/05/17 CONSULTATION DATE: 10/05/17   REFERRING MD:  Dr. Silverio Lay  CHIEF COMPLAINT:  shortness of breath and abdominal pain  BRIEF:  57 y/o male with diabetes mellitus admitted with influenza pneumonia causing acute respiratory failure with hypoxemia.    SUBJECTIVE:  Renal function worse Hyperglycemic fever  VITAL SIGNS: BP (!) 116/92   Pulse (!) 111   Temp (!) 101.9 F (38.8 C) (Oral) Comment: reported to rn   Resp 20   Ht 6\' 1"  (1.854 m)   Wt 239 lb 6.7 oz (108.6 kg)   SpO2 100%   BMI 31.59 kg/m   HEMODYNAMICS:    VENTILATOR SETTINGS: Vent Mode: PRVC FiO2 (%):  [40 %-100 %] 40 % Set Rate:  [16 bmp-20 bmp] 20 bmp Vt Set:  [620 mL] 620 mL PEEP:  [5 cmH20] 5 cmH20 Plateau Pressure:  [22 cmH20-33 cmH20] 27 cmH20  INTAKE / OUTPUT: I/O last 3 completed shifts: In: 1623.4 [I.V.:743.4; NG/GT:80; IV Piggyback:800] Out: 1145 [Urine:1145]  PHYSICAL EXAMINATION:  General:  In bed on vent HENT: NCAT ETT in place PULM: CTA B, vent supported breathing CV: RRR, systolic murmur RUSB GI: BS+, soft, nontender MSK: normal bulk and tone Neuro: sedated on vent    LABS:  BMET Recent Labs  Lab Oct 05, 2017 0958 2017/10/05 0959 09/08/2017 0456  NA  --  137 136  K  --  5.0 5.2*  CL  --  106 109  CO2  --  20* 18*  BUN  --  19 30*  CREATININE 1.03 1.03 1.95*  GLUCOSE  --  154* 251*    Electrolytes Recent Labs  Lab October 05, 2017 0959 2017-10-05 1747 09/04/2017 0456  CALCIUM 9.5  --  8.0*  MG  --  1.9 1.9  PHOS  --  4.7* 5.3*    CBC Recent Labs  Lab 10/05/2017 0959 08/25/2017 0456  WBC 8.1 7.8  HGB 11.9* 10.6*  HCT 36.1* 32.8*  PLT 269 201    Coag's No results for input(s): APTT, INR in the last 168 hours.  Sepsis Markers Recent Labs  Lab 10-05-17 1004  LATICACIDVEN 1.57    ABG Recent Labs  Lab Oct 05, 2017 1126 2017-10-05 1405  PHART  7.309* 7.230*  PCO2ART 38.2 44.1  PO2ART 73.9* 218*    Liver Enzymes Recent Labs  Lab 10/05/2017 0959  AST 22  ALT 29  ALKPHOS 117  BILITOT 1.0  ALBUMIN 3.6    Cardiac Enzymes Recent Labs  Lab Oct 05, 2017 0958  TROPONINI 0.05*    Glucose Recent Labs  Lab 10/05/2017 1716 Oct 05, 2017 2002 10/05/2017 2348 08/19/2017 0401 09/04/2017 0836  GLUCAP 212* 197* 164* 189* 211*    Imaging Dg Chest 2 View  Result Date: October 05, 2017 CLINICAL DATA:  Shortness of breath. EXAM: CHEST  2 VIEW COMPARISON:  Radiograph of August 11, 2014. FINDINGS: Probable cardiomegaly is noted with central pulmonary vascular congestion. Hypoinflation of the lungs is noted with probable bibasilar subsegmental atelectasis. Bilateral perihilar edema cannot be excluded. Mild bilateral pleural effusions are noted. Bony thorax is unremarkable. No pneumothorax is noted. IMPRESSION: Probable cardiomegaly with central pulmonary vascular congestion and bilateral pulmonary edema. Mild bilateral pleural effusions are noted. Hypoinflation of the lungs is noted with probable bibasilar subsegmental atelectasis. Electronically Signed   By: Lupita Raider, M.D.   On: 05-Oct-2017 10:44   Ct Angio Chest Pe W And/or Wo Contrast  Result Date: 08/21/2017 CLINICAL DATA:  Ongoing cough for a week. Short of breath. Abdominal distention. Patient difficulty maintaining stool position on CT scanner. Patient intubated after scan complete. EXAM: CT ANGIOGRAPHY CHEST CT ABDOMEN AND PELVIS WITH CONTRAST TECHNIQUE: Multidetector CT imaging of the chest was performed using the standard protocol during bolus administration of intravenous contrast. Multiplanar CT image reconstructions and MIPs were obtained to evaluate the vascular anatomy. Multidetector CT imaging of the abdomen and pelvis was performed using the standard protocol during bolus administration of intravenous contrast. CONTRAST:  100 mL Isovue. COMPARISON:  None. FINDINGS: CTA CHEST FINDINGS  Cardiovascular: Exam is limited by patient body motion and respiratory motion. Additionally arms moving over chest. No filling defects within the proximal pulmonary arteries. The aortic arch is not pass 5 but no acute findings are identified. Coronary artery calcification and aortic atherosclerotic calcification. No pericardial effusion. Mediastinum/Nodes: No axillary supraclavicular adenopathy. No mediastinal hilar adenopathy. No pericardial effusion Lungs/Pleura: . bilateral moderate pleural effusions. Mild bibasilar atelectasis. No pulmonary infarction or infiltrate identified. No pneumothorax. Musculoskeletal: No fracture Review of the MIP images confirms the above findings. CT ABDOMEN and PELVIS FINDINGS Severe body motion degrades imaging. Hepatobiliary: No focal hepatic lesion. No biliary duct dilatation. Gallbladder is normal. Common bile duct is normal. Pancreas: Pancreas is normal. No ductal dilatation. No pancreatic inflammation. Spleen: Normal spleen Adrenals/urinary tract: Adrenal glands and kidneys are normal. The ureters and bladder normal. Stomach/Bowel: Stomach, small bowel, appendix, and cecum are normal. The colon and rectosigmoid colon are normal. Vascular/Lymphatic: Abdominal aorta is normal caliber with atherosclerotic calcification. There is no retroperitoneal or periportal lymphadenopathy. No pelvic lymphadenopathy. Reproductive: Prostate normal Other: No free fluid. Musculoskeletal: No aggressive osseous lesion. Review of the MIP images confirms the above findings. IMPRESSION: 1. Exam is limited by patient body motion and respiratory motion. 2. No evidence acute pulmonary embolism. 3. Bilateral moderate size pleural effusions and associated basilar atelectasis. 4. No acute findings in the abdomen pelvis. 5.  Coronary artery and aortic Atherosclerosis (ICD10-I70.0). Electronically Signed   By: Genevive Bi M.D.   On: 08/30/2017 12:54   Ct Abdomen Pelvis W Contrast  Result Date:  08/31/2017 CLINICAL DATA:  Ongoing cough for a week. Short of breath. Abdominal distention. Patient difficulty maintaining stool position on CT scanner. Patient intubated after scan complete. EXAM: CT ANGIOGRAPHY CHEST CT ABDOMEN AND PELVIS WITH CONTRAST TECHNIQUE: Multidetector CT imaging of the chest was performed using the standard protocol during bolus administration of intravenous contrast. Multiplanar CT image reconstructions and MIPs were obtained to evaluate the vascular anatomy. Multidetector CT imaging of the abdomen and pelvis was performed using the standard protocol during bolus administration of intravenous contrast. CONTRAST:  100 mL Isovue. COMPARISON:  None. FINDINGS: CTA CHEST FINDINGS Cardiovascular: Exam is limited by patient body motion and respiratory motion. Additionally arms moving over chest. No filling defects within the proximal pulmonary arteries. The aortic arch is not pass 5 but no acute findings are identified. Coronary artery calcification and aortic atherosclerotic calcification. No pericardial effusion. Mediastinum/Nodes: No axillary supraclavicular adenopathy. No mediastinal hilar adenopathy. No pericardial effusion Lungs/Pleura: . bilateral moderate pleural effusions. Mild bibasilar atelectasis. No pulmonary infarction or infiltrate identified. No pneumothorax. Musculoskeletal: No fracture Review of the MIP images confirms the above findings. CT ABDOMEN and PELVIS FINDINGS Severe body motion degrades imaging. Hepatobiliary: No focal hepatic lesion. No biliary duct dilatation. Gallbladder is normal. Common bile duct is normal. Pancreas: Pancreas is normal. No ductal dilatation. No pancreatic inflammation. Spleen: Normal spleen Adrenals/urinary  tract: Adrenal glands and kidneys are normal. The ureters and bladder normal. Stomach/Bowel: Stomach, small bowel, appendix, and cecum are normal. The colon and rectosigmoid colon are normal. Vascular/Lymphatic: Abdominal aorta is normal  caliber with atherosclerotic calcification. There is no retroperitoneal or periportal lymphadenopathy. No pelvic lymphadenopathy. Reproductive: Prostate normal Other: No free fluid. Musculoskeletal: No aggressive osseous lesion. Review of the MIP images confirms the above findings. IMPRESSION: 1. Exam is limited by patient body motion and respiratory motion. 2. No evidence acute pulmonary embolism. 3. Bilateral moderate size pleural effusions and associated basilar atelectasis. 4. No acute findings in the abdomen pelvis. 5.  Coronary artery and aortic Atherosclerosis (ICD10-I70.0). Electronically Signed   By: Genevive BiStewart  Edmunds M.D.   On: 08/29/2017 12:54   Dg Chest Port 1 View  Result Date: 08/23/2017 CLINICAL DATA:  57 year old male with shortness of breath, respiratory failure. Intubated. Moderate bilateral pleural effusions. EXAM: PORTABLE CHEST 1 VIEW COMPARISON:  Chest CT and radiographs 09/09/2017 and earlier. FINDINGS: Portable AP semi upright view at 0442 hrs. Endotracheal tube tip is in good position at the level the clavicles. Enteric tube remains looped in the left upper quadrant. Stable cardiac size and mediastinal contours. Continued veiling bilateral pulmonary opacity, appears increased compared to the portable film at 1246 hr yesterday but this might be related to changes in patient positioning. Pulmonary vascularity remains within normal limits. No pneumothorax. IMPRESSION: 1. Stable endotracheal tube and NG tube. 2. Stable versus increased bilateral pleural effusions since yesterday, with lower lobe collapse or consolidation. Electronically Signed   By: Odessa FlemingH  Hall M.D.   On: 002/01/2018 07:21   Dg Chest Port 1 View  Result Date: 08/29/2017 CLINICAL DATA:  Cyanosis. EXAM: PORTABLE CHEST 1 VIEW COMPARISON:  Earlier the same day FINDINGS: Interval intubation with endotracheal tube tip approximately 3 cm above the base of the carina. The NG tube passes into the stomach although the distal tip  position is not included on the film. Lung volumes are low. The cardio pericardial silhouette is enlarged. Interval progression of bilateral parahilar opacity suggests edema. Small bilateral pleural effusions again noted. The visualized bony structures of the thorax are intact. Telemetry leads overlie the chest. IMPRESSION: 1. Interval intubation with appropriate position of endotracheal tube by x-ray. 2. Lower lung volumes with progressive pulmonary edema pattern and small bilateral pleural effusions. Electronically Signed   By: Kennith CenterEric  Mansell M.D.   On: 09/08/2017 13:01     STUDIES:  February 21 CT chest abdomen pelvis with contrast images independently reviewed: No pulmonary embolism, bilateral pleural effusions, no acute abdominal process February 22 echocardiogram >   CULTURES: February 21 respiratory panel positive influenza A Feb 21 resp culture >   ANTIBIOTICS: February 21 Tamiflu Feb 22 > ceftriaxone  Feb 22 > azithro  SIGNIFICANT EVENTS:   LINES/TUBES: February 21 endotracheal tube   DISCUSSION: 57 year old male with a past medical history significant for diabetes mellitus was admitted on February 21 with acute respiratory failure with hypoxemia in the setting of influenza A.  On chest imaging he has evidence of congestive heart failure as well.  New acute kidney injury on 2/22, probably from IV contrast received on 2/21.  ASSESSMENT / PLAN:  PULMONARY A: Acute respiratory failure with hypoxemia Bilateral pleural effusions Likely acute pulmonary edema Severe community acquired pneumonia from influenza, possible bacterial superinfection?  P:   Continue full mechanical vent support Daily SBT/WUA VAP prevention protocol CXR daily  CARDIOVASCULAR A:  Likely congestive heart failure Heart murmur Mild hypotension> sedation  related P:  Follow up echocardiogram Stop propofol Diurese if BP improves Tele  RENAL A:   AKI > presumably due to IV contrast P:    Monitor BMET and UOP Replace electrolytes as needed Check urine sodium and creatinine > suspect contrast related ATN  GASTROINTESTINAL A:   Mild abdominal distention, CT negative for acute process P:   Continue tube feeding Continue pepcid  HEMATOLOGIC A:   Anemia without bleeding, known B12 def P:  Monitor for bleeding CBC prn  INFECTIOUS A:   Influenza A pneumonia Superimposed bacterial pneumonia? P:   Continue tamilflu Send respiratory culture Add antibiotics until resp culture back  ENDOCRINE A:   Hyperglycemia > poorly controlled P:   Increase sliding scale to resistant scale  NEUROLOGIC A:   Sedation need for ventilator synchrony P:   RASS -2 Stop propofol Use fentanyl drip and versed for sedation   FAMILY  - Updates: none bedside 2/22  - Inter-disciplinary family meet or Palliative Care meeting due by:  day 7  My cc time 32 minutes  Heber Glendon, MD  PCCM Pager: [phone] Cell: [phone] After 3pm or if no response, call [phone]    2017-10-03, 9:31 AM

## 2017-09-14 NOTE — Death Summary Note (Signed)
DEATH SUMMARY   Patient Details  Name: Stephen Fitzgerald MRN: [id number] DOB: [birth year]  Admission/Discharge Information   Admit Date:  Sep 14, 2017  Date of Death: Date of Death: 09-15-17  Time of Death: Time of Death: Nov 30, 1440  Length of Stay: 1  Referring Physician: Shirley, [place], DO   Reason(s) for Hospitalization  Shortness of breath  Diagnoses  Preliminary cause of death:   Influenza A Pneumonia Secondary Diagnoses (including complications and co-morbidities):  Active Problems:   Influenza A Acute systolic heart failure   Brief Hospital Course (including significant findings, care, treatment, and services provided and events leading to death)  Stephen Fitzgerald is a 57 y.o. year old male who was admitted to [facility] in the setting of shortness of breath.  He was diagnosed with influenza in the emergency department and required intubation therefore severe hypoxemic respiratory failure.  Clinical examination was consistent with volume overload as well so congestive heart failure was suspected.  He was treated with Tamiflu and mechanical ventilation in the intensive care unit.  On hospital day 2 an echocardiogram was performed which showed severe systolic dysfunction and findings worrisome for coronary artery disease with a prior infarct.  Not Stephen after the echocardiogram was performed he developed sudden cardiac arrest.  CPR was performed for over 40 minutes but the patient did not survive.    Pertinent Labs and Studies  Significant Diagnostic Studies Dg Chest 2 View  Result Date: 2017-09-14 CLINICAL DATA:  Shortness of breath. EXAM: CHEST  2 VIEW COMPARISON:  Radiograph of August 11, 2014. FINDINGS: Probable cardiomegaly is noted with central pulmonary vascular congestion. Hypoinflation of the lungs is noted with probable bibasilar subsegmental atelectasis. Bilateral perihilar edema cannot be excluded. Mild bilateral pleural effusions are noted. Bony thorax is  unremarkable. No pneumothorax is noted. IMPRESSION: Probable cardiomegaly with central pulmonary vascular congestion and bilateral pulmonary edema. Mild bilateral pleural effusions are noted. Hypoinflation of the lungs is noted with probable bibasilar subsegmental atelectasis. Electronically Signed   By: Lupita Raider, M.D.   On: 09-14-2017 10:44   Ct Angio Chest Pe W And/or Wo Contrast  Result Date: 2017-09-14 CLINICAL DATA:  Ongoing cough for a week. Short of breath. Abdominal distention. Patient difficulty maintaining stool position on CT scanner. Patient intubated after scan complete. EXAM: CT ANGIOGRAPHY CHEST CT ABDOMEN AND PELVIS WITH CONTRAST TECHNIQUE: Multidetector CT imaging of the chest was performed using the standard protocol during bolus administration of intravenous contrast. Multiplanar CT image reconstructions and MIPs were obtained to evaluate the vascular anatomy. Multidetector CT imaging of the abdomen and pelvis was performed using the standard protocol during bolus administration of intravenous contrast. CONTRAST:  100 mL Isovue. COMPARISON:  None. FINDINGS: CTA CHEST FINDINGS Cardiovascular: Exam is limited by patient body motion and respiratory motion. Additionally arms moving over chest. No filling defects within the proximal pulmonary arteries. The aortic arch is not pass 5 but no acute findings are identified. Coronary artery calcification and aortic atherosclerotic calcification. No pericardial effusion. Mediastinum/Nodes: No axillary supraclavicular adenopathy. No mediastinal hilar adenopathy. No pericardial effusion Lungs/Pleura: . bilateral moderate pleural effusions. Mild bibasilar atelectasis. No pulmonary infarction or infiltrate identified. No pneumothorax. Musculoskeletal: No fracture Review of the MIP images confirms the above findings. CT ABDOMEN and PELVIS FINDINGS Severe body motion degrades imaging. Hepatobiliary: No focal hepatic lesion. No biliary duct dilatation.  Gallbladder is normal. Common bile duct is normal. Pancreas: Pancreas is normal. No ductal dilatation. No pancreatic inflammation. Spleen: Normal spleen Adrenals/urinary tract:  Adrenal glands and kidneys are normal. The ureters and bladder normal. Stomach/Bowel: Stomach, small bowel, appendix, and cecum are normal. The colon and rectosigmoid colon are normal. Vascular/Lymphatic: Abdominal aorta is normal caliber with atherosclerotic calcification. There is no retroperitoneal or periportal lymphadenopathy. No pelvic lymphadenopathy. Reproductive: Prostate normal Other: No free fluid. Musculoskeletal: No aggressive osseous lesion. Review of the MIP images confirms the above findings. IMPRESSION: 1. Exam is limited by patient body motion and respiratory motion. 2. No evidence acute pulmonary embolism. 3. Bilateral moderate size pleural effusions and associated basilar atelectasis. 4. No acute findings in the abdomen pelvis. 5.  Coronary artery and aortic Atherosclerosis (ICD10-I70.0). Electronically Signed   By: Genevive BiStewart  Edmunds M.D.   On: 102/15/2019 12:54   Ct Abdomen Pelvis W Contrast  Result Date: 05/26/18 CLINICAL DATA:  Ongoing cough for a week. Short of breath. Abdominal distention. Patient difficulty maintaining stool position on CT scanner. Patient intubated after scan complete. EXAM: CT ANGIOGRAPHY CHEST CT ABDOMEN AND PELVIS WITH CONTRAST TECHNIQUE: Multidetector CT imaging of the chest was performed using the standard protocol during bolus administration of intravenous contrast. Multiplanar CT image reconstructions and MIPs were obtained to evaluate the vascular anatomy. Multidetector CT imaging of the abdomen and pelvis was performed using the standard protocol during bolus administration of intravenous contrast. CONTRAST:  100 mL Isovue. COMPARISON:  None. FINDINGS: CTA CHEST FINDINGS Cardiovascular: Exam is limited by patient body motion and respiratory motion. Additionally arms moving over chest.  No filling defects within the proximal pulmonary arteries. The aortic arch is not pass 5 but no acute findings are identified. Coronary artery calcification and aortic atherosclerotic calcification. No pericardial effusion. Mediastinum/Nodes: No axillary supraclavicular adenopathy. No mediastinal hilar adenopathy. No pericardial effusion Lungs/Pleura: . bilateral moderate pleural effusions. Mild bibasilar atelectasis. No pulmonary infarction or infiltrate identified. No pneumothorax. Musculoskeletal: No fracture Review of the MIP images confirms the above findings. CT ABDOMEN and PELVIS FINDINGS Severe body motion degrades imaging. Hepatobiliary: No focal hepatic lesion. No biliary duct dilatation. Gallbladder is normal. Common bile duct is normal. Pancreas: Pancreas is normal. No ductal dilatation. No pancreatic inflammation. Spleen: Normal spleen Adrenals/urinary tract: Adrenal glands and kidneys are normal. The ureters and bladder normal. Stomach/Bowel: Stomach, small bowel, appendix, and cecum are normal. The colon and rectosigmoid colon are normal. Vascular/Lymphatic: Abdominal aorta is normal caliber with atherosclerotic calcification. There is no retroperitoneal or periportal lymphadenopathy. No pelvic lymphadenopathy. Reproductive: Prostate normal Other: No free fluid. Musculoskeletal: No aggressive osseous lesion. Review of the MIP images confirms the above findings. IMPRESSION: 1. Exam is limited by patient body motion and respiratory motion. 2. No evidence acute pulmonary embolism. 3. Bilateral moderate size pleural effusions and associated basilar atelectasis. 4. No acute findings in the abdomen pelvis. 5.  Coronary artery and aortic Atherosclerosis (ICD10-I70.0). Electronically Signed   By: Genevive BiStewart  Edmunds M.D.   On: 102/07/2017 12:54   Dg Chest Port 1 View  Result Date: 09/13/2017 CLINICAL DATA:  57 year old male with shortness of breath, respiratory failure. Intubated. Moderate bilateral pleural  effusions. EXAM: PORTABLE CHEST 1 VIEW COMPARISON:  Chest CT and radiographs 07/07/2018 and earlier. FINDINGS: Portable AP semi upright view at 0442 hrs. Endotracheal tube tip is in good position at the level the clavicles. Enteric tube remains looped in the left upper quadrant. Stable cardiac size and mediastinal contours. Continued veiling bilateral pulmonary opacity, appears increased compared to the portable film at 1246 hr yesterday but this might be related to changes in patient positioning. Pulmonary vascularity remains within  normal limits. No pneumothorax. IMPRESSION: 1. Stable endotracheal tube and NG tube. 2. Stable versus increased bilateral pleural effusions since yesterday, with lower lobe collapse or consolidation. Electronically Signed   By: Odessa Fleming M.D.   On: 08/19/2017 07:21   Dg Chest Port 1 View  Result Date: 24-Sep-2017 CLINICAL DATA:  Cyanosis. EXAM: PORTABLE CHEST 1 VIEW COMPARISON:  Earlier the same day FINDINGS: Interval intubation with endotracheal tube tip approximately 3 cm above the base of the carina. The NG tube passes into the stomach although the distal tip position is not included on the film. Lung volumes are low. The cardio pericardial silhouette is enlarged. Interval progression of bilateral parahilar opacity suggests edema. Small bilateral pleural effusions again noted. The visualized bony structures of the thorax are intact. Telemetry leads overlie the chest. IMPRESSION: 1. Interval intubation with appropriate position of endotracheal tube by x-ray. 2. Lower lung volumes with progressive pulmonary edema pattern and small bilateral pleural effusions. Electronically Signed   By: Kennith Center M.D.   On: 09/24/2017 13:01    Microbiology Recent Results (from the past 240 hour(s))  Blood culture (routine x 2)     Status: None (Preliminary result)   Collection Time: 09-24-2017  9:59 AM  Result Value Ref Range Status   Specimen Description   Final    BLOOD RIGHT  FOREARM Performed at [facility], [address], [place], [place] [address]    Special Requests   Final    BOTTLES DRAWN AEROBIC AND ANAEROBIC Blood Culture adequate volume Performed at [facility], [address], [place], [place] [address]    Culture   Final    NO GROWTH 3 DAYS Performed at [facility], [address] [place], [place] [address]    Report Status PENDING  Incomplete  Blood culture (routine x 2)     Status: None (Preliminary result)   Collection Time: September 24, 2017  9:59 AM  Result Value Ref Range Status   Specimen Description   Final    BLOOD LEFT FOREARM Performed at [facility], [address], [place], [place] [address]    Special Requests   Final    BOTTLES DRAWN AEROBIC AND ANAEROBIC Blood Culture adequate volume Performed at [facility], [address], [place], [place] [address]    Culture   Final    NO GROWTH 3 DAYS Performed at [facility], [address] [place], [place] [address]    Report Status PENDING  Incomplete  Urine culture     Status: None   Collection Time: 2017/09/24  9:59 AM  Result Value Ref Range Status   Specimen Description URINE, RANDOM  Final   Special Requests   Final    NONE Performed at [facility], [address] [place], [place] [address]    Culture   Final    NO GROWTH Performed at [facility], [address] [place], [place] [address]    Report Status 09/08/2017 FINAL  Final  MRSA PCR Screening     Status: None   Collection Time: 2017-09-24  5:23 PM  Result Value Ref Range Status   MRSA by PCR NEGATIVE NEGATIVE Final    Comment:        The GeneXpert MRSA Assay (FDA approved for NASAL specimens only), is one component of a comprehensive MRSA colonization surveillance program. It is not intended to diagnose MRSA infection nor to guide or monitor  treatment for MRSA infections. Performed at [facility], [address], [place], [place] [address]   Culture, respiratory (NON-Expectorated)     Status: None   Collection Time: 08/31/2017 12:57 PM  Result Value Ref Range Status   Specimen Description   Final    TRACHEAL ASPIRATE Performed at [facility], [address], [place], [place] [address]    Special Requests   Final    NONE Performed at [facility], [address], [place], [place] [address]    Gram Stain   Final    ABUNDANT WBC PRESENT, PREDOMINANTLY PMN NO SQUAMOUS EPITHELIAL CELLS SEEN MODERATE GRAM POSITIVE COCCI IN CLUSTERS Performed at [facility], [address] [place], [place] [address]    Culture FEW STAPHYLOCOCCUS AUREUS  Final   Report Status 09/10/2017 FINAL  Final   Organism ID, Bacteria STAPHYLOCOCCUS AUREUS  Final      Susceptibility   Staphylococcus aureus - MIC*    CIPROFLOXACIN <=0.5 SENSITIVE Sensitive     ERYTHROMYCIN <=0.25 SENSITIVE Sensitive     GENTAMICIN <=0.5 SENSITIVE Sensitive     OXACILLIN <=0.25 SENSITIVE Sensitive     TETRACYCLINE <=1 SENSITIVE Sensitive     VANCOMYCIN <=0.5 SENSITIVE Sensitive     TRIMETH/SULFA <=10 SENSITIVE Sensitive     CLINDAMYCIN <=0.25 SENSITIVE Sensitive     RIFAMPIN <=0.5 SENSITIVE Sensitive     Inducible Clindamycin NEGATIVE Sensitive     * FEW STAPHYLOCOCCUS AUREUS    Lab Basic Metabolic Panel: Recent Labs  Lab September 13, 2017 0958 09-13-17 0959 09/13/17 1747 08/18/2017 0456  NA  --  137  --  136  K  --  5.0  --  5.2*  CL  --  106  --  109  CO2  --  20*  --  18*  GLUCOSE  --  154*  --  251*  BUN  --  19  --  30*  CREATININE 1.03 1.03  --  1.95*  CALCIUM  --  9.5  --  8.0*  MG  --   --  1.9 1.9  PHOS  --   --  4.7* 5.3*   Liver Function Tests: Recent Labs  Lab Sep 13, 2017 0959  AST 22  ALT 29  ALKPHOS 117  BILITOT 1.0  PROT 7.2  ALBUMIN 3.6   No results for input(s): LIPASE, AMYLASE in the last 168 hours. No results for input(s):  AMMONIA in the last 168 hours. CBC: Recent Labs  Lab 2017-09-13 0959 08/26/2017 0456  WBC 8.1 7.8  NEUTROABS 6.4  --   HGB 11.9* 10.6*  HCT 36.1* 32.8*  MCV 91.2 91.9  PLT 269 201   Cardiac Enzymes: Recent Labs  Lab 2017/09/13 0958  TROPONINI 0.05*   Sepsis Labs: Recent Labs  Lab Sep 13, 2017 0959 13-Sep-2017 1004 09/12/2017 0456  WBC 8.1  --  7.8  LATICACIDVEN  --  1.57  --     Procedures/Operations  Echocardiogram   Max Fickle 09/10/2017, 2:39 PM

## 2017-09-14 NOTE — Progress Notes (Signed)
Provided RN with MD ordered sample.

## 2017-09-14 NOTE — Progress Notes (Signed)
   2018/04/06 1600  Clinical Encounter Type  Visited With Family  Visit Type Initial;Psychological support;Spiritual support;Code;Death;Critical Care  Referral From Nurse  Consult/Referral To Chaplain  Spiritual Encounters  Spiritual Needs Emotional;Grief support;Other (Comment) (Spiritual Care Consult/Support)  Stress Factors  Patient Stress Factors Not reviewed  Family Stress Factors Loss;Major life changes   I provided grief support to the patient's father.  The patient's father was dealing with his own medical problems, and grief over the loss of the patient.   Chaplain Clint BolderBrittany Dillyn Menna M.Div., [facility]

## 2017-09-14 NOTE — Progress Notes (Signed)
Pt coded and  The ED physician called the code

## 2017-09-14 NOTE — Progress Notes (Signed)
RT called ABG results over to DR. Panchal  CCM due to results not being able to cross over in the system

## 2017-09-14 NOTE — Progress Notes (Signed)
  Echocardiogram 2D Echocardiogram has been performed.  Celene SkeenVijay  Erhardt Dada 09/09/2017, 3:05 PM

## 2017-09-14 NOTE — ED Provider Notes (Signed)
I was called to the room around 1:30 pm.  Patient apparently was having an echo performed at that time.  Patient bradycardia down to about 40 and subsequently lost the pulse.  CODE BLUE was called and went upstairs to the room.  Recognizing from yesterday, patient was intubated by me for flu and pneumonia.  ACLS initiated.  Patient was given multiple doses of atropine, bicarb, epinephrine.  Patient went into V. fib and V. tach and shocked 3 times.  He was also given amiodarone, lidocaine, magnesium, calcium and was unable to regain pulse for an hour. Dr. Kendrick FriesMcQuaid was called. Family notified.   2:30 pm I discussed with father regarding poor prognosis and the fact that we were unable to regain pulse. Dr. Wallace CullensGray from ICU present. He has no objection from us terminating the code.  2:42 pm Time of death. 2:42 pm. Patient has no pulse currently. Bedside US showed no cardiac activity. Since patient died within 24 hrs of admission from flu, pneumonia, nurse will notify medical examiner. Chaplain present with father.   CRITICAL CARE Performed by: Richardean Canalavid H Desiree Daise   Total critical care time: 60 minutes  Critical care time was exclusive of separately billable procedures and treating other patients.  Critical care was necessary to treat or prevent imminent or life-threatening deterioration.  Critical care was time spent personally by me on the following activities: development of treatment plan with patient and/or surrogate as well as nursing, discussions with consultants, evaluation of patient's response to treatment, examination of patient, obtaining history from patient or surrogate, ordering and performing treatments and interventions, ordering and review of laboratory studies, ordering and review of radiographic studies, pulse oximetry and re-evaluation of patient's condition.   Cardiopulmonary Resuscitation (CPR) Procedure Note Directed/Performed by: Richardean Canalavid H Walda Hertzog I personally directed ancillary staff and/or  performed CPR in an effort to regain return of spontaneous circulation and to maintain cardiac, neuro and systemic perfusion.     EMERGENCY DEPARTMENT US CARDIAC EXAM "Study: Limited Ultrasound of the Heart and Pericardium"  INDICATIONS:Cardiac arrest Multiple views of the heart and pericardium were obtained in real-time with a multi-frequency probe.  PERFORMED JX:BJYNWGBY:Myself IMAGES ARCHIVED?: No LIMITATIONS:  Body habitus VIEWS USED: Parasternal long axis INTERPRETATION: Cardiac activity absent      Charlynne PanderYao, Marquiz Sotelo Hsienta, MD 09/10/2017 [phone]

## 2017-09-14 NOTE — Progress Notes (Signed)
Fent 180 cc wasted in since with Christian RN

## 2017-09-14 NOTE — Code Documentation (Addendum)
Patient with echo tech at bedside perorming echo when patient began to brady in to 40s. Sharyn CreamerShawnda Rn in to see patient and perform pulse check. No pulse found, patient in PEA and code blue initiated promptly. See code sheet for details.Code terminated at 14:42. Patients father contacted and arrived to hospital prior to TOD. Medical examiner notified. Paitne r/o for ME case. [facility] notified. Dr Silverio LayYao pronounced patient s/p code.

## 2017-09-14 DEATH — deceased

## 2017-09-24 ENCOUNTER — Telehealth: Payer: Self-pay

## 2017-09-24 NOTE — Telephone Encounter (Signed)
On 09/24/17 [facility] The d/c is for burial. The patient is a patient of Doctor McQuaid. The d/c will be taken to Pulmonary Unit for signature.  On 09/27/17 I received the d/c back from Doctor McQuaid. I got the d/c ready and called the funeral home to let them know the d/c was mailed to vital records per the funeral home request.

## 2017-09-26 LAB — BLOOD GAS, ARTERIAL
ACID-BASE DEFICIT: 5.7 mmol/L — AB (ref 0.0–2.0)
Bicarbonate: 18.1 mmol/L — ABNORMAL LOW (ref 20.0–28.0)
Drawn by: 11249
FIO2: 60
LHR: 20 {breaths}/min
MECHVT: 620 mL
O2 SAT: 98.5 %
PEEP/CPAP: 5 cmH2O
Patient temperature: 101.3
pCO2 arterial: 34.3 mmHg (ref 32.0–48.0)
pH, Arterial: 7.352 (ref 7.350–7.450)
pO2, Arterial: 170 mmHg — ABNORMAL HIGH (ref 83.0–108.0)

## 2018-04-08 IMAGING — MR MR FOOT*L* WO/W CM
4 of 9 series · 19 of 40 positions shown · IV contrast (multihance)
Comparison: Radiographs dated 12/27/2016

CLINICAL DATA: Nonhealing transmetatarsal amputation site.

EXAM:
MRI OF THE LEFT FOOT WITHOUT AND WITH CONTRAST
TECHNIQUE: Multiplanar, multisequence MR imaging of the left foot was performed
both before and after administration of intravenous contrast.
CONTRAST:  20mL MULTIHANCE GADOBENATE DIMEGLUMINE 529 MG/ML IV SOLN

[Series 3: T1 · axial · 4.0mm · 0.33mm/px · z∈[-41,+104]mm · 5 of 30 slices shown (1 of 2)]
[im 1/30]
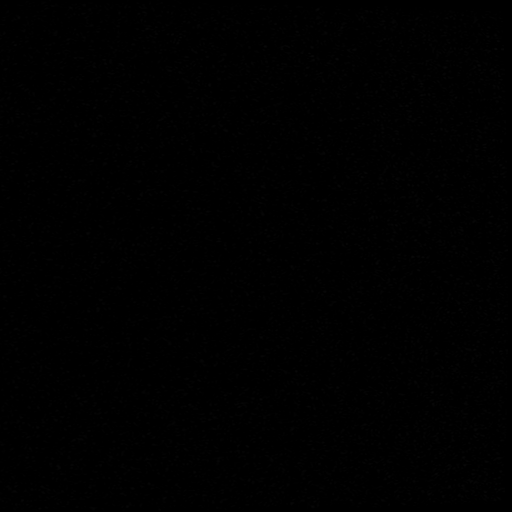
[im 8/30]
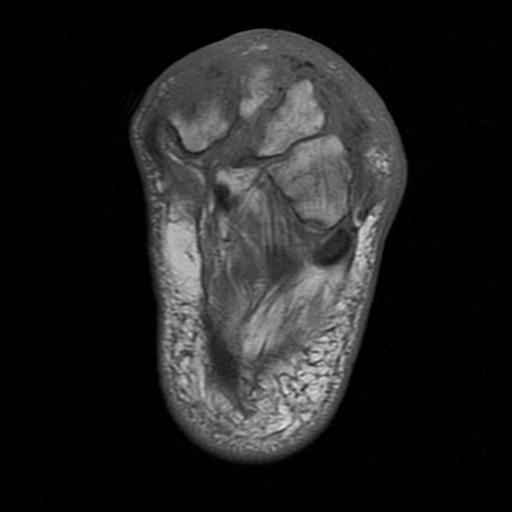
[im 15/30]
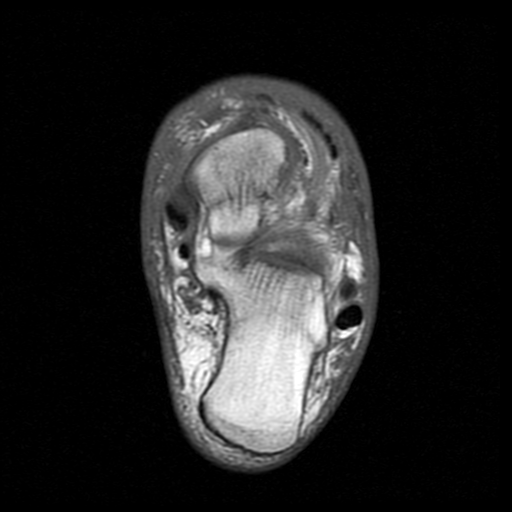
[im 22/30]
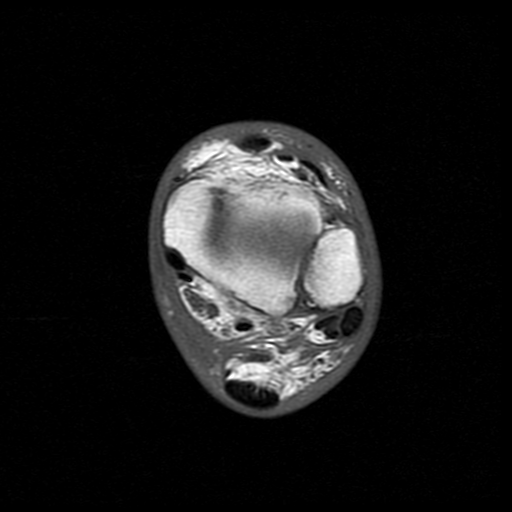
[im 30/30]
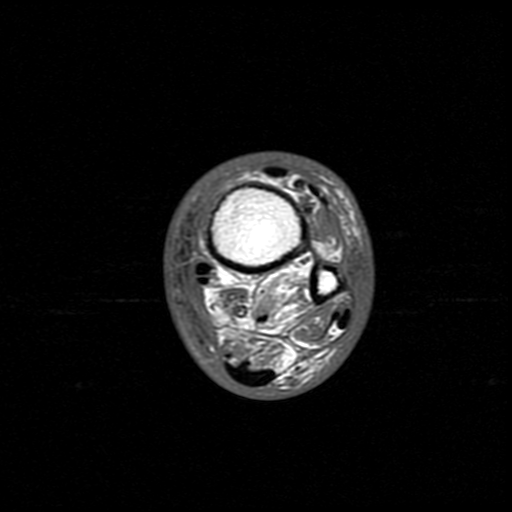

[Series 5: T1 · coronal · 4.0mm · 0.29mm/px · 5 of 34 slices shown (2 of 2)]
[im 1/34]
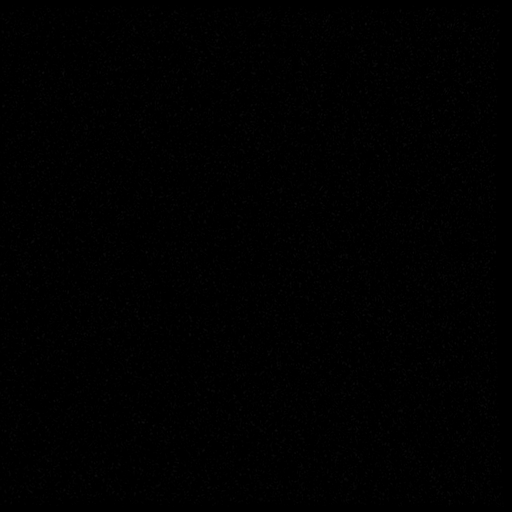
[im 9/34]
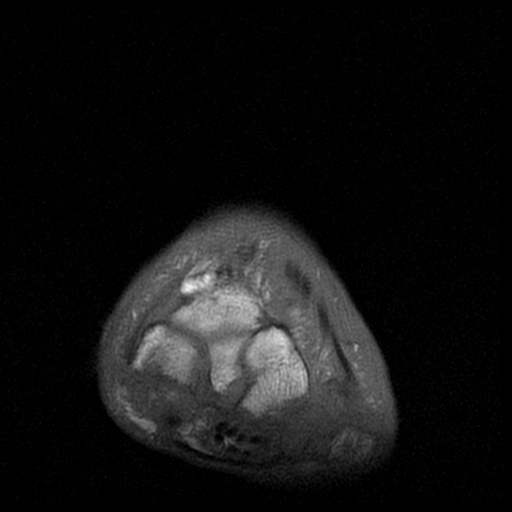
[im 17/34]
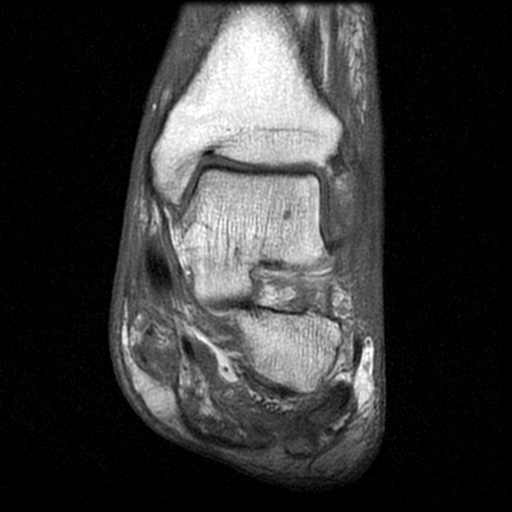
[im 25/34]
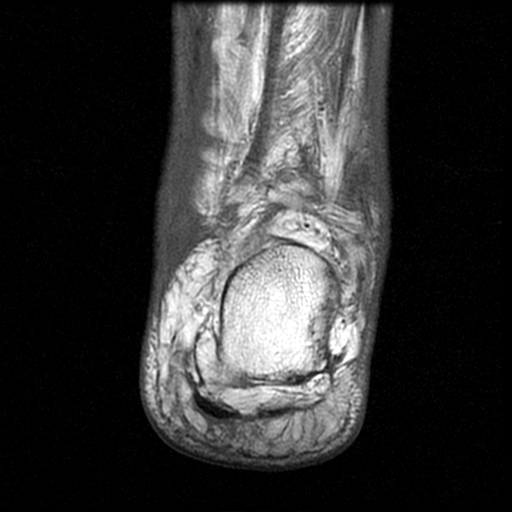
[im 34/34]
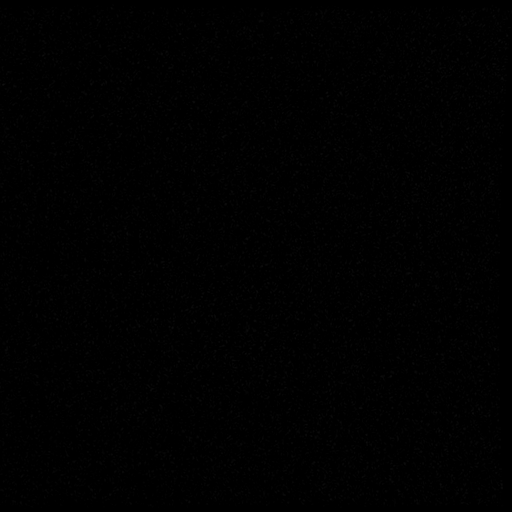

[Series 8: T1 fat-sat post-contrast · axial · 4.0mm · 0.33mm/px · z∈[-41,+104]mm · 5 of 30 slices shown]
[im 1/30]
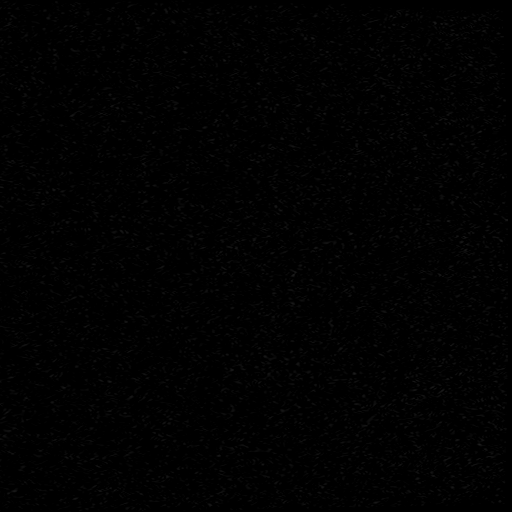
[im 8/30]
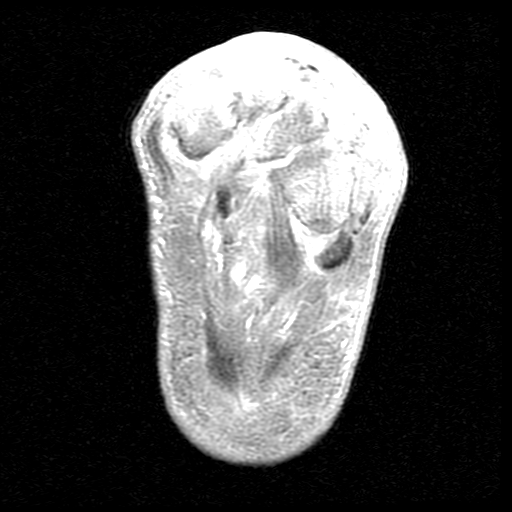
[im 15/30]
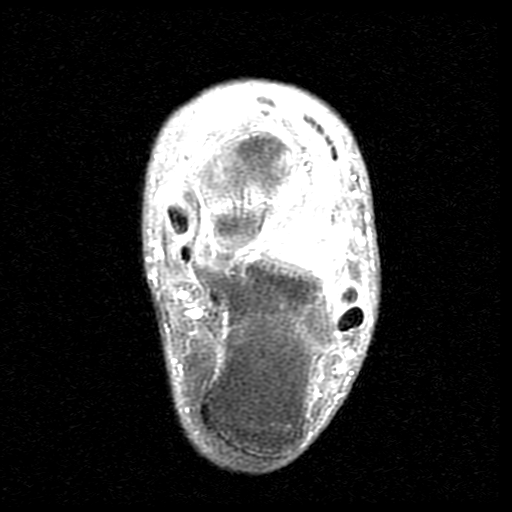
[im 22/30]
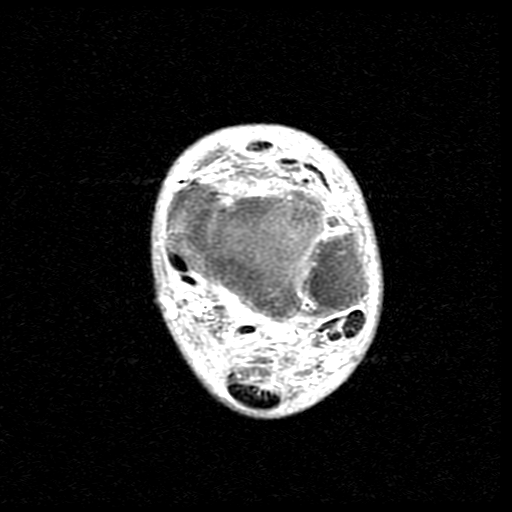
[im 30/30]
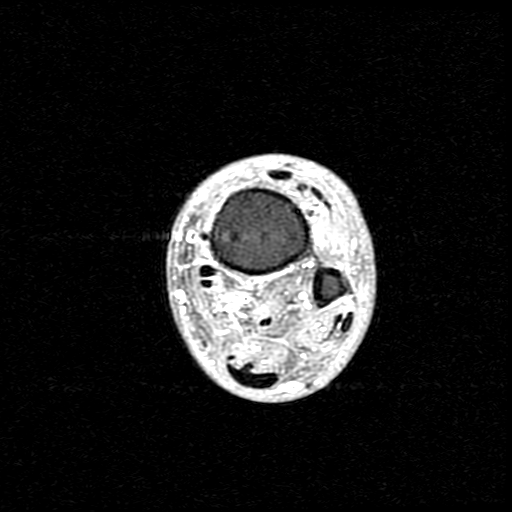

[Series 9: T1 post-contrast · coronal · 4.0mm · 0.29mm/px · 4 of 34 slices shown]
[im 1/34]
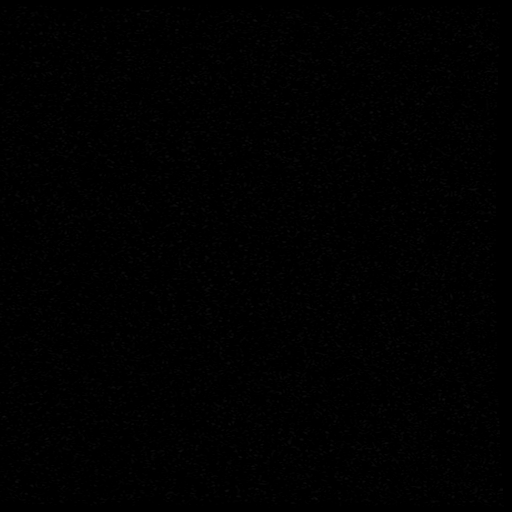
[im 9/34]
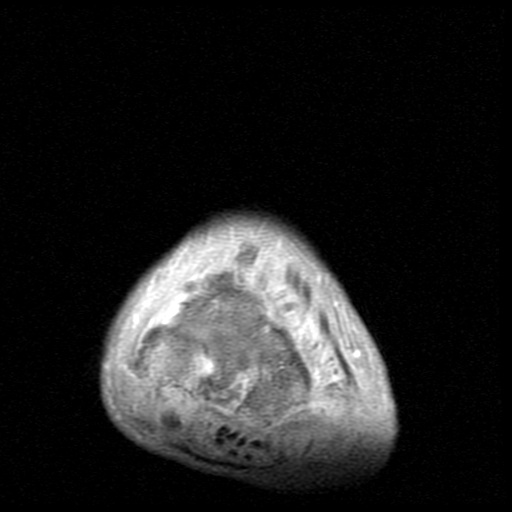
[im 17/34]
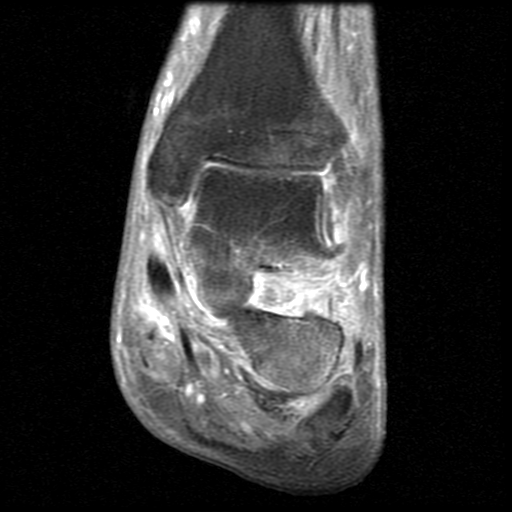
[im 34/34]
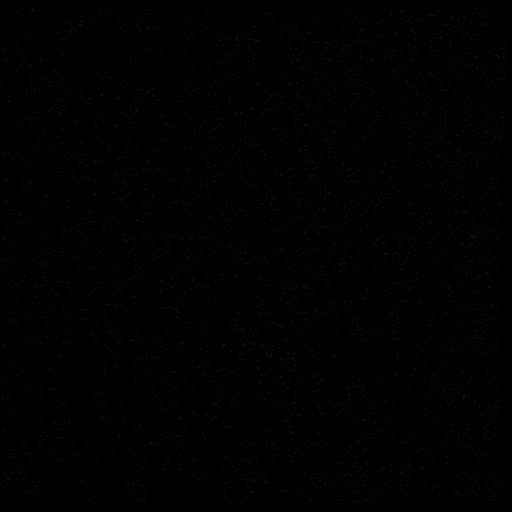

[19 of 40 positions shown; findings below may reference images not displayed]

FINDINGS: Bones/Joint/Cartilage

There is abnormal edema and abnormal enhancement of the distal
aspect the of the medial and middle cuneiforms with loss of the
cortical margin at the site of enhancement. This is at the site of
the overlying nonhealing wound. Findings are consistent with
osteomyelitis.

There is nonspecific edema in the navicular and cuboid and lateral
cuneiform and distal talus and distal calcaneus without significant
enhancement after contrast administration there is no definable
abscess. No joint effusions.
IMPRESSION: Findings consistent with osteomyelitis involving the distal aspects
of the middle and medial cuneiforms.
